# Patient Record
Sex: Male | Born: 1935 | Race: White | Hispanic: No | Marital: Married | Smoking: Former smoker
Health system: Southern US, Community
[De-identification: ages and names within clinical notes are randomized; demographics above are authoritative.]

## PROBLEM LIST (undated history)

## (undated) DIAGNOSIS — E785 Hyperlipidemia, unspecified: Secondary | ICD-10-CM

## (undated) DIAGNOSIS — I4891 Unspecified atrial fibrillation: Secondary | ICD-10-CM

## (undated) DIAGNOSIS — Z7901 Long term (current) use of anticoagulants: Secondary | ICD-10-CM

## (undated) DIAGNOSIS — Z66 Do not resuscitate: Secondary | ICD-10-CM

## (undated) DIAGNOSIS — N400 Enlarged prostate without lower urinary tract symptoms: Secondary | ICD-10-CM

## (undated) DIAGNOSIS — I495 Sick sinus syndrome: Secondary | ICD-10-CM

## (undated) DIAGNOSIS — E7800 Pure hypercholesterolemia, unspecified: Secondary | ICD-10-CM

## (undated) DIAGNOSIS — F0390 Unspecified dementia without behavioral disturbance: Secondary | ICD-10-CM

## (undated) DIAGNOSIS — H409 Unspecified glaucoma: Secondary | ICD-10-CM

## (undated) HISTORY — DX: Benign prostatic hyperplasia without lower urinary tract symptoms: N40.0

## (undated) HISTORY — PX: HERNIA REPAIR: SHX51

## (undated) HISTORY — DX: Unspecified atrial fibrillation: I48.91

## (undated) HISTORY — DX: Long term (current) use of anticoagulants: Z79.01

## (undated) HISTORY — DX: Sick sinus syndrome: I49.5

## (undated) HISTORY — DX: Hyperlipidemia, unspecified: E78.5

## (undated) HISTORY — PX: PACEMAKER INSERTION: SHX728

---

## 1984-08-02 HISTORY — PX: ABDOMINAL SURGERY: SHX537

## 2002-07-05 ENCOUNTER — Encounter: Payer: Self-pay | Admitting: General Surgery

## 2002-07-05 ENCOUNTER — Inpatient Hospital Stay (HOSPITAL_COMMUNITY): Admission: EM | Admit: 2002-07-05 | Discharge: 2002-07-20 | Payer: Self-pay | Admitting: Emergency Medicine

## 2002-07-05 ENCOUNTER — Encounter (INDEPENDENT_AMBULATORY_CARE_PROVIDER_SITE_OTHER): Payer: Self-pay | Admitting: Specialist

## 2002-07-06 ENCOUNTER — Encounter: Payer: Self-pay | Admitting: General Surgery

## 2002-07-09 ENCOUNTER — Encounter: Payer: Self-pay | Admitting: General Surgery

## 2002-07-11 ENCOUNTER — Encounter: Payer: Self-pay | Admitting: General Surgery

## 2002-07-13 ENCOUNTER — Encounter: Payer: Self-pay | Admitting: General Surgery

## 2002-07-14 ENCOUNTER — Encounter: Payer: Self-pay | Admitting: General Surgery

## 2002-07-16 ENCOUNTER — Encounter: Payer: Self-pay | Admitting: General Surgery

## 2003-03-14 ENCOUNTER — Encounter: Payer: Self-pay | Admitting: General Surgery

## 2003-03-14 ENCOUNTER — Inpatient Hospital Stay (HOSPITAL_COMMUNITY): Admission: RE | Admit: 2003-03-14 | Discharge: 2003-03-22 | Payer: Self-pay | Admitting: General Surgery

## 2003-03-19 ENCOUNTER — Encounter: Payer: Self-pay | Admitting: General Surgery

## 2005-08-02 DIAGNOSIS — I495 Sick sinus syndrome: Secondary | ICD-10-CM

## 2005-08-02 HISTORY — DX: Sick sinus syndrome: I49.5

## 2006-01-28 ENCOUNTER — Ambulatory Visit (HOSPITAL_COMMUNITY): Admission: RE | Admit: 2006-01-28 | Discharge: 2006-01-29 | Payer: Self-pay | Admitting: Cardiology

## 2008-11-13 ENCOUNTER — Emergency Department (HOSPITAL_COMMUNITY): Admission: EM | Admit: 2008-11-13 | Discharge: 2008-11-13 | Payer: Self-pay | Admitting: Emergency Medicine

## 2010-02-04 ENCOUNTER — Encounter: Payer: Self-pay | Admitting: Internal Medicine

## 2010-05-09 ENCOUNTER — Encounter: Payer: Self-pay | Admitting: Internal Medicine

## 2010-05-25 ENCOUNTER — Ambulatory Visit: Payer: Self-pay | Admitting: Internal Medicine

## 2010-07-01 ENCOUNTER — Encounter
Admission: RE | Admit: 2010-07-01 | Discharge: 2010-07-23 | Payer: Self-pay | Source: Home / Self Care | Attending: Orthopedic Surgery | Admitting: Orthopedic Surgery

## 2010-08-28 ENCOUNTER — Ambulatory Visit
Admission: RE | Admit: 2010-08-28 | Discharge: 2010-08-28 | Payer: Self-pay | Source: Home / Self Care | Attending: Internal Medicine | Admitting: Internal Medicine

## 2010-08-28 DIAGNOSIS — I4891 Unspecified atrial fibrillation: Secondary | ICD-10-CM | POA: Insufficient documentation

## 2010-08-28 DIAGNOSIS — I495 Sick sinus syndrome: Secondary | ICD-10-CM | POA: Insufficient documentation

## 2010-09-01 NOTE — Cardiovascular Report (Signed)
Summary: Office Visit   Office Visit   Imported By: Roderic Ovens 06/01/2010 16:07:21  _____________________________________________________________________  External Attachment:    Type:   Image     Comment:   External Document

## 2010-09-01 NOTE — Procedures (Signed)
Summary: pacer check/medtronic   Current Medications (verified): 1)  Digoxin 0.25 Mg Tabs (Digoxin) .... Take 1 Tablet By Mouth Once Daily 2)  Metoprolol Tartrate 25 Mg Tabs (Metoprolol Tartrate) .... Take 1 Tablet By Mouth Once Daily 3)  Simvastatin 40 Mg Tabs (Simvastatin) .... Take 1 Tablet By Mouth Once Daily 4)  Centrum Silver  Tabs (Multiple Vitamins-Minerals) .... Take 1 Tablet By Mouth Once Daily 5)  Fibercon 625 Mg Tabs (Calcium Polycarbophil) .... Take 1 Tablet By Mouth Once Daily 6)  Fish Oil 300 Mg Caps (Omega-3 Fatty Acids) .... Take 1 Capsule By Mouth Three Times A Day 7)  Aspir-Low 81 Mg Tbec (Aspirin) .... Take 1 Tablet By Mouth Once Daily  Allergies (verified): No Known Drug Allergies  PPM Specifications Following MD:  Hillis Range, MD     Referring MD:  Rolla Plate PPM Vendor:  Medtronic     PPM Model Number:  [id number]     PPM Serial Number:  [id number] PPM DOI:  01/28/2006     PPM Implanting MD:  Duffy Rhody Tennant,MD  Lead 1    Location: RV     DOI: 01/28/2006     Model #: 4470     Serial #: [id number]     Status: active  Magnet Response Rate:  BOL 85 ERI 65  Indications:  A-fib   PPM Follow Up Battery Voltage:  2.75 V     Battery Est. Longevity:  4 YRS       PPM Device Measurements Atrium  Threshold:   V at   msec Right Ventricle  Amplitude: 15.68 mV, Impedance: 524 ohms, Threshold: 0.50 V at 0.40 msec  Episodes MS Episodes:  0     Ventricular High Rate:  14     Atrial Pacing:  `     Ventricular Pacing:  77.3%  Parameters Mode:  VVIR     Lower Rate Limit:  60     Upper Rate Limit:  110 Next Cardiology Appt Due:  08/03/2010 Tech Comments:  14 VHR EPISODES--LONGEST WAS 27 SECONDS.  NORMAL DEVICE FUNCTION.  NO CHANGES MADE. ROV IN 3 MTHS W/JA.  PT ENROLLED IN [facility]--NEXT OV SCHEDULE FOR [facility] TRANSMISSION. Vella Kohler  May 25, 2010 10:13 AM

## 2010-09-01 NOTE — Miscellaneous (Signed)
Summary: Device preload  Clinical Lists Changes  Observations: Added new observation of PPM INDICATN: A-fib (05/09/2010 14:48) Added new observation of MAGNET RTE: BOL 85 ERI 65 (05/09/2010 14:48) Added new observation of PPMLEADSTAT1: active (05/09/2010 14:48) Added new observation of PPMLEADSER1: [id number]  (05/09/2010 14:48) Added new observation of PPMLEADMOD1: 4470  (05/09/2010 14:48) Added new observation of PPMLEADDOI1: 01/28/2006  (05/09/2010 14:48) Added new observation of PPMLEADLOC1: RV  (05/09/2010 14:48) Added new observation of PPM IMP MD: Duffy Rhody Tennant,MD  (05/09/2010 14:48) Added new observation of PPM DOI: 01/28/2006  (05/09/2010 14:48) Added new observation of PPM SERL#: [id number]  (05/09/2010 14:48) Added new observation of PPM MODL#: [id number]  (05/09/2010 84:69) Added new observation of PACEMAKERMFG: Medtronic  (05/09/2010 14:48) Added new observation of PPM REFER MD: Duffy Rhody Tennant,MD  (05/09/2010 14:48) Added new observation of PACEMAKER MD: Hillis Range, MD  (05/09/2010 14:48)      PPM Specifications Following MD:  Hillis Range, MD     Referring MD:  Rolla Plate PPM Vendor:  Medtronic     PPM Model Number:  [id number]     PPM Serial Number:  [id number] PPM DOI:  01/28/2006     PPM Implanting MD:  Rolla Plate  Lead 1    Location: RV     DOI: 01/28/2006     Model #: 4470     Serial #: [id number]     Status: active  Magnet Response Rate:  BOL 85 ERI 65  Indications:  A-fib

## 2010-09-03 NOTE — Assessment & Plan Note (Signed)
Summary: device/saf   Visit Type:  Pacemaker check Referring Provider:  Dr Deborah Chalk Primary Provider:  Dr Gilmore Laroche   History of Present Illness: [name] is a pleasant 77 yo WM with a h/o permanent atrial fibrillation and tachy/brady syndrome.  He reports doing very well s/p PPM.  Unfortunately, he has significant unsteadiness requiring a leg brace.  He has had multiple falls.  For this reason, he is not felt to be a candidate for coumadin. The patient denies symptoms of palpitations, chest pain, shortness of breath, orthopnea, PND, lower extremity edema,  presyncope, syncope, or neurologic sequela. The patient is tolerating medications without difficulties and is otherwise without complaint today.   Current Medications (verified): 1)  Digoxin 0.25 Mg Tabs (Digoxin) .... Take 1 Tablet By Mouth Once Daily 2)  Metoprolol Tartrate 25 Mg Tabs (Metoprolol Tartrate) .... Take 1 Tablet By Mouth Once Daily 3)  Simvastatin 40 Mg Tabs (Simvastatin) .... Take 1 Tablet By Mouth Once Daily 4)  Centrum Silver  Tabs (Multiple Vitamins-Minerals) .... Take 1 Tablet By Mouth Once Daily 5)  Fibercon 625 Mg Tabs (Calcium Polycarbophil) .... Take 1 Tablet By Mouth Once Daily 6)  Fish Oil 300 Mg Caps (Omega-3 Fatty Acids) .... Take 1 Capsule By Mouth Three Times A Day 7)  Aspir-Low 81 Mg Tbec (Aspirin) .... Take 1 Tablet By Mouth Once Daily 8)  Acetaminophen 650 Mg  Tbcr (Acetaminophen) .... 2 Tabs Two Times A Day  Allergies (verified): No Known Drug Allergies  Past History:  Past Medical History:  1.  Permanent  atrial fibrillation.  2.  coumadin stoppped due to falls/ unsteadiness.  3.  Remote history of bilateral breast tumors.  4.  Remote history of abdominal surgery in 1986.  5.  Generalized arthritis.  6.  Hyperlipidemia.  7.  History of hernia repair.  8. tachy/brady s/p PPM  Past Surgical History:  History of hernia repair Remote history of abdominal surgery in 1986. s/p PPM (MDT) by Dr  Deborah Chalk 2007  Social History: Reviewed history from 08/28/2010 and no changes required.  He is a retired Chart[employer] loss adjuster.  He has had no current  alcohol or tobacco use.  He does have a remote history of tobacco abuse, yet  none over the last 50 years.  Review of Systems       All systems are reviewed and negative except as listed in the HPI.   Vital Signs:  Patient profile:   77 year old male Height:      69 inches Weight:      163 pounds BMI:     24.16 Pulse rate:   65 / minute BP sitting:   122 / 66  (left arm) Cuff size:   regular  Vitals Entered By: Hardin Negus, RMA (August 28, 2010 11:08 AM)  Physical Exam  General:  thin elderly male, NAD Head:  normocephalic and atraumatic Eyes:  PERRLA/EOM intact; conjunctiva and lids normal. Mouth:  Teeth, gums and palate normal. Oral mucosa normal. Neck:  supple Chest Wall:  pacemaker pocket is well  healed Lungs:  Clear bilaterally to auscultation and percussion. Heart:  RRR (Paced) Abdomen:  Bowel sounds positive; abdomen soft and non-tender without masses, organomegaly, or hernias noted. No hepatosplenomegaly. Msk:  wears brace on leg Extremities:  No clubbing or cyanosis. Neurologic:  Alert and oriented x 3. Skin:  Intact without lesions or rashes.   PPM Specifications Following MD:  Hillis Range, MD     Referring MD:  Duffy Rhody  Tennant,MD PPM Vendor:  Medtronic     PPM Model Number:  [id number]     PPM Serial Number:  [id number] H PPM DOI:  01/28/2006     PPM Implanting MD:  Duffy Rhody Tennant,MD  Lead 1    Location: RV     DOI: 01/28/2006     Model #: 4470     Serial #: [id number]     Status: active  Magnet Response Rate:  BOL 85 ERI 65  Indications:  A-fib   PPM Follow Up Battery Voltage:  2.75 V     Battery Est. Longevity:  3 yrs     Right Ventricle  Amplitude: 11.20 mV, Impedance: 510 ohms, Threshold: 0.50 V at 0.40 msec  Episodes Ventricular High Rate:  2     Ventricular Pacing:  75.2%  Parameters Mode:  VVIR      Lower Rate Limit:  60     Upper Rate Limit:  110 Next Remote Date:  11/26/2010     Next Cardiology Appt Due:  08/03/2011 Tech Comments:  2 VHR EPISODE--LASTING 7 SECONDS.  NORMAL DEVICE FUNCTION.  NO CHANGES MADE. [facility] 11-26-10 AND ROV IN 12 MTHS W/JA. Vella Kohler  August 28, 2010 11:24 AM MD Comments:  agree [facility] are afib with RVR V rates are mostly controlled  Impression & Recommendations:  Problem # 1:  ATRIAL FIBRILLATION (ICD-427.31) stable not presently a coumadin candidate due to falls and unsteadiness V rates are stable  Problem # 2:  BRADYCARDIA-TACHYCARDIA SYNDROME (ICD-427.81) normal pacemaker function  no changes  Problem # 3:  HYPERLIPIDEMIA (ICD-272.4) stable     Simvastatin 40 Mg Tabs (Simvastatin) .Marland Kitchen... Take 1 tablet by mouth once daily  Patient Instructions: 1)  Your physician wants you to follow-up in: 12 months with Dr Jacquiline Doe will receive a reminder letter in the mail two months in advance. If you don't receive a letter, please call our office to schedule the follow-up appointment. 2)  Your physician recommends that you continue on your current medications as directed. Please refer to the Current Medication list given to you today. 3)  [facility] 11/26/10

## 2010-09-23 NOTE — Letter (Signed)
Summary: [facility]: Progress Note  [facility]: Progress Note   Imported By: Earl Many 09/14/2010 11:50:38  _____________________________________________________________________  External Attachment:    Type:   Image     Comment:   External Document

## 2010-11-11 LAB — CBC
Hemoglobin: 9.7 g/dL — ABNORMAL LOW (ref 13.0–17.0)
MCV: 98.5 fL (ref 78.0–100.0)
Platelets: 147 10*3/uL — ABNORMAL LOW (ref 150–400)
RBC: 2.93 MIL/uL — ABNORMAL LOW (ref 4.22–5.81)
RDW: 13.6 % (ref 11.5–15.5)

## 2010-11-11 LAB — POCT CARDIAC MARKERS: Myoglobin, poc: 99 ng/mL (ref 12–200)

## 2010-11-11 LAB — DIFFERENTIAL
Basophils Absolute: 0 10*3/uL (ref 0.0–0.1)
Eosinophils Relative: 1 % (ref 0–5)
Lymphocytes Relative: 5 % — ABNORMAL LOW (ref 12–46)
Monocytes Absolute: 0.5 10*3/uL (ref 0.1–1.0)
Monocytes Relative: 5 % (ref 3–12)
Neutro Abs: 8.4 10*3/uL — ABNORMAL HIGH (ref 1.7–7.7)

## 2010-11-11 LAB — TYPE AND SCREEN: ABO/RH(D): O POS

## 2010-11-11 LAB — BASIC METABOLIC PANEL
Calcium: 8.7 mg/dL (ref 8.4–10.5)
Creatinine, Ser: 1.16 mg/dL (ref 0.4–1.5)
Sodium: 141 mEq/L (ref 135–145)

## 2010-11-11 LAB — APTT: aPTT: 22 seconds — ABNORMAL LOW (ref 24–37)

## 2010-11-26 ENCOUNTER — Encounter: Payer: Self-pay | Admitting: *Deleted

## 2010-11-29 ENCOUNTER — Encounter: Payer: Self-pay | Admitting: *Deleted

## 2010-12-02 ENCOUNTER — Other Ambulatory Visit: Payer: Self-pay | Admitting: Internal Medicine

## 2010-12-03 ENCOUNTER — Ambulatory Visit (INDEPENDENT_AMBULATORY_CARE_PROVIDER_SITE_OTHER): Payer: Medicare Other | Admitting: *Deleted

## 2010-12-03 DIAGNOSIS — I4891 Unspecified atrial fibrillation: Secondary | ICD-10-CM

## 2010-12-03 DIAGNOSIS — I495 Sick sinus syndrome: Secondary | ICD-10-CM

## 2010-12-09 ENCOUNTER — Encounter: Payer: Self-pay | Admitting: *Deleted

## 2010-12-10 NOTE — Progress Notes (Signed)
Pacer remote check  

## 2010-12-18 NOTE — Cardiovascular Report (Signed)
NAMERASUL, DECOLA NO.:  [id number]   MEDICAL RECORD NO.:  [id number]          PATIENT TYPE:  OIB   LOCATION:  2807                         FACILITY:  [facility]   PHYSICIAN:  Colleen Can. Deborah Chalk, M.D.DATE OF BIRTH:  [birth year]   DATE OF PROCEDURE:  01/28/2006  DATE OF DISCHARGE:                              CARDIAC CATHETERIZATION   PROCEDURE:  Implantation of a single chamber pulse generator under  fluoroscopy.   INDICATION FOR PROCEDURE:  Chronic atrial fibrillation with intermittent  pauses greater than three seconds and bradycardia.   PROCEDURE:  The right subclavicular area was prepped and draped.  The area  was infiltrated with 1% Xylocaine.  A single puncture was made into the  right subclavian vein over top of the first rib.  Using a 7-French Cook  introducer, the ventricular lead was introduced.  The ventricular lead was a  Guidant model 4470 52 cm active fixation lead serial #[id number].  Thresholds were 0.6 volts to capture, 0.5 milliseconds pulse width.  R-waves  were 9.9 mV and impedance was 764 ohms.  Current was 0.9 MA.  The lead was  sutured in place.  The lead was placed on the septum.  We initially had two  different locations in the right ventricular apex but we had R-waves that  were less than 5 mV at that level and at this septum we had excellent  parameters on all aspects of the lead.  The lead was sutured in place.  The  wound was flushed with kanamycin solution.  The lead was then connected to a  Medtronic Adapta ADSRO1 serial [id number].  The unit was sutured in place.  The wound was closed with 2-0 and subsequently 4-0 Vicryl.  Steri-Strips  were applied.  The patient tolerated the procedure well.      Colleen Can. Deborah Chalk, M.D.  Electronically Signed     SNT/MEDQ  D:  01/28/2006  T:  01/28/2006  Job:  [id number]   cc:   [facility]

## 2010-12-18 NOTE — Op Note (Signed)
[name], [name]                       ACCOUNT NO.:  [id number]   MEDICAL RECORD NO.:  [id number]                   PATIENT TYPE:  INP   LOCATION:  5703                                 FACILITY:  [facility]   PHYSICIAN:  Jimmye Norman III, M.D.               DATE OF BIRTH:  [birth year]   DATE OF PROCEDURE:  03/15/2003  DATE OF DISCHARGE:                                 OPERATIVE REPORT   PREOPERATIVE DIAGNOSIS:  Recurrent right inguinal hernia.   POSTOPERATIVE DIAGNOSIS:  Recurrent direct right inguinal hernia.   PROCEDURE:  Repair of recurrent direct inguinal hernia using double-layered  Kugel mesh.   SURGEON:  Jimmye Norman, M.D.   ASSISTANT:  Gita Kudo, M.D.   ANESTHESIA:  General endotracheal.   ESTIMATED BLOOD LOSS:  50 mL.   COMPLICATIONS:  None.   CONDITION:  Stable.   FINDINGS:  The patient had a large recurrent direct hernia, which dissected  down into the subcu on the right inguinal area.  He had no incarcerated  bowel.  The spermatic cord appeared to be intact and viable.  Previous  repair, this had just bulged through the previous repair, and there was no  mesh from the previous repair.   INDICATION FOR OPERATION:  The patient is a 77 year old with significant  cardiac disease, who is on Coumadin chronically, who had a previous  incarcerated hernia repaired with some difficulty.  No mesh was implanted at  that time, but he was taken to the operating room now for repair.   OPERATION:  The patient was taken to the operating room and placed on the  table in the supine position.  After an adequate endotracheal anesthetic was  administered, he was prepped and draped in the usual sterile manner,  exposing the right groin.   We went through the previous right diagonal incision over the hernia on the  right side.  We took it down into the subcu and then subsequently down to  the level of the deep scar tissue and the recurrent hernia using  electrocautery  primarily.  We entered a small area laterally of the hernia  sac, which allowed [place] to dissect out the sac from the surrounding  subcutaneous tissue and then subsequently more medially from the spermatic  cord.  We enclosed the spermatic cord in a Penrose drain and as we retracted  away, we dissected away from the hernia sac completely.   We circumferentially dissected away the hernia sac from the surrounding  subcutaneous tissue and then once we had done so,we resected the sac and  sutured it closed using a running 0 Prolene suture.  We then detached the  preperitoneal area of the hernia sac from the underlying conjoined tendon  superomedially and what was left of the inferolateral ligament using  Metzenbaum scissors and electrocautery.  This allowed [place] to develop a  circumferential plane underneath the spermatic cord  in which the double-  layered Kugel mesh was placed.  We attached it to Cooper's ligament sort of  inferomedially away from those vessels as we palpated the vessels during the  repair.  It was tacked down using an 0 Prolene suture.  We then cut off the  loop or the tag of the double mesh and then tacked it down superomedially  using the 0 Prolene suture, attaching it to the transversalis fascia and  superolaterally, also with an 0 Prolene suture.  We poured some antibiotic  ointment into the canal to provide for some coverage during that.  Then we  closed the conjoined tendon down to scarred tendon down inferolaterally  using a running 0 Prolene suture.  Once this was done we placed an onlay  piece of mesh on top of the repaired fascial closure using tacking sutures  of 0 Prolene.  This was done in four quadrants, tacking it to the pubic  tubercle medially.  When this was done we closed the external oblique fascia  and scar on top of the second layer of mesh using a running 2-0 Vicryl  suture, and then Scarpa's fascia was reapproximated using interrupted 3-0  Vicryl sutures.   We injected 0.25% Marcaine without epinephrine into the  subcutaneous tissue and into the wound, closing the skin using a running  subcuticular stitch of 4-0 Vicryl.  All needle counts, sponge counts, and  instrument counts were correct.  The patient was taken to the recovery room  in stable condition.  Sterile dressing was applied.                                               Kathrin Ruddy, M.D.    JW/MEDQ  D:  03/15/2003  T:  03/15/2003  Job:  [id number]

## 2010-12-18 NOTE — Op Note (Signed)
[name], [name]                       ACCOUNT NO.:  [id number]   MEDICAL RECORD NO.:  [id number]                   PATIENT TYPE:  INP   LOCATION:  3305                                 FACILITY:  [facility]   PHYSICIAN:  Jimmye Norman III, M.D.               DATE OF BIRTH:  [birth year]   DATE OF PROCEDURE:  07/05/2002  DATE OF DISCHARGE:                                 OPERATIVE REPORT   PREOPERATIVE DIAGNOSIS:  Incarcerated right inguinal hernia with possible  infarcted bowel.   POSTOPERATIVE DIAGNOSIS:  Incarcerated right inguinal hernia with ischemic  and infarcted small bowel.   PROCEDURE:  1. Small bowel resection with anastomosis.  2. Right inguinal hernia repair without mesh, modified McVay's type repair.   SURGEON:  Jimmye Norman, M.D.   ASSISTANT:  Gabrielle Dare. Janee Morn, M.D.   ANESTHESIA:  General.   ESTIMATED BLOOD LOSS:  100 to 150 cc.   COMPLICATIONS:  Rupture of dilated ischemic small bowel.   CONDITION:  Fair.   SPECIMENS:  Resected small bowel which was infarcted.   DISPOSITION:  To PACU then to 3300 or intermediate care unit.   INDICATIONS FOR PROCEDURE:  The patient is a 76 year old gentleman with a  significant cardiac history on digoxin, multiple antihypertensives, and  Coumadin who came in with an INR of over 2 and an incarcerated right  inguinal hernia which could not be reduced in the emergency department.  He  was admitted and the anticoagulation was reversed as we prepared him for  surgical resection.   FINDINGS:  The patient had a distal knuckle of distal small bowel which was  completely infarcted and trapped in the right inguinal hernia which was a  direct type hernia lateral to the spermatic cord.  The fascial planes were  not well defined during the procedure as it seems as though the patient had  had a repair where the spermatic cord was left above the external oblique  fascia.  There was no mesh identified during this repair.  The small  bowel  involved was distal small bowel, likely proximal ileum.   OPERATION:  The patient was taken to the operating room and placed on the  table in supine position.  After an adequate endotracheal anesthetic was  administered, he was prepped and draped in the usual sterile manner exposing  the midline and the right lower quadrant.  We made an incision paralleling  what would have been Poupart's ligament over in the right lower quadrant  down into the subcutaneous tissue where we subsequently ran into the large  bulging hernia which you could see through the soft tissue was ecchymotic.  We dissected out the hernia sac itself and found that it was a large sac  measuring approximately 8 cm in size with some necrotic bloody fluid  contained within the sac, itself.  We opened the sac and found there was an  ischemic, almost completely  infarcted knuckle of small bowel which we had to  resect.  We opened the hernia for the full extent of the incision actually  opening it down almost to the internal ring, however, care had to be taken  to not lose control of the inferior epigastric vessels or damage the femoral  vessels.  We could palpate the femoral vessels inferolaterally and ligate  the inferior epigastrics between [employer] clamps and 2-0 silk ties.  Once this  was done, we could open up the inguinal floor which also had opened peroneum  with the infarcted bowel contained within.   We had to mobilize the small bowel into the wound through the exposed hernia  and the hernia sac.  There were multiple adhesions keeping the small bowel  tethered down into the peritoneum and as we were trying to mobilize this  into the wound to perform a resection and a primary anastomosis, the small  bowel tore in an infarcted area and spilled some not infected smelling but  thickened secretions into the wound.  We aspirated this quickly and then  subsequently gained more proximal control having to remove more  proximal  loops and open up some adhesions.  We subsequently resected approximately 8  inches of small bowel and did a handsewn anastomosis using 0 silk Lembert  stitches, 3-0 Vicryl full thickness Connell stitches, and then an anterior  border of 3-0 silk Lembert stitches.  The mesentery was closed using 3-0  silk sutures.  We then allowed the anastomosed bowel to fall back into the  peritoneal cavity where we subsequently irrigated with a liter of warm  saline solution.  We then closed the peritoneum using a running 3-0 Vicryl  suture just to keep the bowel contents within.  However, there were, again,  very few fascial planes to identify exactly where we would sew, however, we  could identify the conjoined tendon anteromedially which we did suture down  to on the medial aspect to Coopers ligament using interrupted #1 Prolene  sutures.  We took that conjoined tendon to Coopers ligament up to close to  the femoral vessels and then subsequently made a transition patching the  conjoined tendon and also probably part of the external oblique fascia to  what appeared to be residual Poupart's ligament lateral to the femoral  vessels.  This was also done with interrupted #1 Prolene sutures.   Once we did this part of the repair, we copiously irrigated with saline,  reapproximated the Scarpa's fascia using interrupted 3-0 Vicryl and then the  skin was closed loosely using stainless steel staples.  Antibiotic ointment  was applied to the wound and a sterile dressing applied.  We subsequently  tried to pass an NG tube in the patient which was unsuccessful.  We stopped  because of fear of bleeding.                                               Kathrin Ruddy, M.D.    JW/MEDQ  D:  07/05/2002  T:  07/05/2002  Job:  [id number]

## 2010-12-18 NOTE — H&P (Signed)
NAMEFRANK, NOVELO NO.:  [id number]   MEDICAL RECORD NO.:  [id number]            PATIENT TYPE:   LOCATION:                                 FACILITY:   PHYSICIAN:  Colleen Can. Deborah Chalk, M.D.    DATE OF BIRTH:   DATE OF ADMISSION:  01/28/2006  DATE OF DISCHARGE:                                HISTORY & PHYSICAL   CHIEF COMPLAINT:  None.   HISTORY OF PRESENT ILLNESS:  Mr. Vanderwoude is a very pleasant 77 year old white  male.  He has had longstanding atrial fibrillation, has been maintained on  chronic Coumadin with rate control.  He has had some degree of unsteadiness  and fatigue, but overall has been doing well.  He has had no frank syncope,  no complaints of chest pain or shortness of breath.  He had a recent Holter  monitor performed which showed profound pauses with periods also of  increased heart rate.  Overall, he is felt to have tachybrady syndrome.  He  is now referred for pacemaker implantation.   PAST MEDICAL HISTORY:  1.  Chronic atrial fibrillation.  2.  Chronic Coumadin.  3.  Remote history of bilateral breast tumors.  4.  Remote history of abdominal surgery in 1986.  5.  Generalized arthritis.  6.  Hyperlipidemia.  7.  History of hernia repair.   ALLERGIES:  None.   CURRENT MEDICATIONS:  1.  Simvastatin 80 mg a day.  2.  Tylenol p.r.n.  3.  Glucosamine p.r.n.  4.  Fish oil three times a day.  5.  Fibercon daily.  6.  Multivitamin daily.  7.  Toprol XL 25 mg a day.  8.  Lopid 600 b.i.d.  9.  Coumadin as directed.  10. Lanoxin 0.25 daily.   FAMILY HISTORY:  Father died at 77 with questionable heart attack.  Mother  died at age 50.   SOCIAL HISTORY:  He is a retired Chartered loss adjuster.  He has had no current  alcohol or tobacco use.  He does have a remote history of tobacco abuse, yet  none over the last 50 years.   REVIEW OF SYSTEMS:  Basically as noted above and is otherwise unremarkable.   PHYSICAL EXAMINATION:  GENERAL:  He is a very  pleasant, elderly white male  in no acute distress.  VITAL SIGNS:  Weight is 184 pounds, blood pressure 160/80 sitting, 150/80  standing.  Heart rate is 100 and irregular, respirations 18.  He is  afebrile.  SKIN:  Warm and dry.  Color is unremarkable.  LUNGS:  Clear.  HEART:  Irregular irregular rhythm.  ABDOMEN:  Soft.  Positive bowel sounds.  Nontender.  EXTREMITIES:  Edema.  NEUROLOGIC:  Intact.  There are no gross focal deficits.   PERTINENT LABORATORIES:  His EKG shows atrial fibrillation.  His INR is  2.34.  CBC is normal.  Chemistries are normal except for a BUN of 23.  His  potassium is 4.6.   OVERALL IMPRESSION:  1.  Chronic atrial fibrillation now demonstrating tachybrady syndrome with      profound pauses on recent  Holter monitor.  2.  Chronic Coumadin.  3.  Hyperlipidemia.   PLAN:  We will proceed on with single chamber pacemaker implantation.  The  procedure has been reviewed in full detail with both him and his wife  including the risks and benefits and he is willing to proceed on Friday,  January 28, 2006.      Sharlee Blew, N.P.      Colleen Can. Deborah Chalk, M.D.  Electronically Signed    LC/MEDQ  D:  01/26/2006  T:  01/26/2006  Job:  [id number]

## 2010-12-18 NOTE — Discharge Summary (Signed)
[name], [name] NO.:  [id number]   MEDICAL RECORD NO.:  [id number]          PATIENT TYPE:  OIB   LOCATION:  2018                         FACILITY:  [facility]   PHYSICIAN:  Sharlee Blew, N.P.  DATE OF BIRTH:  06-16-1923   DATE OF ADMISSION:  01/28/2006  DATE OF DISCHARGE:                                 DISCHARGE SUMMARY   PRIMARY DISCHARGE DIAGNOSIS:  Chronic atrial fibrillation with tachy-brady  syndrome status post single chamber pacemaker implantation with an Adapta  ADSRO 1 serial number [id number].   SECONDARY DISCHARGE DIAGNOSES:  1.  Chronic atrial fibrillation.  2.  Chronic Coumadin.  3.  Hyperlipidemia.   HISTORY OF PRESENT ILLNESS:  The patient is a very pleasant 77 year old  white male.  He has had a longstanding history of atrial fibrillation.  He  has been maintained on chronic Coumadin with rate control.  He has had a  recent Holter monitor which shows significant pauses as well as some periods  of increased heart rate.  Overall he was felt to have tachy-brady syndrome  and he was referred for single chamber pacemaker implantation.  He has had  no frank syncope and has basically been asymptomatic. Please see the History  and Physical for further patient presentation and profile.   LABORATORY DATA ON ADMISSION:  His INR was 2.34 and after Coumadin was held  for 3 days he was down to 1.2.  His CBC was normal.  His chemistries were  normal except for a BUN of 23.   HOSPITAL COURSE:  The patient was admitted electively on January 28, 2006 for  single chamber pacemaker implantation.  That procedure was performed without  any known complications.  A Medtronic single chamber ADSR01 serial number  [id number] was implanted.  The overall procedure was tolerated well and  postprocedure he was transferred to 2000.  Today on January 29, 2006 he is  doing well without complaints.  The pacemaker wound is satisfactory.  He is  currently V-paced with underlying  atrial fibrillation.  He is felt to be a  satisfactory candidate for discharge today.   DISCHARGE CONDITION:  Stable.   DISCHARGE MEDICATIONS:  He will resume all of his previous home medicines as  he was taking before which includes:  1.  Lanoxin 0.25 daily.  2.  Lopid 600 b.i.d.  3.  Toprol XL 25 mg a day.  4.  Multivitamin daily.  5.  Zocor 80 a day.  6.  Coumadin 2 mg on Sundays, 1-1/2 mg the other 6 days.  7.  He is to use Tylenol as need for discomfort.   DISCHARGE INSTRUCTIONS:  Extensive written instructions are given regarding  pacemaker care specifically not to raise the right arm above his head for  the next two weeks.  He is to avoid getting the wound wet for the first week  as well.  Betadine swabs will be made available to him to paint the wound  twice a day for the next 3 days.  We will plan on following up with him in  the office in approximately 10 days.  Certainly sooner if problems arise.      Sharlee Blew, N.P.     LC/MEDQ  D:  01/29/2006  T:  01/29/2006  Job:  [phone]   cc:   [facility]  [address] [place], [place] [address]

## 2010-12-18 NOTE — Discharge Summary (Signed)
[name], [name]                       ACCOUNT NO.:  [id number]   MEDICAL RECORD NO.:  [id number]                   PATIENT TYPE:  INP   LOCATION:  3709                                 FACILITY:  [facility]   PHYSICIAN:  Jimmye Norman III, M.D.               DATE OF BIRTH:  1923-04-03   DATE OF ADMISSION:  07/04/2002  DATE OF DISCHARGE:  07/20/2002                                 DISCHARGE SUMMARY   DISCHARGE DIAGNOSES:  1. Strangulating obstructing hernia in the right groin.  2. Postoperative ileus.  3. Atrial fibrillation.  4. Clostridium difficile colonic infection.  5. Urinary tract infection with Pseudomonas.   PRINCIPAL PROCEDURE:  Right inguinal hernia repair without mesh, and a small  bowel resection.   SURGEON:  Jimmye Norman, M.D.   DISCHARGE MEDICATIONS:  1. Ciprofloxacin for five days after discharge.  2. Flagyl for an additional five days after discharge.   CONDITION ON DISCHARGE:  Stable.   HOSPITAL COURSE:  The patient was admitted on July 04, 2002, with an  incarcerated right inguinal hernia.  He was on Coumadin and his INR was at  an unacceptable level for approximately 12-18 hours prior to surgery, at  which time he did receive 4 units of FFP and some vitamin K.  He  subsequently underwent an operation, at which time he had to have a small  bowel resection performed because of ischemia and infarction of an 8 cm  anterior segment of small bowel.  Postoperatively the patient was off his  anticoagulation and developed a profound abdominal ileus, requiring the  placement of an NG tube, which could not be passed at the time of surgery.  His hernia was healing well and he had no evidence of hematoma formation  actually until he actually had to be started back on heparin because of his  atrial fibrillation.  This was done on postoperative day number seven.  At  that time the patient had been on TNA since the first postoperative day, at  which time he had a PIC  line placed for TNA.  He continued to be on  nutrition throughout his course of abdominal ileus, which resolved on  postoperative day number seven, at which time he started to take liquids.  The NG tube was removed after clamping it for 24 hours, with success.  It  was noted that the patient had prominent diarrhea postoperatively.  C.  difficile toxin titer was sent which was positive.  He was started on oral  and IV Flagyl, with the IV Flagyl being subsequently discontinued.  He  continues to be on Flagyl and he will complete a 12-day course of the  antibiotic.  He was also noted to have fevers and white counts escalating  after the initial defervescence, with a white count up to 24,000.  Currently  on the day of discharge it is down to 15.8, but he does still  have a low  shift and some mild anemia, possibly secondary to bleeding into his groin  from being on heparin.  At the time the fever workup demonstrated a gram-  negative Pseudomonas urinary tract infection.  He was started on appropriate  antibiotics.  The patient did require 2 units of blood postoperatively,  possibly because of bleeding into his groin and scrotum area from having to  be on heparin.  That has stabilized and he should take iron and vitamins at  home, to continue to replace his blood products.    DISPOSITION:  He will be discharged home in the care of his family.  Cardiology service, Dr. Colleen Can. Deborah Chalk, has seen the patient during the  hospitalization.  They will follow his prothrombin time at home.  He will  start on the dose that he was taking prior to admission.   FOLLOW UP:  I will see him in two weeks, otherwise there are no other  changes.                                                Kathrin Ruddy, M.D.    JW/MEDQ  D:  07/20/2002  T:  07/21/2002  Job:  [id number]

## 2010-12-18 NOTE — Discharge Summary (Signed)
[name], [name]                       ACCOUNT NO.:  [id number]   MEDICAL RECORD NO.:  [id number]                   PATIENT TYPE:  INP   LOCATION:  5703                                 FACILITY:  [facility]   PHYSICIAN:  Jimmye Norman III, M.D.               DATE OF BIRTH:  07-Dec-1922   DATE OF ADMISSION:  03/14/2003  DATE OF DISCHARGE:  03/22/2003                                 DISCHARGE SUMMARY   DISCHARGE DIAGNOSES:  1. Recurrent right inguinal hernia.  2. Postoperative hemorrhage with hematoma in the right groin, right flank     and the perineal scrotal area.  3. Atrial fibrillation.  4. Coagulopathy.   DISCHARGE MEDICATIONS:  Patient is to restart his Coumadin as he was taking  previously, which is 2.5 mg a day.   DIET:  Regular.   CONDITION ON DISCHARGE:  Stable. He has had bowel movements prior to  discharge.   FOLLOW UP:  He is to return to see me on Tuesday, August 24th. We will  recheck his wound at that time.  He can shower when he gets home, but no  heavy lifting.   HOSPITAL COURSE:  The patient was brought in the day prior to his surgical  procedure for correction of his coagulopathy, secondary to Coumadin therapy.  On admission, he had an INR of greater than 2.  He was given 2 units of FFP  and 10 mg of subcutaneous vitamin K prior to surgery on the following day.  His INR was 1.4 the day of surgery, PTT was 42.  His heparin had been cut  off.  He went to surgery, at which time he had repair of recurrent right  inguinal hernia which had very difficult tissues to sew onto. A Kugel double  layered patch of mesh was placed in the wound. Because of the recurrent  nature, there was a fair amount of bleeding at the time, which actually was  exacerbated postoperatively because of his coagulopathy, and the need to try  to place the patient back on heparin.   Orders were written to hold the heparin until the morning following the  procedure.  He did have  significant blood loss postoperatively. His  hemoglobin originally dropped down into the 9 range, and then subsequently  went down to 8.5.  At that time, he received 2 units of blood, and he has  not received any further blood since that time. His hemoglobin subsequently  came up to 10, and then up to 11.5; which is the hemoglobin today. He was  started back on heparin on postoperative day #3.  Inspite of that, his  hemoglobin has remained fine once he has received blood. He has only  received one 5 mg tablet of Coumadin.  He is to resume his Coumadin therapy  which he was receiving at home.   His platelet counts have improved considerably.  He has had  a considerable  amount of discomfort secondary to the hematoma, which has been managed with  IV pain medicine and also Vicodin. There was no neuropathic type pain, and  this should resolve once the significant hematoma in the right groin, right  flank and the scrotum has resolved.  No compressive devices are necessary,  we have just supported it with scrotal support.   He has dissolvable 4-0 Vicryl stitches, which do not require removal. At  this time, he is to visit me on Tuesday, just to observe the hematoma and  his wound.   The day of discharge, we are checking his INR.  Hopefully, that will be well  enough for him to go home and not too high.  He is to resume Coumadin as  taken previously.  He is to call me for any problems that he should have  over the weekend. He was given Vicodin to take home.                                                Kathrin Ruddy, M.D.    JW/MEDQ  D:  03/22/2003  T:  03/23/2003  Job:  [id number]   cc:   Clydie Braun L. Hal Hope, M.D.  [address] [place]  [place] [address]  Fax: [phone]

## 2010-12-21 ENCOUNTER — Ambulatory Visit: Payer: Self-pay | Admitting: Cardiology

## 2010-12-22 ENCOUNTER — Encounter: Payer: Self-pay | Admitting: *Deleted

## 2010-12-22 ENCOUNTER — Ambulatory Visit (INDEPENDENT_AMBULATORY_CARE_PROVIDER_SITE_OTHER): Payer: Medicare Other | Admitting: Cardiology

## 2010-12-22 ENCOUNTER — Encounter: Payer: Self-pay | Admitting: Cardiology

## 2010-12-22 DIAGNOSIS — Z950 Presence of cardiac pacemaker: Secondary | ICD-10-CM | POA: Insufficient documentation

## 2010-12-22 DIAGNOSIS — I4891 Unspecified atrial fibrillation: Secondary | ICD-10-CM

## 2010-12-22 NOTE — Assessment & Plan Note (Signed)
He will remain in atrial fibrillation is felt that Coumadin anticoagulation is too risky because of his unsteadiness and frequent falls. He is on digoxin metoprolol to control rate. He has a VVI pacemaker.

## 2010-12-22 NOTE — Assessment & Plan Note (Signed)
Permanent pacemaker placed in 2007.  followup per Dr. Johney Frame.

## 2010-12-22 NOTE — Progress Notes (Signed)
Subjective:   [name] is seen today for followup visit. He has had minor falls without any major injury. 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  He has a history of atrial fibrillation dating back to 65. In 1999, we felt his atrial fibrillation would be a stable chronic rhythm and he's been managed on warfarin since that time. In January 2010, Coumadin was stopped because of his unsteadiness. He continues to have his VVI pacemaker monitored a day pacer clinic. Pacer was placed in June of 2007. He has a history of hyperlipemia managed on statin therapy.  In general, he has been doing well without significant complaints.  Current Outpatient Prescriptions  Medication Sig Dispense Refill  . acetaminophen (TYLENOL) 500 MG tablet Take 500 mg by mouth as needed.        . digoxin (LANOXIN) 0.25 MG tablet Take 250 mcg by mouth daily.        Marland Kitchen loratadine (CLARITIN) 10 MG tablet Take 10 mg by mouth daily.        . metoprolol succinate (TOPROL-XL) 25 MG 24 hr tablet Take 25 mg by mouth daily.        . multivitamin (THERAGRAN) per tablet Take 1 tablet by mouth daily.        . Omega-3 Fatty Acids (FISH OIL) 1000 MG CAPS Take 3 capsules by mouth daily.        . polycarbophil (FIBERCON) 625 MG tablet Take 625 mg by mouth daily.        . simvastatin (ZOCOR) 40 MG tablet Take 40 mg by mouth at bedtime.        Marland Kitchen DISCONTD: atorvastatin (LIPITOR) 40 MG tablet Take 40 mg by mouth daily.        Marland Kitchen DISCONTD: GLUCOSAMINE PO Take 1 tablet by mouth as needed.        Marland Kitchen DISCONTD: warfarin (COUMADIN) 1 MG tablet Take 1 mg by mouth. as directed by the Coumadin Clinic         No Known Allergies  Patient Active Problem List  Diagnoses  . HYPERLIPIDEMIA  . ATRIAL FIBRILLATION  . BRADYCARDIA-TACHYCARDIA SYNDROME    History  Smoking status  . Former Smoker -- 1.0 packs/day for 25 years  . Types: Cigarettes  . Quit date: 08/02/1961  Smokeless tobacco  . Never Used     History  Alcohol Use No    Family History  Problem Relation Age of Onset  . Heart attack Father     Review of Systems:   The patient denies any heat or cold intolerance.  No weight gain or weight loss.  The patient denies headaches or blurry vision.  There is no cough or sputum production.  The patient denies dizziness.  There is no hematuria or hematochezia.  The patient denies any muscle aches or arthritis.  The patient denies any rash.  The patient denies frequent falling or instability.  There is no history of depression or anxiety.  All other systems were reviewed and are negative.   Physical Exam:   He is a pleasant elderly gentleman in no acute distress. His weight is 160. Blood pressure is 118/80, heart rate 60.The head is normocephalic and atraumatic.  Pupils are equally round and reactive to light.  Sclerae nonicteric.  Conjunctiva is clear.  Oropharynx is unremarkable.  There's adequate oral airway.  Neck is supple there are no masses.  Thyroid is not enlarged.  There is no lymphadenopathy.  Lungs are clear.  Chest is symmetric.  Heart shows air regular rate and rhythm.  S1 and S2 are normal.  There is no murmur click or gallop.  Abdomen is soft normal bowel sounds.  There is no organomegaly.  Genital and rectal deferred.  Extremities are without edema.  Brace is on the left lower extremity  Peripheral pulses are adequate.  Neurologically intact.  Full range of motion.  The patient is not depressed.  Skin is warm and dry.  Assessment / Plan:

## 2011-03-04 ENCOUNTER — Encounter: Payer: Self-pay | Admitting: *Deleted

## 2011-03-09 ENCOUNTER — Encounter: Payer: Self-pay | Admitting: *Deleted

## 2011-03-12 ENCOUNTER — Ambulatory Visit (INDEPENDENT_AMBULATORY_CARE_PROVIDER_SITE_OTHER): Payer: Medicare Other | Admitting: *Deleted

## 2011-03-12 ENCOUNTER — Encounter: Payer: Self-pay | Admitting: Internal Medicine

## 2011-03-12 ENCOUNTER — Other Ambulatory Visit: Payer: Self-pay

## 2011-03-12 DIAGNOSIS — I495 Sick sinus syndrome: Secondary | ICD-10-CM

## 2011-03-12 LAB — REMOTE PACEMAKER DEVICE
BATTERY VOLTAGE: 2.74 V
BMOD-0005RV: 95 {beats}/min
BRDY-0002RV: 60 {beats}/min
BRDY-0004RV: 110 {beats}/min

## 2011-03-15 NOTE — Progress Notes (Signed)
PPM remote 

## 2011-03-23 ENCOUNTER — Encounter: Payer: Self-pay | Admitting: *Deleted

## 2011-06-10 ENCOUNTER — Encounter: Payer: Medicare Other | Admitting: *Deleted

## 2011-06-10 ENCOUNTER — Other Ambulatory Visit: Payer: Self-pay

## 2011-06-13 LAB — REMOTE PACEMAKER DEVICE
AL AMPLITUDE: 1.4 mv
AL THRESHOLD: 0.625 V
BMOD-0001RV: LOW
BMOD-0005RV: 95 {beats}/min

## 2011-06-28 ENCOUNTER — Encounter: Payer: Self-pay | Admitting: Internal Medicine

## 2011-06-28 ENCOUNTER — Ambulatory Visit (INDEPENDENT_AMBULATORY_CARE_PROVIDER_SITE_OTHER): Payer: Medicare Other | Admitting: Internal Medicine

## 2011-06-28 VITALS — BP 106/59 | HR 66 | Resp 18 | Ht 70.0 in | Wt 161.0 lb

## 2011-06-28 DIAGNOSIS — I495 Sick sinus syndrome: Secondary | ICD-10-CM

## 2011-06-28 DIAGNOSIS — I4891 Unspecified atrial fibrillation: Secondary | ICD-10-CM

## 2011-06-28 DIAGNOSIS — Z950 Presence of cardiac pacemaker: Secondary | ICD-10-CM

## 2011-06-28 LAB — PACEMAKER DEVICE OBSERVATION
BMOD-0001RV: LOW
BRDY-0002RV: 60 {beats}/min
BRDY-0004RV: 110 {beats}/min
RV LEAD AMPLITUDE: 11.2 mv
VENTRICULAR PACING PM: 80

## 2011-06-28 NOTE — Assessment & Plan Note (Signed)
Normal pacemaker function See Pace Art report No changes today  

## 2011-06-28 NOTE — Patient Instructions (Signed)
Your physician wants you to follow-up in: 6 months with Lori Gerhardt,NP and 12 months with Dr Allred You will receive a reminder letter in the mail two months in advance. If you don't receive a letter, please call our office to schedule the follow-up appointment.   Remote monitoring is used to monitor your Pacemaker of ICD from home. This monitoring reduces the number of office visits required to check your device to one time per year. It allows us to keep an eye on the functioning of your device to ensure it is working properly. You are scheduled for a device check from home on 09/30/11. You may send your transmission at any time that day. If you have a wireless device, the transmission will be sent automatically. After your physician reviews your transmission, you will receive a postcard with your next transmission date.   

## 2011-06-28 NOTE — Progress Notes (Signed)
The patient presents today for routine electrophysiology followup.  Since last being seen in our clinic, the patient reports doing reasonably well.  His primary concern remains with unsteadiness and falls.  He has significant gait instability and walks with two canes.  Today, he denies symptoms of palpitations, chest pain, shortness of breath, orthopnea, PND, lower extremity edema, dizziness, presyncope, syncope, or neurologic sequela.  The patient feels that he is tolerating medications without difficulties and is otherwise without complaint today.   Past Medical History  Diagnosis Date  . Hyperlipidemia   . Atrial fibrillation     permanent afib  . Long-term (current) use of anticoagulants     previously on coumadin, stopped by Dr Deborah Chalk due to frequent falls  . BRADYCARDIA-TACHYCARDIA SYNDROME 2007    s/p PPM by Dr Deborah Chalk   Past Surgical History  Procedure Date  . Abdominal surgery 1986  . Hernia repair   . Pacemaker insertion 01/28/06    PPM  (MDT) implanted by Dr Deborah Chalk 2007    Current Outpatient Prescriptions  Medication Sig Dispense Refill  . acetaminophen (TYLENOL) 500 MG tablet Take 500 mg by mouth as needed.        Marland Kitchen aspirin 81 MG tablet Take 81 mg by mouth daily.        . digoxin (LANOXIN) 0.25 MG tablet Take 250 mcg by mouth daily.        Marland Kitchen docusate sodium (COLACE) 50 MG capsule Take by mouth as needed.        . loratadine (CLARITIN) 10 MG tablet Take 10 mg by mouth daily.        . metoprolol succinate (TOPROL-XL) 25 MG 24 hr tablet Take 25 mg by mouth daily.        . multivitamin (THERAGRAN) per tablet Take 1 tablet by mouth daily.        . Omega-3 Fatty Acids (FISH OIL) 1000 MG CAPS Take 3 capsules by mouth daily.        . simvastatin (ZOCOR) 40 MG tablet Take 40 mg by mouth at bedtime.        . Tamsulosin HCl (FLOMAX) 0.4 MG CAPS Take 0.4 mg by mouth daily.       . polycarbophil (FIBERCON) 625 MG tablet Take 625 mg by mouth daily.          No Known  Allergies  History   Social History  . Marital Status: Single    Spouse Name: N/A    Number of Children: N/A  . Years of Education: N/A   Occupational History  . Not on file.   Social History Main Topics  . Smoking status: Former Smoker -- 1.0 packs/day for 25 years    Types: Cigarettes    Quit date: 08/02/1961  . Smokeless tobacco: Never Used  . Alcohol Use: No  . Drug Use: No  . Sexually Active: Not on file   Other Topics Concern  . Not on file   Social History Narrative  . No narrative on file    Family History  Problem Relation Age of Onset  . Heart attack Father    Physical Exam: Filed Vitals:   06/28/11 1056  BP: 106/59  Pulse: 66  Resp: 18  Height: 5\' 10"  (1.778 m)  Weight: 161 lb (73.029 kg)    GEN- The patient is elderly appearing, alert and oriented x 3 today.   Head- normocephalic, atraumatic Eyes-  Sclera clear, conjunctiva pink Ears- hearing intact Oropharynx- clear Neck- supple, no  JVP Lymph- no cervical lymphadenopathy Lungs- Clear to ausculation bilaterally, normal work of breathing Chest- R sided pacemaker pocket is well healed Heart- irregular rate and rhythm,   GI- soft, NT, ND, + BS Extremities- no clubbing, cyanosis, or edema MS- leg braces in place atrophy, unsteady when walking with two canes  ekg today reveal afib with demant V pacing, V rates 70 bpm  Pacemaker interrogation- reviewed in detail today,  See PACEART report  Assessment and Plan:

## 2011-06-28 NOTE — Assessment & Plan Note (Signed)
Permanent afib He continues to fall 2-3 times per month.  I agree with Dr Deborah Chalk that at this time, risks of traumatic bleed on coumadin probably outweight the risk of stroke.  I had a long discussion with the patient regarding pros and cons of anticoagulation.  At this time, he is clear that he wishes to avoid coumadin.

## 2011-09-02 ENCOUNTER — Encounter: Payer: Medicare Other | Admitting: Internal Medicine

## 2011-09-30 ENCOUNTER — Ambulatory Visit (INDEPENDENT_AMBULATORY_CARE_PROVIDER_SITE_OTHER): Payer: Medicare Other | Admitting: *Deleted

## 2011-09-30 ENCOUNTER — Encounter: Payer: Self-pay | Admitting: Internal Medicine

## 2011-09-30 DIAGNOSIS — Z950 Presence of cardiac pacemaker: Secondary | ICD-10-CM

## 2011-09-30 DIAGNOSIS — I495 Sick sinus syndrome: Secondary | ICD-10-CM

## 2011-10-08 LAB — REMOTE PACEMAKER DEVICE
BATTERY VOLTAGE: 2.74 V
BMOD-0001RV: LOW
BMOD-0003RV: 30
BMOD-0005RV: 95 {beats}/min

## 2011-10-11 ENCOUNTER — Encounter: Payer: Self-pay | Admitting: *Deleted

## 2011-10-13 NOTE — Progress Notes (Signed)
Pacer remote 

## 2011-12-30 ENCOUNTER — Ambulatory Visit (INDEPENDENT_AMBULATORY_CARE_PROVIDER_SITE_OTHER): Payer: Medicare Other | Admitting: *Deleted

## 2011-12-30 ENCOUNTER — Encounter: Payer: Self-pay | Admitting: Internal Medicine

## 2011-12-30 DIAGNOSIS — I495 Sick sinus syndrome: Secondary | ICD-10-CM

## 2011-12-30 DIAGNOSIS — Z950 Presence of cardiac pacemaker: Secondary | ICD-10-CM

## 2012-01-03 LAB — REMOTE PACEMAKER DEVICE
BATTERY VOLTAGE: 2.73 V
BMOD-0001RV: LOW
RV LEAD AMPLITUDE: 8 mv
RV LEAD IMPEDENCE PM: 509 Ohm
RV LEAD THRESHOLD: 0.625 V
VENTRICULAR PACING PM: 77

## 2012-01-14 ENCOUNTER — Encounter: Payer: Self-pay | Admitting: *Deleted

## 2012-04-06 ENCOUNTER — Ambulatory Visit (INDEPENDENT_AMBULATORY_CARE_PROVIDER_SITE_OTHER): Payer: Medicare Other | Admitting: *Deleted

## 2012-04-06 ENCOUNTER — Encounter: Payer: Self-pay | Admitting: Internal Medicine

## 2012-04-06 DIAGNOSIS — I495 Sick sinus syndrome: Secondary | ICD-10-CM

## 2012-04-11 LAB — REMOTE PACEMAKER DEVICE
BATTERY VOLTAGE: 2.72 V
BMOD-0001RV: LOW
BMOD-0003RV: 30
BRDY-0002RV: 60 {beats}/min
RV LEAD AMPLITUDE: 11.2 mv
RV LEAD IMPEDENCE PM: 498 Ohm
RV LEAD THRESHOLD: 0.625 V
VENTRICULAR PACING PM: 77

## 2012-05-05 ENCOUNTER — Encounter: Payer: Self-pay | Admitting: *Deleted

## 2012-06-30 ENCOUNTER — Encounter: Payer: Self-pay | Admitting: *Deleted

## 2012-07-07 ENCOUNTER — Encounter: Payer: Self-pay | Admitting: Internal Medicine

## 2012-07-07 ENCOUNTER — Ambulatory Visit (INDEPENDENT_AMBULATORY_CARE_PROVIDER_SITE_OTHER): Payer: Medicare Other | Admitting: Internal Medicine

## 2012-07-07 ENCOUNTER — Other Ambulatory Visit: Payer: Self-pay | Admitting: *Deleted

## 2012-07-07 VITALS — BP 120/60 | HR 71 | Ht 70.0 in | Wt 158.0 lb

## 2012-07-07 DIAGNOSIS — Z950 Presence of cardiac pacemaker: Secondary | ICD-10-CM

## 2012-07-07 DIAGNOSIS — I495 Sick sinus syndrome: Secondary | ICD-10-CM

## 2012-07-07 DIAGNOSIS — I4891 Unspecified atrial fibrillation: Secondary | ICD-10-CM

## 2012-07-07 LAB — BASIC METABOLIC PANEL
BUN: 19 mg/dL (ref 6–23)
Chloride: 103 mEq/L (ref 96–112)
Sodium: 139 mEq/L (ref 135–145)

## 2012-07-07 LAB — PACEMAKER DEVICE OBSERVATION
BATTERY VOLTAGE: 2.71 V
BMOD-0003RV: 30
BRDY-0004RV: 110 {beats}/min
RV LEAD AMPLITUDE: 11.2 mv

## 2012-07-07 MED ORDER — METOPROLOL SUCCINATE ER 50 MG PO TB24: 50.0000 mg | ORAL_TABLET | Freq: Every day | ORAL | Status: DC

## 2012-07-07 NOTE — Patient Instructions (Addendum)
Remote monitoring is used to monitor your Pacemaker of ICD from home. This monitoring reduces the number of office visits required to check your device to one time per year. It allows [place] to keep an eye on the functioning of your device to ensure it is working properly. You are scheduled for a device check from home on October 09, 2012. You may send your transmission at any time that day. If you have a wireless device, the transmission will be sent automatically. After your physician reviews your transmission, you will receive a postcard with your next transmission date.  Your physician wants you to follow-up in: 6 months with Sunday Spillers and 1 year with Dr Johney Frame. You will receive a reminder letter in the mail two months in advance. If you don't receive a letter, please call our office to schedule the follow-up appointment.   Increase your Metoprolol to 50mg  daily

## 2012-07-09 LAB — DIGOXIN LEVEL: Digoxin Level: 1.6 ng/mL (ref 0.8–2.0)

## 2012-07-09 NOTE — Progress Notes (Signed)
PCP: Antonio Laroche, MD  [name] is a 78 y.o. male who presents today for routine electrophysiology followup.  Since last being seen in our clinic, the patient reports doing very well.  He remains active for his age. Today, he denies symptoms of palpitations, chest pain, shortness of breath,  lower extremity edema, dizziness, presyncope, or syncope.  The patient is otherwise without complaint today.   Past Medical History  Diagnosis Date  . Hyperlipidemia   . Atrial fibrillation     permanent afib  . Long-term (current) use of anticoagulants     previously on coumadin, stopped by Dr [name] due to frequent falls  . BRADYCARDIA-TACHYCARDIA SYNDROME 2007    s/p PPM by Dr [name]   Past Surgical History  Procedure Date  . Abdominal surgery 1986  . Hernia repair   . Pacemaker insertion 01/28/06    PPM  (MDT) implanted by Dr [name] 2007    Current Outpatient Prescriptions  Medication Sig Dispense Refill  . aspirin 81 MG tablet Take 81 mg by mouth daily.        . digoxin (LANOXIN) 0.25 MG tablet Take 250 mcg by mouth daily.        . metoprolol succinate (TOPROL-XL) 50 MG 24 hr tablet Take 1 tablet (50 mg total) by mouth daily.  90 tablet  3  . simvastatin (ZOCOR) 40 MG tablet Take 40 mg by mouth at bedtime.        . Tamsulosin HCl (FLOMAX) 0.4 MG CAPS Take 0.4 mg by mouth daily.         Physical Exam: Filed Vitals:   07/07/12 1101  BP: 120/60  Pulse: 71  Height: 5\' 10"  (1.778 m)  Weight: 158 lb (71.668 kg)  SpO2: 97%    GEN- The patient is well appearing, alert and oriented x 3 today.   Head- normocephalic, atraumatic Eyes-  Sclera clear, conjunctiva pink Ears- hearing intact Oropharynx- clear Lungs- Clear to ausculation bilaterally, normal work of breathing Chest- pacemaker pocket is well healed Heart- irregular rate and rhythm,   GI- soft, NT, ND, + BS Extremities- no clubbing, cyanosis, or edema  Pacemaker interrogation- reviewed in detail today,  See [facility]  report  Assessment and Plan:  ATRIAL FIBRILLATION  Permanent afib  Not previously felt to be a candidate for anticoagulation due to frequent falls by Dr [name].  Unfortunately, he continues to fall often.  He will therefore continue ASA Check digoxin level and BMET today V rates are frequently elevated by pacemaker interrogation today.  I will increase toprol to 50mg  daily   BRADYCARDIA-TACHYCARDIA SYNDROME  Normal pacemaker function  See [name] Art report  As above  Return to see [name] in 6 months.  I will see again in 1 year

## 2012-07-19 ENCOUNTER — Encounter: Payer: Self-pay | Admitting: Internal Medicine

## 2012-08-28 ENCOUNTER — Emergency Department (HOSPITAL_COMMUNITY): Payer: Medicare Other

## 2012-08-28 ENCOUNTER — Encounter (HOSPITAL_COMMUNITY): Payer: Self-pay | Admitting: *Deleted

## 2012-08-28 ENCOUNTER — Other Ambulatory Visit: Payer: Self-pay | Admitting: Family Medicine

## 2012-08-28 ENCOUNTER — Inpatient Hospital Stay (HOSPITAL_COMMUNITY)
Admission: EM | Admit: 2012-08-28 | Discharge: 2012-09-02 | DRG: 389 | Disposition: A | Discharge status: Home / Self Care | Payer: Medicare Other | Attending: Internal Medicine | Admitting: Internal Medicine

## 2012-08-28 ENCOUNTER — Ambulatory Visit
Admission: RE | Admit: 2012-08-28 | Discharge: 2012-08-28 | Disposition: A | Discharge status: Home / Self Care | Payer: Medicare Other | Source: Ambulatory Visit | Attending: Family Medicine | Admitting: Family Medicine

## 2012-08-28 DIAGNOSIS — N179 Acute kidney failure, unspecified: Secondary | ICD-10-CM

## 2012-08-28 DIAGNOSIS — Z950 Presence of cardiac pacemaker: Secondary | ICD-10-CM

## 2012-08-28 DIAGNOSIS — I495 Sick sinus syndrome: Secondary | ICD-10-CM | POA: Diagnosis present

## 2012-08-28 DIAGNOSIS — R1110 Vomiting, unspecified: Secondary | ICD-10-CM

## 2012-08-28 DIAGNOSIS — E785 Hyperlipidemia, unspecified: Secondary | ICD-10-CM | POA: Diagnosis present

## 2012-08-28 DIAGNOSIS — Z79899 Other long term (current) drug therapy: Secondary | ICD-10-CM

## 2012-08-28 DIAGNOSIS — I4891 Unspecified atrial fibrillation: Secondary | ICD-10-CM

## 2012-08-28 DIAGNOSIS — K5900 Constipation, unspecified: Secondary | ICD-10-CM | POA: Diagnosis present

## 2012-08-28 DIAGNOSIS — R109 Unspecified abdominal pain: Secondary | ICD-10-CM

## 2012-08-28 DIAGNOSIS — Z7901 Long term (current) use of anticoagulants: Secondary | ICD-10-CM

## 2012-08-28 DIAGNOSIS — R52 Pain, unspecified: Secondary | ICD-10-CM

## 2012-08-28 DIAGNOSIS — E860 Dehydration: Secondary | ICD-10-CM

## 2012-08-28 DIAGNOSIS — K56609 Unspecified intestinal obstruction, unspecified as to partial versus complete obstruction: Secondary | ICD-10-CM

## 2012-08-28 DIAGNOSIS — K56600 Partial intestinal obstruction, unspecified as to cause: Secondary | ICD-10-CM

## 2012-08-28 LAB — CBC
HCT: 42.1 % (ref 39.0–52.0)
MCH: 32.8 pg (ref 26.0–34.0)
MCV: 100 fL (ref 78.0–100.0)
Platelets: 140 10*3/uL — ABNORMAL LOW (ref 150–400)
RDW: 12.6 % (ref 11.5–15.5)
WBC: 7.9 10*3/uL (ref 4.0–10.5)

## 2012-08-28 LAB — COMPREHENSIVE METABOLIC PANEL
AST: 24 U/L (ref 0–37)
BUN: 39 mg/dL — ABNORMAL HIGH (ref 6–23)
CO2: 28 mEq/L (ref 19–32)
Calcium: 9.6 mg/dL (ref 8.4–10.5)
Chloride: 98 mEq/L (ref 96–112)
Creatinine, Ser: 1.58 mg/dL — ABNORMAL HIGH (ref 0.50–1.35)
GFR calc non Af Amer: 37 mL/min — ABNORMAL LOW (ref >90)
Total Bilirubin: 0.8 mg/dL (ref 0.3–1.2)

## 2012-08-28 MED ORDER — SODIUM CHLORIDE 0.9 % IV BOLUS (SEPSIS)
500.0000 mL | Freq: Once | INTRAVENOUS | Status: AC
  Administered 2012-08-28: 500 mL via INTRAVENOUS

## 2012-08-28 MED ORDER — SODIUM CHLORIDE 0.9 % IV SOLN: INTRAVENOUS | Status: DC

## 2012-08-28 MED ORDER — IOHEXOL 300 MG/ML  SOLN
25.0000 mL | Freq: Once | INTRAMUSCULAR | Status: AC | PRN
  Administered 2012-08-28: 25 mL via ORAL

## 2012-08-28 MED ORDER — ONDANSETRON HCL 4 MG/2ML IJ SOLN: 4.0000 mg | Freq: Three times a day (TID) | INTRAMUSCULAR | Status: DC | PRN

## 2012-08-28 MED ORDER — ONDANSETRON HCL 4 MG/2ML IJ SOLN
4.0000 mg | Freq: Once | INTRAMUSCULAR | Status: AC
  Administered 2012-08-28: 4 mg via INTRAVENOUS
  Filled 2012-08-28: qty 2

## 2012-08-28 NOTE — H&P (Signed)
PCP:   [name], MD   Chief Complaint:  n/v  HPI: 79 yo male with one day of n/v and periumbilical abd pain.  No diarrhea.  bm today.  No fevers.  nonbloody vomit.  bm was normal also.  Has had some relief with nausea and pain meds in ED with ivf.  Has psbo.  Review of Systems:  O/w neg except per HPI  Past Medical History: Past Medical History  Diagnosis Date  . Hyperlipidemia   . Atrial fibrillation     permanent afib  . Long-term (current) use of anticoagulants     previously on coumadin, stopped by Dr Deborah Chalk due to frequent falls  . BRADYCARDIA-TACHYCARDIA SYNDROME 2007    s/p PPM by Dr Deborah Chalk   Past Surgical History  Procedure Date  . Abdominal surgery 1986  . Hernia repair   . Pacemaker insertion 01/28/06    PPM  (MDT) implanted by Dr Deborah Chalk 2007    Medications: Prior to Admission medications   Medication Sig Start Date End Date Taking? Authorizing Provider  aspirin 325 MG tablet Take 325 mg by mouth daily.   Yes Historical Provider, MD  digoxin (LANOXIN) 0.25 MG tablet Take 250 mcg by mouth daily.     Yes Historical Provider, MD  metoprolol succinate (TOPROL-XL) 50 MG 24 hr tablet Take 1 tablet (50 mg total) by mouth daily. 07/07/12  Yes Hillis Range, MD  simvastatin (ZOCOR) 40 MG tablet Take 40 mg by mouth at bedtime.     Yes Historical Provider, MD  Tamsulosin HCl (FLOMAX) 0.4 MG CAPS Take 0.4 mg by mouth daily.  06/07/11  Yes Historical Provider, MD  acetaminophen (TYLENOL) 325 MG tablet Take 325 mg by mouth every 6 (six) hours as needed.    Historical Provider, MD    Allergies:  No Known Allergies  Social History:  reports that he quit smoking about 51 years ago. His smoking use included Cigarettes. He has a 25 pack-year smoking history. He has never used smokeless tobacco. He reports that he does not drink alcohol or use illicit drugs.  Family History: Family History  Problem Relation Age of Onset  . Heart attack Father     Physical  Exam: Filed Vitals:   08/28/12 1919  BP: 141/82  Pulse: 87  Temp: 98.9 F (37.2 C)  TempSrc: Oral  Resp: 20  Height: 5\' 9"  (1.753 m)  Weight: 68.04 kg (150 lb)  SpO2: 96%   General appearance: alert, cooperative and no distress Neck: no JVD and supple, symmetrical, trachea midline Lungs: clear to auscultation bilaterally Heart: regular rate and rhythm, S1, S2 normal, no murmur, click, rub or gallop Abdomen: soft, non-tender; bowel sounds normal; no masses,  no organomegaly Extremities: extremities normal, atraumatic, no cyanosis or edema Pulses: 2+ and symmetric Skin: Skin color, texture, turgor normal. No rashes or lesions Neurologic: Grossly normal    Labs on Admission:   [facility] 08/28/12 2100  NA 139  K 4.2  CL 98  CO2 28  GLUCOSE 142*  BUN 39*  CREATININE 1.58*  CALCIUM 9.6  MG --  PHOS --    Basename 08/28/12 2100  AST 24  ALT 13  ALKPHOS 53  BILITOT 0.8  PROT 7.8  ALBUMIN 4.3    Basename 08/28/12 2100  WBC 7.9  NEUTROABS --  HGB 13.8  HCT 42.1  MCV 100.0  PLT 140*   Radiological Exams on Admission: Ct Abdomen Pelvis Wo Contrast  08/28/2012  *RADIOLOGY REPORT*  Clinical Data:  Vomiting, abdominal pain.  CT ABDOMEN AND PELVIS WITHOUT CONTRAST  Technique:  Multidetector CT imaging of the abdomen and pelvis was performed following the standard protocol without intravenous contrast.  Comparison: 08/28/2012 radiograph  Findings: Partially imaged pacer wire terminates within the right ventricle.  Heart size mildly enlarged.  Mild peripheral reticular opacities.  No confluent airspace opacity.  No pleural effusion or pneumothorax.  Organ abnormality/lesion detection is limited in the absence of intravenous contrast. Within this limitation, subcentimeter hypodensity within segment 5/6 as seen on image 27 is nonspecific. Unremarkable spleen, pancreas, adrenal glands. Gallstones.  No biliary ductal dilatation.  Symmetric renal size.  Incompletely characterize  hypodensity arising posteriorly from the right kidney, favored to represent a cyst.  No hydronephrosis or hydroureter.  Colonic diverticulosis.  No CT evidence for colitis or diverticulitis.  Normal appendix.  There are dilated loops of small bowel with air-fluid levels, measuring up to 4.5 cm.  The distal small bowel loops are decompressed, with transition point in the right lower quadrant, statistically secondary to underlying adhesions.  Small bowel loops deviate toward the right inguinal canal however without extension into the canal.  There is suggestion of prior right inguinal hernia repair.  Thin-walled bladder.  Scattered atherosclerotic disease of the aorta and branch vessels.  Multilevel degenerative changes of the imaged spine. Postoperative changes of the lower lumbar spine.  No acute or aggressive appearing osseous lesion.  IMPRESSION: Small bowel obstruction with transition point in the right lower quadrant, favored to be secondary to underlying adhesion.   Original Report Authenticated By: Jearld Lesch, M.D.    Dg Abd 2 Views  08/28/2012  *RADIOLOGY REPORT*  Clinical Data: Vomiting, abdominal pain, history of bowel obstruction  ABDOMEN - 2 VIEW  Comparison: None.  Findings: Dilated loops of small bowel in the left upper abdomen.  While the colon is not decompressed, the appearance remains worrisome for early/partial small bowel obstruction.  No evidence of free air under the diaphragm on the upright view.  Degenerative changes of the visualized thoracolumbar spine.  Cardiomegaly.  Pacemaker lead, incompletely visualized.  IMPRESSION: Dilated loops of small bowel in the left upper abdomen, worrisome for early/partial small bowel obstruction.   Original Report Authenticated By: Charline Bills, M.D.     Assessment/Plan  79 yo male with psbo Principal Problem:  *Partial small bowel obstruction Active Problems:  Atrial fibrillation  Pacemaker-[employer]  AKI (acute kidney injury)   Abdominal pain, acute  Nausea and vomiting  Dehydration  Npo.  Ivf.  Place ngt if cont to vomit.  gen surgery has been called for consult and will be seeing.  abd exam benign.  Afvss.  Med bed.  Hopefully will improve with conservative tx.  Makana Rostad A 08/28/2012, 11:05 PM

## 2012-08-28 NOTE — ED Notes (Signed)
Abdominal discomfort progressing on yesterday and today. Unable keep anything down, vomiting episodes today, too numerous to count. Admits to having a bowel movement, not diarrhea.

## 2012-08-28 NOTE — ED Notes (Signed)
Pt c/o abd pain and vomiting x 1 day- pt saw Dr Tanya Nones today and had abd films- results show possible early bowel obstruction

## 2012-08-28 NOTE — ED Provider Notes (Signed)
History     CSN: [id number]  Arrival date & time 08/28/12  1814   First MD Initiated Contact with Patient 08/28/12 2042      Chief Complaint  Patient presents with  . Abdominal Pain    (Consider location/radiation/quality/duration/timing/severity/associated sxs/prior treatment) Patient is a 79 y.o. male presenting with abdominal pain. The history is provided by the patient (the pt complains of vomiting and mild abd pain).  Abdominal Pain The primary symptoms of the illness include abdominal pain. The primary symptoms of the illness do not include fatigue or diarrhea. The current episode started 2 days ago. The onset of the illness was sudden. The problem has not changed since onset. Associated with: nothing. The patient states that she believes she is currently not pregnant. Symptoms associated with the illness do not include chills, hematuria, frequency or back pain. Significant associated medical issues include PUD.    Past Medical History  Diagnosis Date  . Hyperlipidemia   . Atrial fibrillation     permanent afib  . Long-term (current) use of anticoagulants     previously on coumadin, stopped by Dr Deborah Chalk due to frequent falls  . BRADYCARDIA-TACHYCARDIA SYNDROME 2007    s/p PPM by Dr Deborah Chalk    Past Surgical History  Procedure Date  . Abdominal surgery 1986  . Hernia repair   . Pacemaker insertion 01/28/06    PPM  (MDT) implanted by Dr Deborah Chalk 2007    Family History  Problem Relation Age of Onset  . Heart attack Father     History  Substance Use Topics  . Smoking status: Former Smoker -- 1.0 packs/day for 25 years    Types: Cigarettes    Quit date: 08/02/1961  . Smokeless tobacco: Never Used  . Alcohol Use: No      Review of Systems  Constitutional: Negative for chills and fatigue.  HENT: Negative for congestion, sinus pressure and ear discharge.   Eyes: Negative for discharge.  Respiratory: Negative for cough.   Cardiovascular: Negative for chest  pain.  Gastrointestinal: Positive for abdominal pain. Negative for diarrhea.  Genitourinary: Negative for frequency and hematuria.  Musculoskeletal: Negative for back pain.  Skin: Negative for rash.  Neurological: Negative for seizures and headaches.  Hematological: Negative.   Psychiatric/Behavioral: Negative for hallucinations.    Allergies  Review of patient's allergies indicates no known allergies.  Home Medications   Current Outpatient Rx  Name  Route  Sig  Dispense  Refill  . ASPIRIN 325 MG PO TABS   Oral   Take 325 mg by mouth daily.         Marland Kitchen DIGOXIN 0.25 MG PO TABS   Oral   Take 250 mcg by mouth daily.           Marland Kitchen METOPROLOL SUCCINATE ER 50 MG PO TB24   Oral   Take 1 tablet (50 mg total) by mouth daily.   90 tablet   3   . SIMVASTATIN 40 MG PO TABS   Oral   Take 40 mg by mouth at bedtime.           . TAMSULOSIN HCL 0.4 MG PO CAPS   Oral   Take 0.4 mg by mouth daily.          . ACETAMINOPHEN 325 MG PO TABS   Oral   Take 325 mg by mouth every 6 (six) hours as needed.           BP 141/82  Pulse 87  Temp  98.9 F (37.2 C) (Oral)  Resp 20  Ht 5\' 9"  (1.753 m)  Wt 150 lb (68.04 kg)  BMI 22.15 kg/m2  SpO2 96%  Physical Exam  Constitutional: He is oriented to person, place, and time. He appears well-developed.  HENT:  Head: Normocephalic and atraumatic.  Eyes: Conjunctivae normal and EOM are normal. No scleral icterus.  Neck: Neck supple. No thyromegaly present.  Cardiovascular: Normal rate and regular rhythm.  Exam reveals no gallop and no friction rub.   No murmur heard. Pulmonary/Chest: No stridor. He has no wheezes. He has no rales. He exhibits no tenderness.  Abdominal: He exhibits distension. There is tenderness. There is no rebound.       Minimal tenderness and distention  Musculoskeletal: Normal range of motion. He exhibits no edema.  Lymphadenopathy:    He has no cervical adenopathy.  Neurological: He is oriented to person, place,  and time. Coordination normal.  Skin: No rash noted. No erythema.  Psychiatric: He has a normal mood and affect. His behavior is normal.    ED Course  Procedures (including critical care time)  Labs Reviewed  CBC - Abnormal; Notable for the following:    RBC 4.21 (*)     Platelets 140 (*)     All other components within normal limits  COMPREHENSIVE METABOLIC PANEL - Abnormal; Notable for the following:    Glucose, Bld 142 (*)     BUN 39 (*)     Creatinine, Ser 1.58 (*)     GFR calc non Af Amer 37 (*)     GFR calc Af Amer 43 (*)     All other components within normal limits  DIGOXIN LEVEL   Ct Abdomen Pelvis Wo Contrast  08/28/2012  *RADIOLOGY REPORT*  Clinical Data: Vomiting, abdominal pain.  CT ABDOMEN AND PELVIS WITHOUT CONTRAST  Technique:  Multidetector CT imaging of the abdomen and pelvis was performed following the standard protocol without intravenous contrast.  Comparison: 08/28/2012 radiograph  Findings: Partially imaged pacer wire terminates within the right ventricle.  Heart size mildly enlarged.  Mild peripheral reticular opacities.  No confluent airspace opacity.  No pleural effusion or pneumothorax.  Organ abnormality/lesion detection is limited in the absence of intravenous contrast. Within this limitation, subcentimeter hypodensity within segment 5/6 as seen on image 27 is nonspecific. Unremarkable spleen, pancreas, adrenal glands. Gallstones.  No biliary ductal dilatation.  Symmetric renal size.  Incompletely characterize hypodensity arising posteriorly from the right kidney, favored to represent a cyst.  No hydronephrosis or hydroureter.  Colonic diverticulosis.  No CT evidence for colitis or diverticulitis.  Normal appendix.  There are dilated loops of small bowel with air-fluid levels, measuring up to 4.5 cm.  The distal small bowel loops are decompressed, with transition point in the right lower quadrant, statistically secondary to underlying adhesions.  Small bowel loops  deviate toward the right inguinal canal however without extension into the canal.  There is suggestion of prior right inguinal hernia repair.  Thin-walled bladder.  Scattered atherosclerotic disease of the aorta and branch vessels.  Multilevel degenerative changes of the imaged spine. Postoperative changes of the lower lumbar spine.  No acute or aggressive appearing osseous lesion.  IMPRESSION: Small bowel obstruction with transition point in the right lower quadrant, favored to be secondary to underlying adhesion.   Original Report Authenticated By: Jearld Lesch, M.D.    Dg Abd 2 Views  08/28/2012  *RADIOLOGY REPORT*  Clinical Data: Vomiting, abdominal pain, history of bowel obstruction  ABDOMEN - 2 VIEW  Comparison: None.  Findings: Dilated loops of small bowel in the left upper abdomen.  While the colon is not decompressed, the appearance remains worrisome for early/partial small bowel obstruction.  No evidence of free air under the diaphragm on the upright view.  Degenerative changes of the visualized thoracolumbar spine.  Cardiomegaly.  Pacemaker lead, incompletely visualized.  IMPRESSION: Dilated loops of small bowel in the left upper abdomen, worrisome for early/partial small bowel obstruction.   Original Report Authenticated By: Charline Bills, M.D.      1. SBO (small bowel obstruction)      General surgery will follow with [facility]         Benny Lennert, MD 08/28/12 2307

## 2012-08-29 ENCOUNTER — Inpatient Hospital Stay (HOSPITAL_COMMUNITY): Payer: Medicare Other

## 2012-08-29 ENCOUNTER — Encounter (HOSPITAL_COMMUNITY): Payer: Self-pay

## 2012-08-29 DIAGNOSIS — K56609 Unspecified intestinal obstruction, unspecified as to partial versus complete obstruction: Secondary | ICD-10-CM

## 2012-08-29 LAB — BASIC METABOLIC PANEL
Calcium: 8.9 mg/dL (ref 8.4–10.5)
GFR calc Af Amer: 48 mL/min — ABNORMAL LOW (ref >90)
GFR calc non Af Amer: 41 mL/min — ABNORMAL LOW (ref >90)
Potassium: 4.2 mEq/L (ref 3.5–5.1)
Sodium: 138 mEq/L (ref 135–145)

## 2012-08-29 LAB — CBC
Hemoglobin: 12.3 g/dL — ABNORMAL LOW (ref 13.0–17.0)
Platelets: 134 10*3/uL — ABNORMAL LOW (ref 150–400)
RBC: 3.78 MIL/uL — ABNORMAL LOW (ref 4.22–5.81)
WBC: 6.6 10*3/uL (ref 4.0–10.5)

## 2012-08-29 LAB — DIGOXIN LEVEL: Digoxin Level: 1.1 ng/mL (ref 0.8–2.0)

## 2012-08-29 MED ORDER — HYDROMORPHONE HCL PF 1 MG/ML IJ SOLN: 1.0000 mg | INTRAMUSCULAR | Status: DC | PRN

## 2012-08-29 MED ORDER — SODIUM CHLORIDE 0.9 % IV SOLN
INTRAVENOUS | Status: AC
  Administered 2012-08-29: via INTRAVENOUS

## 2012-08-29 MED ORDER — ONDANSETRON HCL 4 MG/2ML IJ SOLN: 4.0000 mg | Freq: Four times a day (QID) | INTRAMUSCULAR | Status: DC | PRN

## 2012-08-29 MED ORDER — TAMSULOSIN HCL 0.4 MG PO CAPS
0.4000 mg | ORAL_CAPSULE | Freq: Every day | ORAL | Status: DC
  Administered 2012-08-29 – 2012-09-02 (×5): 0.4 mg via ORAL
  Filled 2012-08-29 (×5): qty 1

## 2012-08-29 MED ORDER — METOPROLOL SUCCINATE ER 50 MG PO TB24
50.0000 mg | ORAL_TABLET | Freq: Every day | ORAL | Status: DC
  Administered 2012-08-29 – 2012-09-02 (×5): 50 mg via ORAL
  Filled 2012-08-29 (×5): qty 1

## 2012-08-29 MED ORDER — KCL IN DEXTROSE-NACL 20-5-0.45 MEQ/L-%-% IV SOLN
INTRAVENOUS | Status: DC
  Administered 2012-08-29 – 2012-09-02 (×7): via INTRAVENOUS
  Filled 2012-08-29 (×10): qty 1000

## 2012-08-29 MED ORDER — ONDANSETRON HCL 4 MG PO TABS: 4.0000 mg | ORAL_TABLET | Freq: Four times a day (QID) | ORAL | Status: DC | PRN

## 2012-08-29 NOTE — Progress Notes (Signed)
Patient experiencing confusion has pulled out IV and stated the fluid is poisoning his blood. Refuses for a reconnect. Patient has bed alarm on but refuses to stay in bed. Has threatened to leave the hospital if a doctor does not see him in ten minutes. Contacted AC for a Recruitment consultant none are available at this time. Bed alarm on patient. Will continue to monitor patient and attempt to keep him safe. Lurena Joiner, RN

## 2012-08-29 NOTE — Consult Note (Signed)
Reason for Consult: SBO Referring Physician: Dr. Tarry Kos Primary care: [facility] [name] is an 79 y.o. male.  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 He reported a week of nausea and vomiting, along with some periumbilical pain; he reports his abdomen had, "grown hard inside."  This had been going on for approximately 2 weeks. He does have a problem with chronic constipation it appears he's not had a bowel movement for 3-4 days prior to seeing his primary care doctor yesterday. Since yesterday he's had a couple small bowel movements;  the last he said was almost normal. He had some nausea when he arrived but no further nausea or vomiting since admission as far as I can tell. Right now he feels fine.  He was seen in the ER yesterday evening. Two-view abdominal films shows dilated small bowel loops and left upper abdomen. Lab showed a mildly elevated BUN 39 and creatinine of 1.58. A CT without contrast was obtained this showed colonic diverticulosis no evidence for colitis or diverticulitis a normal appendix. There were dilated loops of small bowel with air-fluid levels measuring up to 4.5 cm distal loops were decompressed with transition point in the right lower quadrant. Small bowel loops deviate toward the right inguinal canal without extension into the canal.  He was subsequently admitted by the hospitalist around 11 PM last night. He's been on the floor, with a bowel movement reported since admission. He denies any further nausea and vomiting, and NG tube has not been placed, currently he does not have any nausea, abdominal distention or abdominal discomfort. We are asked to see in consultation. He reports prior colonoscopy, but he does not remember who or when he had one with.  Past Medical History  Diagnosis Date  . Hyperlipidemia   . Atrial fibrillation     permanent afib  . Long-term  (current) use of anticoagulants     previously on coumadin, stopped by Dr Deborah Chalk due to frequent falls  . BRADYCARDIA-TACHYCARDIA SYNDROME 2007    s/p PPM by Dr Deborah Chalk    Past Surgical History  Procedure Date  . Abdominal surgery Not sure what this was. 1986  . Hernia repair  He reports multiple hernia repairs, Dr. Elesa Hacker   And possible DR. Ballen   . Pacemaker insertion 01/28/06    PPM  (MDT) implanted by Dr Deborah Chalk 2007    Family History  Problem Relation Age of Onset  . Heart attack Father     Social History:  reports that he quit smoking about 51 years ago. His smoking use included Cigarettes. He has a 25 pack-year smoking history. He has never used smokeless tobacco. He reports that he does not drink alcohol or use illicit drugs.  Allergies: No Known Allergies  Medications:  Prior to Admission:  Prescriptions prior to admission  Medication Sig Dispense Refill  . aspirin 325 MG tablet Take 325 mg by mouth daily.      . digoxin (LANOXIN) 0.25 MG tablet Take 250 mcg by mouth daily.        . metoprolol succinate (TOPROL-XL) 50 MG 24 hr tablet Take 1 tablet (50 mg total) by mouth daily.  90 tablet  3  . simvastatin (ZOCOR) 40 MG tablet Take 40 mg by mouth at bedtime.        . Tamsulosin HCl (FLOMAX) 0.4 MG CAPS Take 0.4 mg by mouth daily.       Marland Kitchen  acetaminophen (TYLENOL) 325 MG tablet Take 325 mg by mouth every 6 (six) hours as needed.       Scheduled:   . metoprolol succinate  50 mg Oral Daily  . Tamsulosin HCl  0.4 mg Oral Daily   Continuous:   . sodium chloride 75 mL/hr at 08/29/12 0015   ZOX:[id number] (DILAUDID) injection, ondansetron (ZOFRAN) IV, ondansetron Anti-infectives    None      Results for orders placed during the hospital encounter of 08/28/12 (from the past 48 hour(s))  CBC     Status: Abnormal   Collection Time   08/28/12  9:00 PM      Component Value Range Comment   WBC 7.9  4.0 - 10.5 K/uL    RBC 4.21 (*) 4.22 - 5.81 MIL/uL    Hemoglobin  13.8  13.0 - 17.0 g/dL    HCT 14.7  82.9 - 56.2 %    MCV 100.0  78.0 - 100.0 fL    MCH 32.8  26.0 - 34.0 pg    MCHC 32.8  30.0 - 36.0 g/dL    RDW 13.0  86.5 - 78.4 %    Platelets 140 (*) 150 - 400 K/uL   COMPREHENSIVE METABOLIC PANEL     Status: Abnormal   Collection Time   08/28/12  9:00 PM      Component Value Range Comment   Sodium 139  135 - 145 mEq/L    Potassium 4.2  3.5 - 5.1 mEq/L    Chloride 98  96 - 112 mEq/L    CO2 28  19 - 32 mEq/L    Glucose, Bld 142 (*) 70 - 99 mg/dL    BUN 39 (*) 6 - 23 mg/dL    Creatinine, Ser 6.96 (*) 0.50 - 1.35 mg/dL    Calcium 9.6  8.4 - 29.5 mg/dL    Total Protein 7.8  6.0 - 8.3 g/dL    Albumin 4.3  3.5 - 5.2 g/dL    AST 24  0 - 37 U/L    ALT 13  0 - 53 U/L    Alkaline Phosphatase 53  39 - 117 U/L    Total Bilirubin 0.8  0.3 - 1.2 mg/dL    GFR calc non Af Amer 37 (*) >90 mL/min    GFR calc Af Amer 43 (*) >90 mL/min   DIGOXIN LEVEL     Status: Normal   Collection Time   08/29/12 12:32 AM      Component Value Range Comment   Digoxin Level 1.1  0.8 - 2.0 ng/mL   BASIC METABOLIC PANEL     Status: Abnormal   Collection Time   08/29/12  6:30 AM      Component Value Range Comment   Sodium 138  135 - 145 mEq/L    Potassium 4.2  3.5 - 5.1 mEq/L    Chloride 102  96 - 112 mEq/L    CO2 28  19 - 32 mEq/L    Glucose, Bld 99  70 - 99 mg/dL    BUN 39 (*) 6 - 23 mg/dL    Creatinine, Ser 2.84 (*) 0.50 - 1.35 mg/dL    Calcium 8.9  8.4 - 13.2 mg/dL    GFR calc non Af Amer 41 (*) >90 mL/min    GFR calc Af Amer 48 (*) >90 mL/min   CBC     Status: Abnormal   Collection Time   08/29/12  6:30 AM      Component  Value Range Comment   WBC 6.6  4.0 - 10.5 K/uL    RBC 3.78 (*) 4.22 - 5.81 MIL/uL    Hemoglobin 12.3 (*) 13.0 - 17.0 g/dL    HCT 11.9 (*) 14.7 - 52.0 %    MCV 99.2  78.0 - 100.0 fL    MCH 32.5  26.0 - 34.0 pg    MCHC 32.8  30.0 - 36.0 g/dL    RDW 82.9  56.2 - 13.0 %    Platelets 134 (*) 150 - 400 K/uL     Ct Abdomen Pelvis Wo  Contrast  08/28/2012  *RADIOLOGY REPORT*  Clinical Data: Vomiting, abdominal pain.  CT ABDOMEN AND PELVIS WITHOUT CONTRAST  Technique:  Multidetector CT imaging of the abdomen and pelvis was performed following the standard protocol without intravenous contrast.  Comparison: 08/28/2012 radiograph  Findings: Partially imaged pacer wire terminates within the right ventricle.  Heart size mildly enlarged.  Mild peripheral reticular opacities.  No confluent airspace opacity.  No pleural effusion or pneumothorax.  Organ abnormality/lesion detection is limited in the absence of intravenous contrast. Within this limitation, subcentimeter hypodensity within segment 5/6 as seen on image 27 is nonspecific. Unremarkable spleen, pancreas, adrenal glands. Gallstones.  No biliary ductal dilatation.  Symmetric renal size.  Incompletely characterize hypodensity arising posteriorly from the right kidney, favored to represent a cyst.  No hydronephrosis or hydroureter.  Colonic diverticulosis.  No CT evidence for colitis or diverticulitis.  Normal appendix.  There are dilated loops of small bowel with air-fluid levels, measuring up to 4.5 cm.  The distal small bowel loops are decompressed, with transition point in the right lower quadrant, statistically secondary to underlying adhesions.  Small bowel loops deviate toward the right inguinal canal however without extension into the canal.  There is suggestion of prior right inguinal hernia repair.  Thin-walled bladder.  Scattered atherosclerotic disease of the aorta and branch vessels.  Multilevel degenerative changes of the imaged spine. Postoperative changes of the lower lumbar spine.  No acute or aggressive appearing osseous lesion.  IMPRESSION: Small bowel obstruction with transition point in the right lower quadrant, favored to be secondary to underlying adhesion.   Original Report Authenticated By: Jearld Lesch, M.D.    Dg Abd 2 Views  08/28/2012  *RADIOLOGY REPORT*   Clinical Data: Vomiting, abdominal pain, history of bowel obstruction  ABDOMEN - 2 VIEW  Comparison: None.  Findings: Dilated loops of small bowel in the left upper abdomen.  While the colon is not decompressed, the appearance remains worrisome for early/partial small bowel obstruction.  No evidence of free air under the diaphragm on the upright view.  Degenerative changes of the visualized thoracolumbar spine.  Cardiomegaly.  Pacemaker lead, incompletely visualized.  IMPRESSION: Dilated loops of small bowel in the left upper abdomen, worrisome for early/partial small bowel obstruction.   Original Report Authenticated By: Charline Bills, M.D.     Review of Systems  Constitutional: Positive for chills and weight loss (25 lbs since June last year.). Negative for fever, malaise/fatigue and diaphoresis.  HENT: Positive for hearing loss ([facility] says its the hardest thing to keep up with.) and neck pain.   Eyes: Negative.   Respiratory: Negative.   Cardiovascular: Positive for leg swelling (some, he's not sure if he has any now).  Gastrointestinal: Positive for heartburn, nausea, vomiting, abdominal pain (reported it as around the umbilicus, none currently) and constipation. Negative for blood in stool and melena.  Genitourinary: Positive for frequency.  Frequency, and decreased urine output recently  Musculoskeletal:       He has arthritis discomfort in his shoulders, hips, and knees  Skin: Positive for itching (ON his back, common problem at night.).  Neurological: Negative.  Negative for weakness.  Psychiatric/Behavioral: Negative.    Blood pressure 117/65, pulse 86, temperature 97.5 F (36.4 C), temperature source Oral, resp. rate 18, height 5\' 9"  (1.753 m), weight 150 lb (68.04 kg), SpO2 96.00%. Physical Exam  Constitutional: He is oriented to person, place, and time. He appears well-developed and well-nourished.       He's alert and oriented, but has some memory issues.   Can't remember primary care doctor.  Thin no discomfort currently, feels better.  HENT:  Head: Atraumatic.  Nose: Nose normal.  Eyes: Conjunctivae normal and EOM are normal. Pupils are equal, round, and reactive to light. Right eye exhibits no discharge. Left eye exhibits no discharge. No scleral icterus.  Neck: Normal range of motion. Neck supple. No JVD present. No tracheal deviation present. No thyromegaly present.  Cardiovascular: Normal rate and normal heart sounds.   No murmur heard.      Decreased distal pulses  Respiratory: Effort normal and breath sounds normal. No stridor. No respiratory distress. He has no wheezes. He has no rales. He exhibits no tenderness.  GI: Soft. Bowel sounds are normal. He exhibits no distension and no mass. There is no tenderness. There is no rebound and no guarding.       Soft, no distension, bowel sounds hyperactive, nontender He has a midline incision and one horizontally at the base of the midline incision.     Musculoskeletal: Normal range of motion. He exhibits no edema and no tenderness.  Lymphadenopathy:    He has no cervical adenopathy.  Neurological: He is alert and oriented to person, place, and time. No cranial nerve deficit.  Skin: Skin is warm and dry. He is not diaphoretic.  Psychiatric: He has a normal mood and affect. His behavior is normal. Judgment and thought content normal.    Assessment/Plan: 1. Partial small bowel obstruction, currently asymptomatic. 2. Constipation 3. Dehydration with mild renal insufficiency 4 Chronic atrial fibrillation with tachybradycardia syndrome and permanent transvenous. Anticoagulants have been discontinued 2007 for frequent falls. 5. Hyperlipidemia 6. BPH 7. 20 year history of tobacco use none since 1960.  Plan:  Currently he is asymptomatic with no nausea or vomiting, and at least one bowel movement last evening. He's been stable without an NG tube throughout the evening. I plan to repeat his  two-view abdomen this morning.  We will probably recommend some enemas to help with his chronic constipation. If he has further nausea and vomiting would recommend placement of an NG tube with low wall intermittent suction.  Dr. Andrey Campanile will see him later this a.m.. Currently he has no abdominal discomfort, nausea or vomiting. We will follow with you. [facility] physician assistant for Dr. Gaynelle Adu.   Elfrida Pixley 08/29/2012, 8:11 AM

## 2012-08-29 NOTE — Consult Note (Signed)
I saw the patient, participated in the history, exam and medical decision making, and concur with the physician assistant's note above.  abd soft, a little full, nontender, no RT/guarding; nold incisions - no evidence of hernia  Npo If vomits - Ng tube Enema Will follow Mary Sella. Andrey Campanile, MD, FACS General, Bariatric, & Minimally Invasive Surgery [facility], [place]

## 2012-08-29 NOTE — Progress Notes (Signed)
Patient refusing SCDs Stanford Breed RN 08-29-2012 13:12pm

## 2012-08-29 NOTE — Progress Notes (Signed)
[facility] PROGRESS NOTE  [name] ZOX:[id number] DOB: 03-22-1923 DOA: 08/28/2012 PCP: [name], MD  Assessment/Plan: 1. PSBO - General surgery on board - Patient had BM today - CT of abdomen: Small bowel obstruction with transition point in the right lower quadrant, favored to be secondary to underlying adhesion - Place on MIVF while patient is NPO - Diet advancement per surgery  2. Dehydration - Duet to # 1 - maintain on MIVF at this point  3. Nausea and vomiting - has subsided - if worsens patient to get NG placed  4. Atrial fibrillation.  - Pt on metoprolol - off anticoagulants due to h/o falls  5. AKI - likely due to prerenal etiology given # 1-3 - IVF reassess next am  Code Status: full Family Communication: full Disposition Plan: Pending improvement in SBO and advancement of diet   Consultants:  General surgery  Procedures:  none  Antibiotics:  None  HPI/Subjective: Patient denies any nausea and emesis. States he had a BM earlier today.  Mentions he has had intraabdominal surgery in the past for hernia repair.  Objective: Filed Vitals:   08/28/12 2355 08/29/12 0006 08/29/12 0557 08/29/12 1400  BP: 116/92 118/73 117/65 115/78  Pulse: 78 80 86 80  Temp:  97.5 F (36.4 C) 97.5 F (36.4 C) 98.2 F (36.8 C)  TempSrc:  Oral Oral Oral  Resp:  18 18 20   Height:      Weight:      SpO2:  96% 96% 95%    Intake/Output Summary (Last 24 hours) at 08/29/12 1857 Last data filed at 08/29/12 1448  Gross per 24 hour  Intake 778.75 ml  Output      6 ml  Net 772.75 ml   Filed Weights   08/28/12 1919  Weight: 68.04 kg (150 lb)    Exam:   General:  Pt in NAD, Alert and Awake  Cardiovascular: irregular regular, no mrg  Respiratory: CTA BL, no wheezes  Abdomen: soft, ND, hypoactive bowel sounds  Data Reviewed: Basic Metabolic Panel:  Lab 08/29/12 8119 08/28/12 2100  NA 138 139  K 4.2 4.2  CL 102 98  CO2 28 28    GLUCOSE 99 142*  BUN 39* 39*  CREATININE 1.46* 1.58*  CALCIUM 8.9 9.6  MG -- --  PHOS -- --   Liver Function Tests:  Lab 08/28/12 2100  AST 24  ALT 13  ALKPHOS 53  BILITOT 0.8  PROT 7.8  ALBUMIN 4.3   No results found for this basename: LIPASE:5,AMYLASE:5 in the last 168 hours No results found for this basename: AMMONIA:5 in the last 168 hours CBC:  Lab 08/29/12 0630 08/28/12 2100  WBC 6.6 7.9  NEUTROABS -- --  HGB 12.3* 13.8  HCT 37.5* 42.1  MCV 99.2 100.0  PLT 134* 140*   Cardiac Enzymes: No results found for this basename: CKTOTAL:5,CKMB:5,CKMBINDEX:5,TROPONINI:5 in the last 168 hours BNP (last 3 results) No results found for this basename: PROBNP:3 in the last 8760 hours CBG: No results found for this basename: GLUCAP:5 in the last 168 hours  No results found for this or any previous visit (from the past 240 hour(s)).   Studies: Ct Abdomen Pelvis Wo Contrast  08/28/2012  *RADIOLOGY REPORT*  Clinical Data: Vomiting, abdominal pain.  CT ABDOMEN AND PELVIS WITHOUT CONTRAST  Technique:  Multidetector CT imaging of the abdomen and pelvis was performed following the standard protocol without intravenous contrast.  Comparison: 08/28/2012 radiograph  Findings: Partially imaged pacer wire  terminates within the right ventricle.  Heart size mildly enlarged.  Mild peripheral reticular opacities.  No confluent airspace opacity.  No pleural effusion or pneumothorax.  Organ abnormality/lesion detection is limited in the absence of intravenous contrast. Within this limitation, subcentimeter hypodensity within segment 5/6 as seen on image 27 is nonspecific. Unremarkable spleen, pancreas, adrenal glands. Gallstones.  No biliary ductal dilatation.  Symmetric renal size.  Incompletely characterize hypodensity arising posteriorly from the right kidney, favored to represent a cyst.  No hydronephrosis or hydroureter.  Colonic diverticulosis.  No CT evidence for colitis or diverticulitis.   Normal appendix.  There are dilated loops of small bowel with air-fluid levels, measuring up to 4.5 cm.  The distal small bowel loops are decompressed, with transition point in the right lower quadrant, statistically secondary to underlying adhesions.  Small bowel loops deviate toward the right inguinal canal however without extension into the canal.  There is suggestion of prior right inguinal hernia repair.  Thin-walled bladder.  Scattered atherosclerotic disease of the aorta and branch vessels.  Multilevel degenerative changes of the imaged spine. Postoperative changes of the lower lumbar spine.  No acute or aggressive appearing osseous lesion.  IMPRESSION: Small bowel obstruction with transition point in the right lower quadrant, favored to be secondary to underlying adhesion.   Original Report Authenticated By: Jearld Lesch, M.D.    Dg Abd 2 Views  08/29/2012  *RADIOLOGY REPORT*  Clinical Data: Nausea, vomiting, constipation  ABDOMEN - 2 VIEW  Comparison: None.  Findings: Distended small bowel loop in the left upper abdomen with air-fluid levels suspicious for ileus or early bowel obstruction. Stool noted in the colon.  No free abdominal air.  IMPRESSION: Distended small bowel loop in the left abdomen with air-fluid levels suspicious for ileus or early bowel obstruction.   Original Report Authenticated By: Natasha Mead, M.D.    Dg Abd 2 Views  08/28/2012  *RADIOLOGY REPORT*  Clinical Data: Vomiting, abdominal pain, history of bowel obstruction  ABDOMEN - 2 VIEW  Comparison: None.  Findings: Dilated loops of small bowel in the left upper abdomen.  While the colon is not decompressed, the appearance remains worrisome for early/partial small bowel obstruction.  No evidence of free air under the diaphragm on the upright view.  Degenerative changes of the visualized thoracolumbar spine.  Cardiomegaly.  Pacemaker lead, incompletely visualized.  IMPRESSION: Dilated loops of small bowel in the left upper  abdomen, worrisome for early/partial small bowel obstruction.   Original Report Authenticated By: Charline Bills, M.D.     Scheduled Meds:   . metoprolol succinate  50 mg Oral Daily  . Tamsulosin HCl  0.4 mg Oral Daily   Continuous Infusions:   . dextrose 5 % and 0.45 % NaCl with KCl 20 mEq/L      Principal Problem:  *Partial small bowel obstruction Active Problems:  Atrial fibrillation  Pacemaker-[employer]  AKI (acute kidney injury)  Abdominal pain, acute  Nausea and vomiting  Dehydration    Time spent: > 35 minutes    Penny Pia  [facility] Pager [phone]. If 8PM-8AM, please contact night-coverage at [email or web address], password [facility] 08/29/2012, 6:57 PM  LOS: 1 day

## 2012-08-29 NOTE — Care Management Note (Signed)
    Page 1 of 1   08/29/2012     12:56:32 PM   CARE MANAGEMENT NOTE 08/29/2012  Patient:  [name], [name]   Account Number:  [id number]  Date Initiated:  08/29/2012  Documentation initiated by:  Lorenda Ishihara  Subjective/Objective Assessment:   79 yo male admitted with SBO. PTA lived at home with wife.     Action/Plan:   Home when stable   Anticipated DC Date:  09/01/2012   Anticipated DC Plan:  HOME/SELF CARE      DC Planning Services  CM consult      Choice offered to / List presented to:             Status of service:  Completed, signed off Medicare Important Message given?   (If response is "NO", the following Medicare IM given date fields will be blank) Date Medicare IM given:   Date Additional Medicare IM given:    Discharge Disposition:  HOME/SELF CARE  Per UR Regulation:  Reviewed for med. necessity/level of care/duration of stay  If discussed at Long Length of Stay Meetings, dates discussed:    Comments:

## 2012-08-30 ENCOUNTER — Inpatient Hospital Stay (HOSPITAL_COMMUNITY): Payer: Medicare Other

## 2012-08-30 MED ORDER — ACETAMINOPHEN 325 MG PO TABS
650.0000 mg | ORAL_TABLET | Freq: Four times a day (QID) | ORAL | Status: DC | PRN
  Administered 2012-08-30 – 2012-09-01 (×3): 650 mg via ORAL
  Filled 2012-08-30 (×3): qty 2

## 2012-08-30 MED ORDER — ENOXAPARIN SODIUM 30 MG/0.3ML ~~LOC~~ SOLN
30.0000 mg | SUBCUTANEOUS | Status: DC
  Administered 2012-08-30: 30 mg via SUBCUTANEOUS
  Filled 2012-08-30 (×2): qty 0.3

## 2012-08-30 NOTE — Progress Notes (Addendum)
C/o some worsening Left sided abd pain this am. Burped some. +flatus this am. BM yesterday after enema  Alert, nad Soft, a little full. Very MILD TTP LLQ  psbo Awaiting AM films - if still has dilated loops of SB, would rec NG tube placement since pt c/o belly pain and nausea.  Cont nonsurgical mgmt for now (no fever, tachy, or wbc)  Today's xrays reviewed - bowel gas pattern improved. So will hold off on NG unless pt vomits.  Pt can have water and  Ice chips  Mary Sella. Andrey Campanile, MD, FACS General, Bariatric, & Minimally Invasive Surgery [facility], [place]

## 2012-08-30 NOTE — Progress Notes (Signed)
[facility] PROGRESS NOTE  [name]:[id number] DOB: [birth year] DOA: 08/28/2012 PCP: [name], MD  Assessment/Plan: 1. PSBO - General surgery following - CT of abdomen: Small bowel obstruction with transition point in the right lower quadrant, favored to be secondary to underlying adhesion - Continue IVF, supportive care, ice chips,  - KUB with slight improvement, hold off on NGT  2. Dehydration - continue IVF  3. Atrial fibrillation.  - Pt on metoprolol - off anticoagulants due to h/o falls  4. AKI on CKD, improved, baseline creatinine of 1.5, back to baseline now -gentle IVF  DVT proph: lovenox  Code Status: full Family Communication: d/w pt at bedside Disposition Plan: home when improved   Consultants:  General surgery  Procedures:  none  Antibiotics:  None  HPI/Subjective: Patient reports nausea, flatus, mild abd pain  Objective: Filed Vitals:   08/29/12 0557 08/29/12 1400 08/29/12 2210 08/30/12 0608  BP: 117/65 115/78 100/75 110/67  Pulse: 86 80 83 82  Temp: 97.5 F (36.4 C) 98.2 F (36.8 C) 98.1 F (36.7 C) 98.5 F (36.9 C)  TempSrc: Oral Oral Oral Oral  Resp: 18 20 18 18   Height:      Weight:      SpO2: 96% 95% 96% 95%    Intake/Output Summary (Last 24 hours) at 08/30/12 1316 Last data filed at 08/30/12 1219  Gross per 24 hour  Intake   1355 ml  Output    851 ml  Net    504 ml   Filed Weights   08/28/12 1919  Weight: 68.04 kg (150 lb)    Exam:   General:  Pt in NAD, Alert and Awake  Cardiovascular: irregular regular, no mrg  Respiratory: CTA BL, no wheezes  Abdomen: soft, ND, hypoactive bowel sounds  Ext: no edema c/c  Data Reviewed: Basic Metabolic Panel:  Lab 08/29/12 8119 08/28/12 2100  NA 138 139  K 4.2 4.2  CL 102 98  CO2 28 28  GLUCOSE 99 142*  BUN 39* 39*  CREATININE 1.46* 1.58*  CALCIUM 8.9 9.6  MG -- --  PHOS -- --   Liver Function Tests:  Lab 08/28/12 2100  AST 24  ALT  13  ALKPHOS 53  BILITOT 0.8  PROT 7.8  ALBUMIN 4.3   No results found for this basename: LIPASE:5,AMYLASE:5 in the last 168 hours No results found for this basename: AMMONIA:5 in the last 168 hours CBC:  Lab 08/29/12 0630 08/28/12 2100  WBC 6.6 7.9  NEUTROABS -- --  HGB 12.3* 13.8  HCT 37.5* 42.1  MCV 99.2 100.0  PLT 134* 140*   Cardiac Enzymes: No results found for this basename: CKTOTAL:5,CKMB:5,CKMBINDEX:5,TROPONINI:5 in the last 168 hours BNP (last 3 results) No results found for this basename: PROBNP:3 in the last 8760 hours CBG: No results found for this basename: GLUCAP:5 in the last 168 hours  No results found for this or any previous visit (from the past 240 hour(s)).   Studies: Ct Abdomen Pelvis Wo Contrast  08/28/2012  *RADIOLOGY REPORT*  Clinical Data: Vomiting, abdominal pain.  CT ABDOMEN AND PELVIS WITHOUT CONTRAST  Technique:  Multidetector CT imaging of the abdomen and pelvis was performed following the standard protocol without intravenous contrast.  Comparison: 08/28/2012 radiograph  Findings: Partially imaged pacer wire terminates within the right ventricle.  Heart size mildly enlarged.  Mild peripheral reticular opacities.  No confluent airspace opacity.  No pleural effusion or pneumothorax.  Organ abnormality/lesion detection is limited in the absence  of intravenous contrast. Within this limitation, subcentimeter hypodensity within segment 5/6 as seen on image 27 is nonspecific. Unremarkable spleen, pancreas, adrenal glands. Gallstones.  No biliary ductal dilatation.  Symmetric renal size.  Incompletely characterize hypodensity arising posteriorly from the right kidney, favored to represent a cyst.  No hydronephrosis or hydroureter.  Colonic diverticulosis.  No CT evidence for colitis or diverticulitis.  Normal appendix.  There are dilated loops of small bowel with air-fluid levels, measuring up to 4.5 cm.  The distal small bowel loops are decompressed, with  transition point in the right lower quadrant, statistically secondary to underlying adhesions.  Small bowel loops deviate toward the right inguinal canal however without extension into the canal.  There is suggestion of prior right inguinal hernia repair.  Thin-walled bladder.  Scattered atherosclerotic disease of the aorta and branch vessels.  Multilevel degenerative changes of the imaged spine. Postoperative changes of the lower lumbar spine.  No acute or aggressive appearing osseous lesion.  IMPRESSION: Small bowel obstruction with transition point in the right lower quadrant, favored to be secondary to underlying adhesion.   Original Report Authenticated By: Jearld Lesch, M.D.    Dg Abd 2 Views  08/30/2012  *RADIOLOGY REPORT*  Clinical Data: Small bowel obstruction.  ABDOMEN - 2 VIEW  Comparison: 08/29/2012.  Findings: No free air.  Some contrast and the right colon.  The colon is not distended.  There is a single loop of slightly distended small bowel in the left upper abdomen and some air within nondistended small bowel loops in the lower abdomen. There are a few air fluid levels in the small bowel on the left lateral decubitus view.  IMPRESSION: There is still some air in the small bowel and at least one mildly distended loop of small bowel. This represents interval improvement compared to the study of the preceding day.  No other interval changes.   Original Report Authenticated By: Sander Radon, M.D.    Dg Abd 2 Views  08/29/2012  *RADIOLOGY REPORT*  Clinical Data: Nausea, vomiting, constipation  ABDOMEN - 2 VIEW  Comparison: None.  Findings: Distended small bowel loop in the left upper abdomen with air-fluid levels suspicious for ileus or early bowel obstruction. Stool noted in the colon.  No free abdominal air.  IMPRESSION: Distended small bowel loop in the left abdomen with air-fluid levels suspicious for ileus or early bowel obstruction.   Original Report Authenticated By: Natasha Mead, M.D.     Dg Abd 2 Views  08/28/2012  *RADIOLOGY REPORT*  Clinical Data: Vomiting, abdominal pain, history of bowel obstruction  ABDOMEN - 2 VIEW  Comparison: None.  Findings: Dilated loops of small bowel in the left upper abdomen.  While the colon is not decompressed, the appearance remains worrisome for early/partial small bowel obstruction.  No evidence of free air under the diaphragm on the upright view.  Degenerative changes of the visualized thoracolumbar spine.  Cardiomegaly.  Pacemaker lead, incompletely visualized.  IMPRESSION: Dilated loops of small bowel in the left upper abdomen, worrisome for early/partial small bowel obstruction.   Original Report Authenticated By: Charline Bills, M.D.     Scheduled Meds:    . metoprolol succinate  50 mg Oral Daily  . Tamsulosin HCl  0.4 mg Oral Daily   Continuous Infusions:    . dextrose 5 % and 0.45 % NaCl with KCl 20 mEq/L 100 mL/hr at 08/30/12 8413    Principal Problem:  *Partial small bowel obstruction Active Problems:  Atrial fibrillation  Pacemaker-[employer]  AKI (acute kidney injury)  Abdominal pain, acute  Nausea and vomiting  Dehydration    Time spent: > 35 minutes    [facility]  [facility] Pager [phone]. If 8PM-8AM, please contact night-coverage at [email or web address], password [facility] 08/30/2012, 1:16 PM  LOS: 2 days

## 2012-08-30 NOTE — Progress Notes (Signed)
Patient ID: [name], male   DOB: 11/06/1922, 79 y.o.   MRN: [id number]    Subjective: Pt states he is passing lots of flatus, but is still nauseous.  He is hungry. C/o some increase in abdominal pain this morning.  Objective: Vital signs in last 24 hours: Temp:  [98.1 F (36.7 C)-98.5 F (36.9 C)] 98.5 F (36.9 C) (01/29 2841) Pulse Rate:  [80-83] 82  (01/29 0608) Resp:  [18-20] 18  (01/29 0608) BP: (100-115)/(67-78) 110/67 mmHg (01/29 0608) SpO2:  [95 %-96 %] 95 % (01/29 0608) Last BM Date: 08/29/12  Intake/Output from previous day: 01/28 0701 - 01/29 0700 In: 1299.2 [I.V.:1299.2] Out: 553 [Urine:552; Stool:1] Intake/Output this shift: Total I/O In: -  Out: 300 [Urine:300]  PE: Abd: soft, mild distention, some slight tenderness on left side of abdomen. +BS Heart: regular  Lab Results:   Basename 08/29/12 0630 08/28/12 2100  WBC 6.6 7.9  HGB 12.3* 13.8  HCT 37.5* 42.1  PLT 134* 140*   BMET  Basename 08/29/12 0630 08/28/12 2100  NA 138 139  K 4.2 4.2  CL 102 98  CO2 28 28  GLUCOSE 99 142*  BUN 39* 39*  CREATININE 1.46* 1.58*  CALCIUM 8.9 9.6   PT/INR No results found for this basename: LABPROT:2,INR:2 in the last 72 hours CMP     Component Value Date/Time   NA 138 08/29/2012 0630   K 4.2 08/29/2012 0630   CL 102 08/29/2012 0630   CO2 28 08/29/2012 0630   GLUCOSE 99 08/29/2012 0630   BUN 39* 08/29/2012 0630   CREATININE 1.46* 08/29/2012 0630   CALCIUM 8.9 08/29/2012 0630   PROT 7.8 08/28/2012 2100   ALBUMIN 4.3 08/28/2012 2100   AST 24 08/28/2012 2100   ALT 13 08/28/2012 2100   ALKPHOS 53 08/28/2012 2100   BILITOT 0.8 08/28/2012 2100   GFRNONAA 41* 08/29/2012 0630   GFRAA 48* 08/29/2012 0630   Lipase  No results found for this basename: lipase       Studies/Results: Ct Abdomen Pelvis Wo Contrast  08/28/2012  *RADIOLOGY REPORT*  Clinical Data: Vomiting, abdominal pain.  CT ABDOMEN AND PELVIS WITHOUT CONTRAST  Technique:  Multidetector CT imaging of  the abdomen and pelvis was performed following the standard protocol without intravenous contrast.  Comparison: 08/28/2012 radiograph  Findings: Partially imaged pacer wire terminates within the right ventricle.  Heart size mildly enlarged.  Mild peripheral reticular opacities.  No confluent airspace opacity.  No pleural effusion or pneumothorax.  Organ abnormality/lesion detection is limited in the absence of intravenous contrast. Within this limitation, subcentimeter hypodensity within segment 5/6 as seen on image 27 is nonspecific. Unremarkable spleen, pancreas, adrenal glands. Gallstones.  No biliary ductal dilatation.  Symmetric renal size.  Incompletely characterize hypodensity arising posteriorly from the right kidney, favored to represent a cyst.  No hydronephrosis or hydroureter.  Colonic diverticulosis.  No CT evidence for colitis or diverticulitis.  Normal appendix.  There are dilated loops of small bowel with air-fluid levels, measuring up to 4.5 cm.  The distal small bowel loops are decompressed, with transition point in the right lower quadrant, statistically secondary to underlying adhesions.  Small bowel loops deviate toward the right inguinal canal however without extension into the canal.  There is suggestion of prior right inguinal hernia repair.  Thin-walled bladder.  Scattered atherosclerotic disease of the aorta and branch vessels.  Multilevel degenerative changes of the imaged spine. Postoperative changes of the lower lumbar spine.  No acute  or aggressive appearing osseous lesion.  IMPRESSION: Small bowel obstruction with transition point in the right lower quadrant, favored to be secondary to underlying adhesion.   Original Report Authenticated By: Jearld Lesch, M.D.    Dg Abd 2 Views  08/29/2012  *RADIOLOGY REPORT*  Clinical Data: Nausea, vomiting, constipation  ABDOMEN - 2 VIEW  Comparison: None.  Findings: Distended small bowel loop in the left upper abdomen with air-fluid levels  suspicious for ileus or early bowel obstruction. Stool noted in the colon.  No free abdominal air.  IMPRESSION: Distended small bowel loop in the left abdomen with air-fluid levels suspicious for ileus or early bowel obstruction.   Original Report Authenticated By: Natasha Mead, M.D.    Dg Abd 2 Views  08/28/2012  *RADIOLOGY REPORT*  Clinical Data: Vomiting, abdominal pain, history of bowel obstruction  ABDOMEN - 2 VIEW  Comparison: None.  Findings: Dilated loops of small bowel in the left upper abdomen.  While the colon is not decompressed, the appearance remains worrisome for early/partial small bowel obstruction.  No evidence of free air under the diaphragm on the upright view.  Degenerative changes of the visualized thoracolumbar spine.  Cardiomegaly.  Pacemaker lead, incompletely visualized.  IMPRESSION: Dilated loops of small bowel in the left upper abdomen, worrisome for early/partial small bowel obstruction.   Original Report Authenticated By: Charline Bills, M.D.     Anti-infectives: Anti-infectives    None       Assessment/Plan  1. PSBO 2. Constipation  Plan: 1. Will recheck abdominal films this morning.  Patient states he is passing lots of flatus, but is still sick on his stomach.  Will await films to determine if he can drink.   LOS: 2 days    Anise Harbin E 08/30/2012, 8:51 AM Pager: [phone]

## 2012-08-31 LAB — BASIC METABOLIC PANEL
BUN: 19 mg/dL (ref 6–23)
Creatinine, Ser: 1.16 mg/dL (ref 0.50–1.35)
GFR calc Af Amer: 63 mL/min — ABNORMAL LOW (ref >90)
GFR calc non Af Amer: 54 mL/min — ABNORMAL LOW (ref >90)
Glucose, Bld: 98 mg/dL (ref 70–99)

## 2012-08-31 MED ORDER — ENOXAPARIN SODIUM 40 MG/0.4ML ~~LOC~~ SOLN
40.0000 mg | SUBCUTANEOUS | Status: DC
  Administered 2012-08-31 – 2012-09-01 (×2): 40 mg via SUBCUTANEOUS
  Filled 2012-08-31 (×3): qty 0.4

## 2012-08-31 MED ORDER — DIGOXIN 250 MCG PO TABS
0.2500 mg | ORAL_TABLET | Freq: Every day | ORAL | Status: DC
  Administered 2012-08-31 – 2012-09-02 (×3): 0.25 mg via ORAL
  Filled 2012-08-31 (×3): qty 1

## 2012-08-31 NOTE — Progress Notes (Signed)
Soft, nt, nd  Clears  [name]. Antonio Campanile, MD, FACS General, Bariatric, & Minimally Invasive Surgery [facility] Surgery, [place]

## 2012-08-31 NOTE — Progress Notes (Signed)
[facility] PROGRESS NOTE  [name] BJY:[id number] DOB: 22-Jun-1923 DOA: 08/28/2012 PCP: [name], MD  Assessment/Plan: 1. PSBO - slowly improving - General surgery following - CT of abdomen: Small bowel obstruction with transition point in the right lower quadrant, favored to be secondary to underlying adhesion - Continue IVF, supportive care, ice chips, advance diet to clears today - KUB 1/29 with some improvement, held off on NGT  2. Dehydration - continue IVF  3. Atrial fibrillation.  - Pt on metoprolol, resume digoxin - off anticoagulants due to h/o falls  4. AKI on CKD, improved, baseline creatinine of 1.5, back to baseline now -gentle IVF  DVT proph: lovenox  Code Status: full Family Communication: d/w pt at bedside Disposition Plan: home when improved   Consultants:  General surgery  Procedures:  none  Antibiotics:  None  HPI/Subjective: Feels better, nausea improved, reports mild abd pain on L side  Objective: Filed Vitals:   08/30/12 0608 08/30/12 1600 08/30/12 2053 08/31/12 0554  BP: 110/67 112/92 134/68 102/63  Pulse: 82 67 67 60  Temp: 98.5 F (36.9 C) 98.1 F (36.7 C) 98 F (36.7 C) 98.1 F (36.7 C)  TempSrc: Oral Oral Oral Oral  Resp: 18 18 16 16   Height:      Weight:      SpO2: 95% 99% 96% 95%    Intake/Output Summary (Last 24 hours) at 08/31/12 0923 Last data filed at 08/31/12 0700  Gross per 24 hour  Intake 2471.66 ml  Output   1325 ml  Net 1146.66 ml   Filed Weights   08/28/12 1919  Weight: 68.04 kg (150 lb)    Exam:   General:  Pt in NAD, Alert and Awake  Cardiovascular: irregular regular, no mrg  Respiratory: CTA BL, no wheezes  Abdomen: soft, mild LLQ tenderness, ND, hypoactive bowel sounds  Ext: no edema c/c  Data Reviewed: Basic Metabolic Panel:  Lab 08/31/12 0865 08/29/12 0630 08/28/12 2100  NA 138 138 139  K 4.1 4.2 4.2  CL 106 102 98  CO2 28 28 28   GLUCOSE 98 99 142*  BUN  19 39* 39*  CREATININE 1.16 1.46* 1.58*  CALCIUM 8.3* 8.9 9.6  MG -- -- --  PHOS -- -- --   Liver Function Tests:  Lab 08/28/12 2100  AST 24  ALT 13  ALKPHOS 53  BILITOT 0.8  PROT 7.8  ALBUMIN 4.3   No results found for this basename: LIPASE:5,AMYLASE:5 in the last 168 hours No results found for this basename: AMMONIA:5 in the last 168 hours CBC:  Lab 08/29/12 0630 08/28/12 2100  WBC 6.6 7.9  NEUTROABS -- --  HGB 12.3* 13.8  HCT 37.5* 42.1  MCV 99.2 100.0  PLT 134* 140*   Cardiac Enzymes: No results found for this basename: CKTOTAL:5,CKMB:5,CKMBINDEX:5,TROPONINI:5 in the last 168 hours BNP (last 3 results) No results found for this basename: PROBNP:3 in the last 8760 hours CBG: No results found for this basename: GLUCAP:5 in the last 168 hours  No results found for this or any previous visit (from the past 240 hour(s)).   Studies: Dg Abd 2 Views  08/30/2012  *RADIOLOGY REPORT*  Clinical Data: Small bowel obstruction.  ABDOMEN - 2 VIEW  Comparison: 08/29/2012.  Findings: No free air.  Some contrast and the right colon.  The colon is not distended.  There is a single loop of slightly distended small bowel in the left upper abdomen and some air within nondistended small bowel loops  in the lower abdomen. There are a few air fluid levels in the small bowel on the left lateral decubitus view.  IMPRESSION: There is still some air in the small bowel and at least one mildly distended loop of small bowel. This represents interval improvement compared to the study of the preceding day.  No other interval changes.   Original Report Authenticated By: Sander Radon, M.D.     Scheduled Meds:    . digoxin  0.25 mg Oral Daily  . enoxaparin (LOVENOX) injection  40 mg Subcutaneous Q24H  . metoprolol succinate  50 mg Oral Daily  . Tamsulosin HCl  0.4 mg Oral Daily   Continuous Infusions:    . dextrose 5 % and 0.45 % NaCl with KCl 20 mEq/L 100 mL/hr at 08/31/12 7829    Principal  Problem:  *Partial small bowel obstruction Active Problems:  Atrial fibrillation  Pacemaker-[employer]  AKI (acute kidney injury)  Abdominal pain, acute  Nausea and vomiting  Dehydration    Time spent: > 25 minutes    [facility]  [facility] Pager [phone]. If 8PM-8AM, please contact night-coverage at [email or web address], password [facility] 08/31/2012, 9:23 AM  LOS: 3 days

## 2012-08-31 NOTE — Progress Notes (Signed)
Patient ID: [name], male   DOB: [birth year], 79 y.o.   MRN: [id number]    Subjective: Pt feels ok.  No further nausea.  Continues to pass flatus.  Still sore, but pain improved from yesterday.  Objective: Vital signs in last 24 hours: Temp:  [98 F (36.7 C)-98.1 F (36.7 C)] 98.1 F (36.7 C) (01/30 0554) Pulse Rate:  [60-67] 60  (01/30 0554) Resp:  [16-18] 16  (01/30 0554) BP: (102-134)/(63-92) 102/63 mmHg (01/30 0554) SpO2:  [95 %-99 %] 95 % (01/30 0554) Last BM Date: 08/29/12  Intake/Output from previous day: 2022-09-20 0701 - 01/30 0700 In: 2471.7 [I.V.:2471.7] Out: 1625 [Urine:1625] Intake/Output this shift:    PE: Abd: soft, minimal tenderness in central portion , +BS, ND  Lab Results:   [facility] 08/29/12 0630 08/28/12 2100  WBC 6.6 7.9  HGB 12.3* 13.8  HCT 37.5* 42.1  PLT 134* 140*   BMET  Basename 08/31/12 0444 08/29/12 0630  NA 138 138  K 4.1 4.2  CL 106 102  CO2 28 28  GLUCOSE 98 99  BUN 19 39*  CREATININE 1.16 1.46*  CALCIUM 8.3* 8.9   PT/INR No results found for this basename: LABPROT:2,INR:2 in the last 72 hours CMP     Component Value Date/Time   NA 138 08/31/2012 0444   K 4.1 08/31/2012 0444   CL 106 08/31/2012 0444   CO2 28 08/31/2012 0444   GLUCOSE 98 08/31/2012 0444   BUN 19 08/31/2012 0444   CREATININE 1.16 08/31/2012 0444   CALCIUM 8.3* 08/31/2012 0444   PROT 7.8 08/28/2012 2100   ALBUMIN 4.3 08/28/2012 2100   AST 24 08/28/2012 2100   ALT 13 08/28/2012 2100   ALKPHOS 53 08/28/2012 2100   BILITOT 0.8 08/28/2012 2100   GFRNONAA 54* 08/31/2012 0444   GFRAA 63* 08/31/2012 0444   Lipase  No results found for this basename: lipase       Studies/Results: Dg Abd 2 Views  20-Sep-2012  *RADIOLOGY REPORT*  Clinical Data: Small bowel obstruction.  ABDOMEN - 2 VIEW  Comparison: 08/29/2012.  Findings: No free air.  Some contrast and the right colon.  The colon is not distended.  There is a single loop of slightly distended small bowel in the left  upper abdomen and some air within nondistended small bowel loops in the lower abdomen. There are a few air fluid levels in the small bowel on the left lateral decubitus view.  IMPRESSION: There is still some air in the small bowel and at least one mildly distended loop of small bowel. This represents interval improvement compared to the study of the preceding day.  No other interval changes.   Original Report Authenticated By: Sander Radon, M.D.     Anti-infectives: Anti-infectives    None       Assessment/Plan  1. PSBO, improving  Plan: 1. Will give clear liquids today and see how he does.  No further nausea and pain decreased.   LOS: 3 days    Kaylamarie Swickard E 08/31/2012, 9:47 AM Pager: [phone]

## 2012-09-01 NOTE — Progress Notes (Signed)
[facility] PROGRESS NOTE  [name]:[id number] DOB: 05-20-1923 DOA: 08/28/2012 PCP: [name], MD  Assessment/Plan: 1. PSBO - slowly improving - General surgery following - CT of abdomen: Small bowel obstruction with transition point in the right lower quadrant, favored to be secondary to underlying adhesion - Continue IVF, supportive care, ice chips, advance diet to full liquid today - KUB in am  2. Dehydration - resolved, cut down IVF  3. Atrial fibrillation.  - Pt on metoprolol, resume digoxin - off anticoagulants due to h/o falls  4. AKI on CKD, improved, baseline creatinine of 1.5, back to baseline now -gentle IVF  DVT proph: lovenox  Code Status: full Family Communication: d/w pt at bedside Disposition Plan: home when improved   Consultants:  General surgery  Procedures:  none  Antibiotics:  None  HPI/Subjective: Feels better, reports mild abd pain on L side, BM this am  Objective: Filed Vitals:   08/31/12 1108 08/31/12 1400 08/31/12 2106 09/01/12 0541  BP: 126/69 145/79 114/62 121/72  Pulse: 71 74 63 68  Temp:  97.8 F (36.6 C) 98.5 F (36.9 C) 97.8 F (36.6 C)  TempSrc:  Oral Oral Oral  Resp:  18 16 18   Height:      Weight:      SpO2:  96% 99% 99%    Intake/Output Summary (Last 24 hours) at 09/01/12 1035 Last data filed at 09/01/12 0829  Gross per 24 hour  Intake   2190 ml  Output   1100 ml  Net   1090 ml   Filed Weights   08/28/12 1919  Weight: 68.04 kg (150 lb)    Exam:   General:  Pt in NAD, Alert and Awake  Cardiovascular: irregular regular, no mrg  Respiratory: CTA BL, no wheezes  Abdomen: soft, mild LLQ tenderness, ND, hypoactive bowel sounds  Ext: no edema c/c  Data Reviewed: Basic Metabolic Panel:  Lab 08/31/12 8119 08/29/12 0630 08/28/12 2100  NA 138 138 139  K 4.1 4.2 4.2  CL 106 102 98  CO2 28 28 28   GLUCOSE 98 99 142*  BUN 19 39* 39*  CREATININE 1.16 1.46* 1.58*  CALCIUM 8.3*  8.9 9.6  MG -- -- --  PHOS -- -- --   Liver Function Tests:  Lab 08/28/12 2100  AST 24  ALT 13  ALKPHOS 53  BILITOT 0.8  PROT 7.8  ALBUMIN 4.3   No results found for this basename: LIPASE:5,AMYLASE:5 in the last 168 hours No results found for this basename: AMMONIA:5 in the last 168 hours CBC:  Lab 08/29/12 0630 08/28/12 2100  WBC 6.6 7.9  NEUTROABS -- --  HGB 12.3* 13.8  HCT 37.5* 42.1  MCV 99.2 100.0  PLT 134* 140*   Cardiac Enzymes: No results found for this basename: CKTOTAL:5,CKMB:5,CKMBINDEX:5,TROPONINI:5 in the last 168 hours BNP (last 3 results) No results found for this basename: PROBNP:3 in the last 8760 hours CBG: No results found for this basename: GLUCAP:5 in the last 168 hours  No results found for this or any previous visit (from the past 240 hour(s)).   Studies: No results found.  Scheduled Meds:    . digoxin  0.25 mg Oral Daily  . enoxaparin (LOVENOX) injection  40 mg Subcutaneous Q24H  . metoprolol succinate  50 mg Oral Daily  . Tamsulosin HCl  0.4 mg Oral Daily   Continuous Infusions:    . dextrose 5 % and 0.45 % NaCl with KCl 20 mEq/L 50 mL/hr at  09/01/12 0539    Principal Problem:  *Partial small bowel obstruction Active Problems:  Atrial fibrillation  Pacemaker-[employer]  AKI (acute kidney injury)  Abdominal pain, acute  Nausea and vomiting  Dehydration    Time spent: > 25 minutes    [facility]  [facility] Pager [phone]. If 8PM-8AM, please contact night-coverage at [email or web address], password [facility] 09/01/2012, 10:35 AM  LOS: 4 days

## 2012-09-01 NOTE — Progress Notes (Signed)
Patient ID: [name], male   DOB: 02-17-1923, 79 y.o.   MRN: [id number]    Subjective: Pt feels well.  No c/o.  Had BM this am.  Tolerating clear liquids.  Objective: Vital signs in last 24 hours: Temp:  [97.8 F (36.6 C)-98.5 F (36.9 C)] 97.8 F (36.6 C) (01/31 0541) Pulse Rate:  [63-74] 68  (01/31 0541) Resp:  [16-18] 18  (01/31 0541) BP: (114-145)/(62-79) 121/72 mmHg (01/31 0541) SpO2:  [96 %-99 %] 99 % (01/31 0541) Last BM Date: 08/29/12  Intake/Output from previous day: 01/30 0701 - 01/31 0700 In: 2101.7 [P.O.:720; I.V.:1381.7] Out: 800 [Urine:800] Intake/Output this shift: Total I/O In: 420 [P.O.:420] Out: 300 [Urine:300]  PE: Abd: soft, NT, Nd, +BS  Lab Results:  No results found for this basename: WBC:2,HGB:2,HCT:2,PLT:2 in the last 72 hours BMET  [facility] 08/31/12 0444  NA 138  K 4.1  CL 106  CO2 28  GLUCOSE 98  BUN 19  CREATININE 1.16  CALCIUM 8.3*   PT/INR No results found for this basename: LABPROT:2,INR:2 in the last 72 hours CMP     Component Value Date/Time   NA 138 08/31/2012 0444   K 4.1 08/31/2012 0444   CL 106 08/31/2012 0444   CO2 28 08/31/2012 0444   GLUCOSE 98 08/31/2012 0444   BUN 19 08/31/2012 0444   CREATININE 1.16 08/31/2012 0444   CALCIUM 8.3* 08/31/2012 0444   PROT 7.8 08/28/2012 2100   ALBUMIN 4.3 08/28/2012 2100   AST 24 08/28/2012 2100   ALT 13 08/28/2012 2100   ALKPHOS 53 08/28/2012 2100   BILITOT 0.8 08/28/2012 2100   GFRNONAA 54* 08/31/2012 0444   GFRAA 63* 08/31/2012 0444   Lipase  No results found for this basename: lipase       Studies/Results: Dg Abd 2 Views  09/15/2012  *RADIOLOGY REPORT*  Clinical Data: Small bowel obstruction.  ABDOMEN - 2 VIEW  Comparison: 08/29/2012.  Findings: No free air.  Some contrast and the right colon.  The colon is not distended.  There is a single loop of slightly distended small bowel in the left upper abdomen and some air within nondistended small bowel loops in the lower abdomen.  There are a few air fluid levels in the small bowel on the left lateral decubitus view.  IMPRESSION: There is still some air in the small bowel and at least one mildly distended loop of small bowel. This represents interval improvement compared to the study of the preceding day.  No other interval changes.   Original Report Authenticated By: Sander Radon, M.D.     Anti-infectives: Anti-infectives    None       Assessment/Plan  1. PSBO, resolving  Plan: 1. Advance to full liquids today and then regular diet in AM if tolerates fulls today.   LOS: 4 days    Dariella Gillihan E 09/01/2012, 9:34 AM Pager: [phone]

## 2012-09-01 NOTE — Progress Notes (Signed)
Feels better. +BM. Some rare burping but lots of flatus  Alert, sitting in chair nad Soft, a little full. Nontender. Some BS  fulls today If tolerates, solids in am with probable discharge Sat  Mary Sella. Andrey Campanile, MD, FACS General, Bariatric, & Minimally Invasive Surgery [facility], [place]

## 2012-09-02 ENCOUNTER — Inpatient Hospital Stay (HOSPITAL_COMMUNITY): Payer: Medicare Other

## 2012-09-02 MED ORDER — HYDROCODONE-ACETAMINOPHEN 5-500 MG PO TABS: 1.0000 | ORAL_TABLET | Freq: Four times a day (QID) | ORAL | Status: DC | PRN

## 2012-09-02 NOTE — Progress Notes (Signed)
  Subjective: Tolerating regular diet.  Still some bloating but bowels moving.  No nausea or vomiting  Objective: Vital signs in last 24 hours: Temp:  [97.6 F (36.4 C)-98 F (36.7 C)] 97.9 F (36.6 C) (02/01 0521) Pulse Rate:  [64-81] 72  (02/01 0521) Resp:  [16-18] 18  (02/01 0521) BP: (102-135)/(58-79) 122/69 mmHg (02/01 0521) SpO2:  [98 %-100 %] 100 % (02/01 0521) Last BM Date: 09/01/12  Intake/Output from previous day: 01/31 0701 - 02/01 0700 In: 2129.2 [P.O.:900; I.V.:1229.2] Out: 950 [Urine:950] Intake/Output this shift: Total I/O In: 240 [P.O.:240] Out: -   General appearance: alert, cooperative and no distress GI: soft, NT, mild distension (but seems to be improving per patient), no peritoneal signs  Lab Results:  No results found for this basename: WBC:2,HGB:2,HCT:2,PLT:2 in the last 72 hours BMET  [facility] 08/31/12 0444  NA 138  K 4.1  CL 106  CO2 28  GLUCOSE 98  BUN 19  CREATININE 1.16  CALCIUM 8.3*   PT/INR No results found for this basename: LABPROT:2,INR:2 in the last 72 hours ABG No results found for this basename: PHART:2,PCO2:2,PO2:2,HCO3:2 in the last 72 hours  Studies/Results: Dg Abd 1 View  09/02/2012  *RADIOLOGY REPORT*  Clinical Data: Abdominal pain.  Follow-up small bowel obstruction.  ABDOMEN - 1 VIEW  Comparison: 08/30/2012.  Findings: Continued normalization of the bowel gas pattern. Stool and bowel gas extends to the rectosigmoid.  Bowel gas pattern is nonobstructive.  Lumbar spondylosis is incidentally noted.  Thoracic spine DISH.  There is no gross plain film evidence of free air on these supine radiographs.  IMPRESSION: Essentially normal small bowel gas pattern with resolved small bowel obstruction.   Original Report Authenticated By: Andreas Newport, M.D.     Anti-infectives: Anti-infectives    None      Assessment/Plan: s/p * No surgery found * Advance diet He seems to be doing okay.  Diet and activity as tolerated.  LOS:  5 days    Lodema Pilot DAVID 09/02/2012

## 2012-09-02 NOTE — Progress Notes (Signed)
Bp for 10am 101/56. P=68. Spoke with Dr Jomarie Longs concerning metoprolol. Instructed to give.

## 2012-09-19 NOTE — Discharge Summary (Signed)
Physician Discharge Summary  [name] JXB:[id number] DOB: Jan 13, 1923 DOA: 08/28/2012  PCP: [name], MD  Admit date: 08/28/2012 Discharge date: 09/19/2012  Time spent: 35 minutes  Recommendations for Outpatient Follow-up:  1. PCP in 1 week  Discharge Diagnoses:  Principal Problem:   Partial small bowel obstruction Active Problems:   Atrial fibrillation   Pacemaker-[employer]   AKI (acute kidney injury)   Abdominal pain, acute   Nausea and vomiting   Dehydration   Discharge Condition: improved  Diet recommendation: Parke Simmers diet  Filed Weights   08/28/12 1919  Weight: 68.04 kg (150 lb)    History of present illness:  79 yo male with one day of n/v and periumbilical abd pain. No diarrhea. bm today. No fevers. nonbloody vomit. bm was normal also. Has had some relief with nausea and pain meds in ED with ivf. Has psbo.  Hospital Course:  1. PSBO: treated conservatively  - slowly improved with supportive care, NG decompression and bowel rest  - General surgery followed the patient during hospitalization  - diet was gradually advanced and discharged home in improved condition  2. Dehydration  - resolved, cut down IVF   3. Atrial fibrillation.  - Pt on metoprolol, resume digoxin  - off anticoagulants due to h/o falls   4. AKI on CKD, improved, baseline creatinine of 1.5, back to baseline now  -gentle IVF   Procedures:  none  Consultations:  CCS  Discharge Exam: Filed Vitals:   09/01/12 1450 09/01/12 2145 09/02/12 0521 09/02/12 1018  BP: 130/75 135/79 122/69 101/56  Pulse: 68 81 72 68  Temp: 97.6 F (36.4 C) 98 F (36.7 C) 97.9 F (36.6 C) 98 F (36.7 C)  TempSrc: Oral Oral Oral Oral  Resp: 16 18 18 16   Height:      Weight:      SpO2: 100% 98% 100% 97%    General: AAOx3 Cardiovascular: S1S2/RRR Respiratory: CTAB  Discharge Instructions  Discharge Orders   Future Appointments Provider Department Dept Phone   10/09/2012 11:25 AM  [facility] Main Office [place]) [phone]   Future Orders Complete By Expires     Discharge instructions  As directed     Comments:      Bland diet for next 2-3 days    Increase activity slowly  As directed         Medication List    TAKE these medications       acetaminophen 325 MG tablet  Commonly known as:  TYLENOL  Take 325 mg by mouth every 6 (six) hours as needed.     aspirin 325 MG tablet  Take 325 mg by mouth daily.     digoxin 0.25 MG tablet  Commonly known as:  LANOXIN  Take 250 mcg by mouth daily.     HYDROcodone-acetaminophen 5-500 MG per tablet  Commonly known as:  VICODIN  Take 1 tablet by mouth every 6 (six) hours as needed for pain.     metoprolol succinate 50 MG 24 hr tablet  Commonly known as:  TOPROL-XL  Take 1 tablet (50 mg total) by mouth daily.     simvastatin 40 MG tablet  Commonly known as:  ZOCOR  Take 40 mg by mouth at bedtime.     Tamsulosin HCl 0.4 MG Caps  Commonly known as:  FLOMAX  Take 0.4 mg by mouth daily.           Follow-up Information   Follow up with [facility] TOM,  MD In 1 week.   Contact information:   [address] [place] [address] [phone]        The results of significant diagnostics from this hospitalization (including imaging, microbiology, ancillary and laboratory) are listed below for reference.    Significant Diagnostic Studies: Ct Abdomen Pelvis Wo Contrast  08/28/2012  *RADIOLOGY REPORT*  Clinical Data: Vomiting, abdominal pain.  CT ABDOMEN AND PELVIS WITHOUT CONTRAST  Technique:  Multidetector CT imaging of the abdomen and pelvis was performed following the standard protocol without intravenous contrast.  Comparison: 08/28/2012 radiograph  Findings: Partially imaged pacer wire terminates within the right ventricle.  Heart size mildly enlarged.  Mild peripheral reticular opacities.  No confluent airspace opacity.  No pleural effusion or pneumothorax.   Organ abnormality/lesion detection is limited in the absence of intravenous contrast. Within this limitation, subcentimeter hypodensity within segment 5/6 as seen on image 27 is nonspecific. Unremarkable spleen, pancreas, adrenal glands. Gallstones.  No biliary ductal dilatation.  Symmetric renal size.  Incompletely characterize hypodensity arising posteriorly from the right kidney, favored to represent a cyst.  No hydronephrosis or hydroureter.  Colonic diverticulosis.  No CT evidence for colitis or diverticulitis.  Normal appendix.  There are dilated loops of small bowel with air-fluid levels, measuring up to 4.5 cm.  The distal small bowel loops are decompressed, with transition point in the right lower quadrant, statistically secondary to underlying adhesions.  Small bowel loops deviate toward the right inguinal canal however without extension into the canal.  There is suggestion of prior right inguinal hernia repair.  Thin-walled bladder.  Scattered atherosclerotic disease of the aorta and branch vessels.  Multilevel degenerative changes of the imaged spine. Postoperative changes of the lower lumbar spine.  No acute or aggressive appearing osseous lesion.  IMPRESSION: Small bowel obstruction with transition point in the right lower quadrant, favored to be secondary to underlying adhesion.   Original Report Authenticated By: Jearld Lesch, M.D.    Dg Abd 1 View  09/02/2012  *RADIOLOGY REPORT*  Clinical Data: Abdominal pain.  Follow-up small bowel obstruction.  ABDOMEN - 1 VIEW  Comparison: 08/30/2012.  Findings: Continued normalization of the bowel gas pattern. Stool and bowel gas extends to the rectosigmoid.  Bowel gas pattern is nonobstructive.  Lumbar spondylosis is incidentally noted.  Thoracic spine DISH.  There is no gross plain film evidence of free air on these supine radiographs.  IMPRESSION: Essentially normal small bowel gas pattern with resolved small bowel obstruction.   Original Report  Authenticated By: Andreas Newport, M.D.    Dg Abd 2 Views  08/30/2012  *RADIOLOGY REPORT*  Clinical Data: Small bowel obstruction.  ABDOMEN - 2 VIEW  Comparison: 08/29/2012.  Findings: No free air.  Some contrast and the right colon.  The colon is not distended.  There is a single loop of slightly distended small bowel in the left upper abdomen and some air within nondistended small bowel loops in the lower abdomen. There are a few air fluid levels in the small bowel on the left lateral decubitus view.  IMPRESSION: There is still some air in the small bowel and at least one mildly distended loop of small bowel. This represents interval improvement compared to the study of the preceding day.  No other interval changes.   Original Report Authenticated By: Sander Radon, M.D.    Dg Abd 2 Views  08/29/2012  *RADIOLOGY REPORT*  Clinical Data: Nausea, vomiting, constipation  ABDOMEN - 2 VIEW  Comparison: None.  Findings: Distended small bowel loop in the left upper abdomen with air-fluid levels suspicious for ileus or early bowel obstruction. Stool noted in the colon.  No free abdominal air.  IMPRESSION: Distended small bowel loop in the left abdomen with air-fluid levels suspicious for ileus or early bowel obstruction.   Original Report Authenticated By: Natasha Mead, M.D.    Dg Abd 2 Views  08/28/2012  *RADIOLOGY REPORT*  Clinical Data: Vomiting, abdominal pain, history of bowel obstruction  ABDOMEN - 2 VIEW  Comparison: None.  Findings: Dilated loops of small bowel in the left upper abdomen.  While the colon is not decompressed, the appearance remains worrisome for early/partial small bowel obstruction.  No evidence of free air under the diaphragm on the upright view.  Degenerative changes of the visualized thoracolumbar spine.  Cardiomegaly.  Pacemaker lead, incompletely visualized.  IMPRESSION: Dilated loops of small bowel in the left upper abdomen, worrisome for early/partial small bowel obstruction.    Original Report Authenticated By: Charline Bills, M.D.     Microbiology: No results found for this or any previous visit (from the past 240 hour(s)).   Labs: Basic Metabolic Panel: No results found for this basename: NA, K, CL, CO2, GLUCOSE, BUN, CREATININE, CALCIUM, MG, PHOS,  in the last 168 hours Liver Function Tests: No results found for this basename: AST, ALT, ALKPHOS, BILITOT, PROT, ALBUMIN,  in the last 168 hours No results found for this basename: LIPASE, AMYLASE,  in the last 168 hours No results found for this basename: AMMONIA,  in the last 168 hours CBC: No results found for this basename: WBC, NEUTROABS, HGB, HCT, MCV, PLT,  in the last 168 hours Cardiac Enzymes: No results found for this basename: CKTOTAL, CKMB, CKMBINDEX, TROPONINI,  in the last 168 hours BNP: BNP (last 3 results) No results found for this basename: PROBNP,  in the last 8760 hours CBG: No results found for this basename: GLUCAP,  in the last 168 hours     Signed:  Berthel Bagnall  [facility] 09/19/2012, 2:32 PM

## 2012-10-09 ENCOUNTER — Encounter: Payer: Medicare Other | Admitting: *Deleted

## 2012-10-17 ENCOUNTER — Other Ambulatory Visit: Payer: Self-pay | Admitting: Internal Medicine

## 2012-10-17 ENCOUNTER — Ambulatory Visit (INDEPENDENT_AMBULATORY_CARE_PROVIDER_SITE_OTHER): Payer: Medicare Other | Admitting: *Deleted

## 2012-10-17 ENCOUNTER — Encounter: Payer: Self-pay | Admitting: *Deleted

## 2012-10-17 DIAGNOSIS — Z950 Presence of cardiac pacemaker: Secondary | ICD-10-CM

## 2012-10-17 DIAGNOSIS — I495 Sick sinus syndrome: Secondary | ICD-10-CM

## 2012-11-01 LAB — REMOTE PACEMAKER DEVICE
BATTERY VOLTAGE: 2.7 V
BMOD-0003RV: 30
BMOD-0005RV: 95 {beats}/min
BRDY-0004RV: 110 {beats}/min
RV LEAD IMPEDENCE PM: 496 Ohm
RV LEAD THRESHOLD: 0.5 V

## 2012-11-14 ENCOUNTER — Telehealth: Payer: Self-pay | Admitting: Family Medicine

## 2012-11-14 ENCOUNTER — Encounter: Payer: Self-pay | Admitting: *Deleted

## 2012-11-14 MED ORDER — DIGOXIN 250 MCG PO TABS: 250.0000 ug | ORAL_TABLET | Freq: Every day | ORAL | Status: DC

## 2012-11-14 NOTE — Telephone Encounter (Signed)
Rx Refilled  

## 2012-11-20 ENCOUNTER — Encounter: Payer: Self-pay | Admitting: Internal Medicine

## 2012-12-29 ENCOUNTER — Encounter: Payer: Self-pay | Admitting: Cardiology

## 2013-01-03 ENCOUNTER — Encounter: Payer: Self-pay | Admitting: Nurse Practitioner

## 2013-01-03 ENCOUNTER — Ambulatory Visit (INDEPENDENT_AMBULATORY_CARE_PROVIDER_SITE_OTHER): Payer: Medicare Other | Admitting: Nurse Practitioner

## 2013-01-03 VITALS — BP 102/58 | HR 72 | Ht 69.0 in | Wt 155.0 lb

## 2013-01-03 DIAGNOSIS — I4891 Unspecified atrial fibrillation: Secondary | ICD-10-CM

## 2013-01-03 MED ORDER — METOPROLOL SUCCINATE ER 25 MG PO TB24: 25.0000 mg | ORAL_TABLET | Freq: Every day | ORAL | Status: DC

## 2013-01-03 NOTE — Patient Instructions (Addendum)
Stay on your current medicines EXCEPT cut the Toprol back to just a half a tablet a day (25 mg). Let us know when you need a refill and we will send in the prescription for this dose  Stay SAFE!  We will see you back in December for your pacemaker check.  Call the [facility] office at [phone] if you have any questions, problems or concerns.

## 2013-01-03 NOTE — Progress Notes (Addendum)
[name] Date of Birth: Aug 10, 1922 Medical Record #[id number]  History of Present Illness: [name] is seen back today for a 6 month check. Seen for Dr. Johney Frame. He is now 79 years of age. He has chronic atrial fib, no longer on coumadin due to falls and has a pacemaker in place.   Last seen here in December and was doing well.  Comes in today. He is here with his wife. Doing well. Uses 2 canes to walk. Hard of hearing and has 2 hearing aids. No chest pain. Not short of breath. Fell 3 times yesterday trying to get up. Not really dizzy but BP is low here today. No palpitations.   Current Outpatient Prescriptions on File Prior to Visit  Medication Sig Dispense Refill  . acetaminophen (TYLENOL) 325 MG tablet Take 325 mg by mouth every 6 (six) hours as needed.      Marland Kitchen aspirin 325 MG tablet Take 325 mg by mouth daily.      . digoxin (LANOXIN) 0.25 MG tablet Take 1 tablet (250 mcg total) by mouth daily.  30 tablet  3  . metoprolol succinate (TOPROL-XL) 50 MG 24 hr tablet Take 1 tablet (50 mg total) by mouth daily.  90 tablet  3  . simvastatin (ZOCOR) 40 MG tablet Take 40 mg by mouth at bedtime.        . Tamsulosin HCl (FLOMAX) 0.4 MG CAPS Take 0.4 mg by mouth daily.       Marland Kitchen HYDROcodone-acetaminophen (VICODIN) 5-500 MG per tablet Take 1 tablet by mouth every 6 (six) hours as needed for pain.  30 tablet  0   No current facility-administered medications on file prior to visit.    No Known Allergies  Past Medical History  Diagnosis Date  . Hyperlipidemia   . Atrial fibrillation     permanent afib  . Long-term (current) use of anticoagulants     previously on coumadin, stopped by Dr Deborah Chalk due to frequent falls  . BRADYCARDIA-TACHYCARDIA SYNDROME 2007    s/p PPM by Dr Deborah Chalk    Past Surgical History  Procedure Laterality Date  . Abdominal surgery  1986  . Hernia repair    . Pacemaker insertion  01/28/06    PPM  (MDT) implanted by Dr Deborah Chalk 2007    History  Smoking status  .  Former Smoker -- 1.00 packs/day for 25 years  . Types: Cigarettes  . Quit date: 08/02/1961  Smokeless tobacco  . Never Used    History  Alcohol Use No    Family History  Problem Relation Age of Onset  . Heart attack Father     Review of Systems: The review of systems is per the HPI.  All other systems were reviewed and are negative.  Physical Exam: Ht 5\' 9"  (1.753 m)  Wt 155 lb (70.308 kg)  BMI 22.88 kg/m2 Patient is very pleasant and in no acute distress. Skin is warm and dry. Color is normal.  HEENT is unremarkable. Normocephalic/atraumatic. PERRL. Sclera are nonicteric. Neck is supple. No masses. No JVD. Lungs are clear. Cardiac exam shows an irregular rhythm. Rate is controlled. Abdomen is soft. Extremities are without edema. Gait and ROM are intact but he is quite unsteady. Uses 2 canes to walk. No gross neurologic deficits noted.  LABORATORY DATA:  Lab Results  Component Value Date   WBC 6.6 08/29/2012   HGB 12.3* 08/29/2012   HCT 37.5* 08/29/2012   PLT 134* 08/29/2012   GLUCOSE 98 08/31/2012  ALT 13 08/28/2012   AST 24 08/28/2012   NA 138 08/31/2012   K 4.1 08/31/2012   CL 106 08/31/2012   CREATININE 1.16 08/31/2012   BUN 19 08/31/2012   CO2 28 08/31/2012   INR 1.3 11/13/2008     Assessment / Plan: 1. Chronic atrial fib - rate is controlled. No longer on anticoagulation due to falls.   2. Underlying pacemaker - followed by Dr. Johney Frame - sees him in 6 months.   3. Advanced age   82. HTN - BP is quite low for him. Repeat by me was 100/60. I have cut his Metoprolol in half. His wife will monitor his readings at home and let us know if we need to make further adjustments. I would not want him to fall due to hypotension.   Patient is agreeable to this plan and will call if any problems develop in the interim.   Rosalio Macadamia, RN, ANP-C [facility] [address] [place], [place]  [address]   Addendum 01/31/2013 BP diary has been reviewed.  Readings from home are ok. No further change in his regimen. Continue to monitor.

## 2013-01-29 ENCOUNTER — Encounter: Payer: Self-pay | Admitting: Internal Medicine

## 2013-01-29 ENCOUNTER — Ambulatory Visit (INDEPENDENT_AMBULATORY_CARE_PROVIDER_SITE_OTHER): Payer: Medicare Other | Admitting: *Deleted

## 2013-01-29 DIAGNOSIS — Z950 Presence of cardiac pacemaker: Secondary | ICD-10-CM

## 2013-01-29 DIAGNOSIS — I495 Sick sinus syndrome: Secondary | ICD-10-CM

## 2013-01-30 LAB — REMOTE PACEMAKER DEVICE
BMOD-0001RV: LOW
BMOD-0005RV: 95 {beats}/min
BRDY-0002RV: 60 {beats}/min
BRDY-0004RV: 110 {beats}/min
RV LEAD THRESHOLD: 0.625 V
VENTRICULAR PACING PM: 85

## 2013-02-05 ENCOUNTER — Telehealth: Payer: Self-pay | Admitting: Family Medicine

## 2013-02-05 MED ORDER — SIMVASTATIN 40 MG PO TABS: 40.0000 mg | ORAL_TABLET | Freq: Every day | ORAL | Status: DC

## 2013-02-05 NOTE — Telephone Encounter (Signed)
Rx Refilled  

## 2013-02-07 ENCOUNTER — Encounter: Payer: Self-pay | Admitting: *Deleted

## 2013-02-07 ENCOUNTER — Ambulatory Visit
Admission: RE | Admit: 2013-02-07 | Discharge: 2013-02-07 | Disposition: A | Discharge status: Home / Self Care | Payer: Medicare Other | Source: Ambulatory Visit | Attending: Family Medicine | Admitting: Family Medicine

## 2013-02-07 ENCOUNTER — Encounter: Payer: Self-pay | Admitting: Family Medicine

## 2013-02-07 ENCOUNTER — Ambulatory Visit (INDEPENDENT_AMBULATORY_CARE_PROVIDER_SITE_OTHER): Payer: Medicare Other | Admitting: Family Medicine

## 2013-02-07 VITALS — BP 110/72 | HR 68 | Temp 97.5°F | Resp 16 | Wt 149.0 lb

## 2013-02-07 DIAGNOSIS — M549 Dorsalgia, unspecified: Secondary | ICD-10-CM

## 2013-02-07 DIAGNOSIS — IMO0001 Reserved for inherently not codable concepts without codable children: Secondary | ICD-10-CM

## 2013-02-07 DIAGNOSIS — G4710 Hypersomnia, unspecified: Secondary | ICD-10-CM

## 2013-02-07 DIAGNOSIS — M25519 Pain in unspecified shoulder: Secondary | ICD-10-CM

## 2013-02-07 DIAGNOSIS — L600 Ingrowing nail: Secondary | ICD-10-CM

## 2013-02-07 LAB — COMPLETE METABOLIC PANEL WITH GFR
Alkaline Phosphatase: 51 U/L (ref 39–117)
Creat: 1.53 mg/dL — ABNORMAL HIGH (ref 0.50–1.35)
GFR, Est Non African American: 40 mL/min — ABNORMAL LOW
Glucose, Bld: 81 mg/dL (ref 70–99)
Sodium: 139 mEq/L (ref 135–145)
Total Bilirubin: 0.5 mg/dL (ref 0.3–1.2)
Total Protein: 6.5 g/dL (ref 6.0–8.3)

## 2013-02-07 LAB — CBC WITH DIFFERENTIAL/PLATELET
Basophils Absolute: 0 10*3/uL (ref 0.0–0.1)
Eosinophils Absolute: 0.2 10*3/uL (ref 0.0–0.7)
Eosinophils Relative: 4 % (ref 0–5)
Hemoglobin: 12.4 g/dL — ABNORMAL LOW (ref 13.0–17.0)
MCH: 31.6 pg (ref 26.0–34.0)
MCV: 95.9 fL (ref 78.0–100.0)
Platelets: 143 10*3/uL — ABNORMAL LOW (ref 150–400)
RBC: 3.92 MIL/uL — ABNORMAL LOW (ref 4.22–5.81)
RDW: 13.3 % (ref 11.5–15.5)

## 2013-02-07 MED ORDER — PREDNISONE 20 MG PO TABS: ORAL_TABLET | ORAL | Status: DC

## 2013-02-07 NOTE — Progress Notes (Signed)
Subjective:    Patient ID: [name], male    DOB: 04-Jan-1923, 79 y.o.   MRN: [id number]  HPI Patient presents with multiple complaints today. #1 is his right great toenail, lateral nail margin is slightly ingrown.  #2, the patient complains of pain in both shoulders. It is worse with abduction. He reports crepitus. He reports pain with range of motion. The pain is unrelieved by Tylenol.  #3 is weakness and pain in his lower back and posterior hips. He is tender to palpation over the bilateral SI joints. He also complains of pain with range of motion in both hips. He complains of myalgias in his buttock muscles and in his thigh muscles.  #4 is excessive daytime drowsiness. On further questioning, the patient is taking hydroxyzine 10 mg every 8 hours as needed for "pain". Past Medical History  Diagnosis Date  . Hyperlipidemia   . Atrial fibrillation     permanent afib  . Long-term (current) use of anticoagulants     previously on coumadin, stopped by [name] due to frequent falls  . BRADYCARDIA-TACHYCARDIA SYNDROME 2007    s/p PPM by [name]   Past Surgical History  Procedure Laterality Date  . Abdominal surgery  1986  . Hernia repair    . Pacemaker insertion  01/28/06    PPM  (MDT) implanted by [name] 2007   Current Outpatient Prescriptions on File Prior to Visit  Medication Sig Dispense Refill  . acetaminophen (TYLENOL) 325 MG tablet Take 325 mg by mouth every 6 (six) hours as needed.      Marland Kitchen aspirin 325 MG tablet Take 325 mg by mouth daily.      . digoxin (LANOXIN) 0.25 MG tablet Take 1 tablet (250 mcg total) by mouth daily.  30 tablet  3  . fish oil-omega-3 fatty acids 1000 MG capsule Take 1 g by mouth daily.      . hydrOXYzine (ATARAX/VISTARIL) 10 MG tablet Take 10 mg by mouth every 8 (eight) hours as needed.       . metoprolol succinate (TOPROL-XL) 25 MG 24 hr tablet Take 1 tablet (25 mg total) by mouth daily. Take with or immediately following a meal.  90 tablet  3   . polyethylene glycol (MIRALAX / GLYCOLAX) packet Take 17 g by mouth daily.      . simvastatin (ZOCOR) 40 MG tablet Take 1 tablet (40 mg total) by mouth at bedtime.  30 tablet  5  . Tamsulosin HCl (FLOMAX) 0.4 MG CAPS Take 0.4 mg by mouth daily.       Marland Kitchen HYDROcodone-acetaminophen (VICODIN) 5-500 MG per tablet Take 1 tablet by mouth every 6 (six) hours as needed for pain.  30 tablet  0  . ketotifen (ZADITOR) 0.025 % ophthalmic solution Place 1 drop into both eyes as needed.       No current facility-administered medications on file prior to visit.   No Known Allergies History   Social History  . Marital Status: Single    Spouse Name: N/A    Number of Children: N/A  . Years of Education: N/A   Occupational History  . Not on file.   Social History Main Topics  . Smoking status: Former Smoker -- 1.00 packs/day for 25 years    Types: Cigarettes    Quit date: 08/02/1961  . Smokeless tobacco: Never Used  . Alcohol Use: No  . Drug Use: No  . Sexually Active: Not on file   Other Topics Concern  .  Not on file   Social History Narrative  . No narrative on file   Family History  Problem Relation Age of Onset  . Heart attack Father       Review of Systems  All other systems reviewed and are negative.       Objective:   Physical Exam  Vitals reviewed. Constitutional: He appears well-developed and well-nourished.  Cardiovascular: Normal rate and normal heart sounds.  An irregularly irregular rhythm present.  No murmur heard. Pulmonary/Chest: Effort normal and breath sounds normal.  Abdominal: Soft. Bowel sounds are normal.  Musculoskeletal:       Right shoulder: He exhibits decreased range of motion, tenderness and crepitus.       Left shoulder: He exhibits decreased range of motion, tenderness and crepitus.       Lumbar back: He exhibits decreased range of motion, tenderness (tender to palpation over the bilateral SI joints) and bony tenderness. He exhibits no spasm.    the right great toenail is slightly ingrown on the lateral nail margin.        Assessment & Plan:  1. Back pain Believe the patient has sacroiliitis. I will begin by obtaining a lumbar sacral x-ray. - DG Lumbar Spine Complete; Future  2. Pain in joint, shoulder region, unspecified laterality Fill the patient likely has significant arthritis in both shoulders. I will obtain a CMP to evaluate his renal function to determine if he can tolerate Celebrex.  Our check a CBC to make sure he is not anemic prior to starting NSAID.Marland Kitchen  he is at increased risk of a GI bleed due to his chronic aspirin use for atrial fibrillation - COMPLETE METABOLIC PANEL WITH GFR - CBC with Differential  3. Myalgia and myositis However the patient shoulder pain could also be due to PMR.  Therefore I will begin the patient on a prednisone taper pack. I will also obtain a sedimentation rate. I see the patient back in one week. If He has dramatic improvement on prednisone and I will entertain the diagnosis of PMR sedimentation rate is markedly elevated.  If not, I will treat the patient for isolated arthritis in the shoulders as well as sacroiliitis - COMPLETE METABOLIC PANEL WITH GFR - CBC with Differential - Sedimentation rate - predniSONE (DELTASONE) 20 MG tablet; 3 tabs poqday 1-2, 2 tabs poqday 3-4, 1 tab poqday 5-6  Dispense: 12 tablet; Refill: 0  4. Ingrown toenail without infection I will try to avoid having to remove his toenail. I recommended daily Epsom salts soaks and other conservative measures to try to allow the nail to grow out beyond the skin for. If we're unsuccessful we can always remove the nail at his followup in one week.  5. hypersomnolence. I asked the patient to discontinue hydroxyzine. We will recheck him in one week.

## 2013-02-15 ENCOUNTER — Ambulatory Visit (INDEPENDENT_AMBULATORY_CARE_PROVIDER_SITE_OTHER): Payer: Medicare Other | Admitting: Family Medicine

## 2013-02-15 ENCOUNTER — Encounter: Payer: Self-pay | Admitting: Family Medicine

## 2013-02-15 VITALS — BP 100/58 | HR 60 | Temp 97.8°F | Resp 16 | Wt 151.0 lb

## 2013-02-15 DIAGNOSIS — M549 Dorsalgia, unspecified: Secondary | ICD-10-CM

## 2013-02-15 DIAGNOSIS — M25519 Pain in unspecified shoulder: Secondary | ICD-10-CM

## 2013-02-15 MED ORDER — HYDROCODONE-ACETAMINOPHEN 5-325 MG PO TABS: 1.0000 | ORAL_TABLET | Freq: Four times a day (QID) | ORAL | Status: DC | PRN

## 2013-02-15 NOTE — Progress Notes (Signed)
Subjective:    Patient ID: [name], male    DOB: 02/05/1923, 79 y.o.   MRN: [id number]  HPI 02/07/13 Patient presents with multiple complaints today. #1 is his right great toenail, lateral nail margin is slightly ingrown.  #2, the patient complains of pain in both shoulders. It is worse with abduction. He reports crepitus. He reports pain with range of motion. The pain is unrelieved by Tylenol.  #3 is weakness and pain in his lower back and posterior hips. He is tender to palpation over the bilateral SI joints. He also complains of pain with range of motion in both hips. He complains of myalgias in his buttock muscles and in his thigh muscles.  #4 is excessive daytime drowsiness. On further questioning, the patient is taking hydroxyzine 10 mg every 8 hours as needed for "pain".  At that time, my plan was: 1. Back pain Believe the patient has sacroiliitis. I will begin by obtaining a lumbar sacral x-ray. - DG Lumbar Spine Complete; Future  2. Pain in joint, shoulder region, unspecified laterality Fill the patient likely has significant arthritis in both shoulders. I will obtain a CMP to evaluate his renal function to determine if he can tolerate Celebrex.  Our check a CBC to make sure he is not anemic prior to starting NSAID.Marland Kitchen  he is at increased risk of a GI bleed due to his chronic aspirin use for atrial fibrillation - COMPLETE METABOLIC PANEL WITH GFR - CBC with Differential  3. Myalgia and myositis However the patient shoulder pain could also be due to PMR.  Therefore I will begin the patient on a prednisone taper pack. I will also obtain a sedimentation rate. I see the patient back in one week. If He has dramatic improvement on prednisone and I will entertain the diagnosis of PMR sedimentation rate is markedly elevated.  If not, I will treat the patient for isolated arthritis in the shoulders as well as sacroiliitis - COMPLETE METABOLIC PANEL WITH GFR - CBC with Differential -  Sedimentation rate - predniSONE (DELTASONE) 20 MG tablet; 3 tabs poqday 1-2, 2 tabs poqday 3-4, 1 tab poqday 5-6  Dispense: 12 tablet; Refill: 0  4. Ingrown toenail without infection I will try to avoid having to remove his toenail. I recommended daily Epsom salts soaks and other conservative measures to try to allow the nail to grow out beyond the skin for. If we're unsuccessful we can always remove the nail at his followup in one week.  5. hypersomnolence. I asked the patient to discontinue hydroxyzine. We will recheck him in one week.  X-ray of his lumbar spine revealed multilevel moderate degenerative disc disease.  Labs are listed below and showed mild renal insufficiency but no anemia. His sedimentation rate was within normal limits. The prednisone did help his pain. 50%, but not to the level one would expect with PMR. No visits with results within 1 Week(s) from this visit. Latest known visit with results is:  Office Visit on 02/07/2013  Component Date Value Range Status  . Sodium 02/07/2013 139  135 - 145 mEq/L Final  . Potassium 02/07/2013 4.6  3.5 - 5.3 mEq/L Final  . Chloride 02/07/2013 101  96 - 112 mEq/L Final  . CO2 02/07/2013 29  19 - 32 mEq/L Final  . Glucose, Bld 02/07/2013 81  70 - 99 mg/dL Final  . BUN 21/30/8657 23  6 - 23 mg/dL Final  . Creat 84/69/6295 1.53* 0.50 - 1.35 mg/dL Final  .  Total Bilirubin 02/07/2013 0.5  0.3 - 1.2 mg/dL Final  . Alkaline Phosphatase 02/07/2013 51  39 - 117 U/L Final  . AST 02/07/2013 19  0 - 37 U/L Final  . ALT 02/07/2013 12  0 - 53 U/L Final  . Total Protein 02/07/2013 6.5  6.0 - 8.3 g/dL Final  . Albumin 09/81/1914 4.3  3.5 - 5.2 g/dL Final  . Calcium 78/29/5621 9.2  8.4 - 10.5 mg/dL Final  . GFR, Est African American 02/07/2013 46*  Final  . GFR, Est Non African American 02/07/2013 40*  Final   Comment:                            The estimated GFR is a calculation valid for adults (>=47 years old)                          that uses  the CKD-EPI algorithm to adjust for age and sex. It is                            not to be used for children, pregnant women, hospitalized patients,                             patients on dialysis, or with rapidly changing kidney function.                          According to the NKDEP, eGFR >89 is normal, 60-89 shows mild                          impairment, 30-59 shows moderate impairment, 15-29 shows severe                          impairment and <15 is ESRD.                             . WBC 02/07/2013 5.0  4.0 - 10.5 K/uL Final  . RBC 02/07/2013 3.92* 4.22 - 5.81 MIL/uL Final  . Hemoglobin 02/07/2013 12.4* 13.0 - 17.0 g/dL Final  . HCT 30/86/5784 37.6* 39.0 - 52.0 % Final  . MCV 02/07/2013 95.9  78.0 - 100.0 fL Final  . MCH 02/07/2013 31.6  26.0 - 34.0 pg Final  . MCHC 02/07/2013 33.0  30.0 - 36.0 g/dL Final  . RDW 69/62/9528 13.3  11.5 - 15.5 % Final  . Platelets 02/07/2013 143* 150 - 400 K/uL Final  . Neutrophils Relative % 02/07/2013 61  43 - 77 % Final  . Neutro Abs 02/07/2013 3.0  1.7 - 7.7 K/uL Final  . Lymphocytes Relative 02/07/2013 27  12 - 46 % Final  . Lymphs Abs 02/07/2013 1.3  0.7 - 4.0 K/uL Final  . Monocytes Relative 02/07/2013 8  3 - 12 % Final  . Monocytes Absolute 02/07/2013 0.4  0.1 - 1.0 K/uL Final  . Eosinophils Relative 02/07/2013 4  0 - 5 % Final  . Eosinophils Absolute 02/07/2013 0.2  0.0 - 0.7 K/uL Final  . Basophils Relative 02/07/2013 0  0 - 1 % Final  . Basophils Absolute 02/07/2013 0.0  0.0 - 0.1 K/uL Final  . Smear Review  02/07/2013 Criteria for review not met   Final  . Sed Rate 02/07/2013 4  0 - 16 mm/hr Final    Past Medical History  Diagnosis Date  . Hyperlipidemia   . Atrial fibrillation     permanent afib  . Long-term (current) use of anticoagulants     previously on coumadin, stopped by Dr Deborah Chalk due to frequent falls  . BRADYCARDIA-TACHYCARDIA SYNDROME 2007    s/p PPM by Dr Deborah Chalk   Past Surgical History  Procedure Laterality  Date  . Abdominal surgery  1986  . Hernia repair    . Pacemaker insertion  01/28/06    PPM  (MDT) implanted by Dr Deborah Chalk 2007   Current Outpatient Prescriptions on File Prior to Visit  Medication Sig Dispense Refill  . acetaminophen (TYLENOL) 325 MG tablet Take 325 mg by mouth every 6 (six) hours as needed.      Marland Kitchen aspirin 325 MG tablet Take 325 mg by mouth daily.      . digoxin (LANOXIN) 0.25 MG tablet Take 1 tablet (250 mcg total) by mouth daily.  30 tablet  3  . fish oil-omega-3 fatty acids 1000 MG capsule Take 1 g by mouth daily.      Marland Kitchen HYDROcodone-acetaminophen (VICODIN) 5-500 MG per tablet Take 1 tablet by mouth every 6 (six) hours as needed for pain.  30 tablet  0  . hydrOXYzine (ATARAX/VISTARIL) 10 MG tablet Take 10 mg by mouth every 8 (eight) hours as needed.       Marland Kitchen ketotifen (ZADITOR) 0.025 % ophthalmic solution Place 1 drop into both eyes as needed.      . loteprednol (LOTEMAX) 0.5 % ophthalmic suspension Place 1 drop into both eyes 4 (four) times daily.      . metoprolol succinate (TOPROL-XL) 25 MG 24 hr tablet Take 1 tablet (25 mg total) by mouth daily. Take with or immediately following a meal.  90 tablet  3  . polyethylene glycol (MIRALAX / GLYCOLAX) packet Take 17 g by mouth daily.      . simvastatin (ZOCOR) 40 MG tablet Take 1 tablet (40 mg total) by mouth at bedtime.  30 tablet  5  . Tamsulosin HCl (FLOMAX) 0.4 MG CAPS Take 0.4 mg by mouth daily.        No current facility-administered medications on file prior to visit.   No Known Allergies History   Social History  . Marital Status: Single    Spouse Name: N/A    Number of Children: N/A  . Years of Education: N/A   Occupational History  . Not on file.   Social History Main Topics  . Smoking status: Former Smoker -- 1.00 packs/day for 25 years    Types: Cigarettes    Quit date: 08/02/1961  . Smokeless tobacco: Never Used  . Alcohol Use: No  . Drug Use: No  . Sexually Active: Not on file   Other Topics  Concern  . Not on file   Social History Narrative  . No narrative on file   Family History  Problem Relation Age of Onset  . Heart attack Father       Review of Systems  All other systems reviewed and are negative.       Objective:   Physical Exam  Vitals reviewed. Constitutional: He appears well-developed and well-nourished.  Cardiovascular: Normal rate and normal heart sounds.  An irregularly irregular rhythm present.  No murmur heard. Pulmonary/Chest: Effort normal and breath sounds normal.  Abdominal: Soft.  Bowel sounds are normal.  Musculoskeletal:       Right shoulder: He exhibits decreased range of motion, tenderness and crepitus.       Left shoulder: He exhibits decreased range of motion, tenderness and crepitus.       Lumbar back: He exhibits decreased range of motion, tenderness (tender to palpation over the bilateral SI joints) and bony tenderness. He exhibits no spasm.   the right great toenail is slightly ingrown on the lateral nail margin.        Assessment & Plan:  1. Back pain  2. Pain in joint, shoulder region, unspecified laterality At this point we discussed the patient's options. He has no sign of PMR. He cannot continue prednisone. Tylenol is not eating his pain. His tracing between NSAIDs and monitoring his renal insufficiency closely with taking a very low dose narcotics for his pain. The patient elects to try a very low dose narcotic. He was warned about hypersomnolence, dizziness, confusion, and falls.  I prescribed Norco 5/325 one half to one tablet q. 8 hours when necessary pain. Advised him to use this sparingly.

## 2013-03-07 ENCOUNTER — Other Ambulatory Visit: Payer: Self-pay

## 2013-03-14 ENCOUNTER — Telehealth: Payer: Self-pay | Admitting: Family Medicine

## 2013-03-14 NOTE — Telephone Encounter (Signed)
Tamsulosin HCL 0.4 mg cap 1 QHS #30

## 2013-03-15 MED ORDER — TAMSULOSIN HCL 0.4 MG PO CAPS: 0.4000 mg | ORAL_CAPSULE | Freq: Every day | ORAL | Status: DC

## 2013-03-15 NOTE — Telephone Encounter (Signed)
Medication refilled per protocol. 

## 2013-03-26 ENCOUNTER — Telehealth: Payer: Self-pay | Admitting: Family Medicine

## 2013-03-26 MED ORDER — SIMVASTATIN 40 MG PO TABS: 40.0000 mg | ORAL_TABLET | Freq: Every day | ORAL | Status: DC

## 2013-03-26 NOTE — Telephone Encounter (Signed)
Rx Refilled  

## 2013-03-26 NOTE — Telephone Encounter (Signed)
Simvastatin 40 mg tab 1 QHS #90 °

## 2013-03-28 ENCOUNTER — Telehealth: Payer: Self-pay | Admitting: Family Medicine

## 2013-03-28 MED ORDER — SIMVASTATIN 40 MG PO TABS: 40.0000 mg | ORAL_TABLET | Freq: Every day | ORAL | Status: DC

## 2013-03-28 NOTE — Telephone Encounter (Signed)
Rx Refilled  

## 2013-03-28 NOTE — Telephone Encounter (Signed)
Simvastatin 40 mg tab 1 QHS #90

## 2013-04-10 ENCOUNTER — Other Ambulatory Visit: Payer: Self-pay | Admitting: Family Medicine

## 2013-04-10 NOTE — Telephone Encounter (Signed)
ok 

## 2013-04-10 NOTE — Telephone Encounter (Signed)
?   OK to Refill  

## 2013-05-07 ENCOUNTER — Ambulatory Visit (INDEPENDENT_AMBULATORY_CARE_PROVIDER_SITE_OTHER): Payer: Medicare Other | Admitting: *Deleted

## 2013-05-07 DIAGNOSIS — Z950 Presence of cardiac pacemaker: Secondary | ICD-10-CM

## 2013-05-07 DIAGNOSIS — I495 Sick sinus syndrome: Secondary | ICD-10-CM

## 2013-05-11 LAB — REMOTE PACEMAKER DEVICE
BRDY-0004RV: 110 {beats}/min
RV LEAD IMPEDENCE PM: 513 Ohm
RV LEAD THRESHOLD: 0.625 V

## 2013-05-28 ENCOUNTER — Encounter: Payer: Self-pay | Admitting: Internal Medicine

## 2013-05-31 ENCOUNTER — Encounter: Payer: Self-pay | Admitting: Internal Medicine

## 2013-06-07 ENCOUNTER — Other Ambulatory Visit: Payer: Self-pay

## 2013-07-13 ENCOUNTER — Other Ambulatory Visit: Payer: Self-pay | Admitting: Internal Medicine

## 2013-07-13 MED ORDER — METOPROLOL SUCCINATE ER 25 MG PO TB24: 25.0000 mg | ORAL_TABLET | Freq: Every day | ORAL | Status: DC

## 2013-07-18 ENCOUNTER — Ambulatory Visit (INDEPENDENT_AMBULATORY_CARE_PROVIDER_SITE_OTHER): Payer: Medicare Other | Admitting: Internal Medicine

## 2013-07-18 ENCOUNTER — Encounter: Payer: Self-pay | Admitting: Internal Medicine

## 2013-07-18 ENCOUNTER — Encounter: Payer: Self-pay | Admitting: *Deleted

## 2013-07-18 VITALS — BP 138/61 | HR 76 | Ht 69.0 in | Wt 149.0 lb

## 2013-07-18 DIAGNOSIS — I495 Sick sinus syndrome: Secondary | ICD-10-CM

## 2013-07-18 DIAGNOSIS — I4891 Unspecified atrial fibrillation: Secondary | ICD-10-CM

## 2013-07-18 DIAGNOSIS — Z950 Presence of cardiac pacemaker: Secondary | ICD-10-CM

## 2013-07-18 LAB — MDC_IDC_ENUM_SESS_TYPE_INCLINIC
Lead Channel Impedance Value: 0 Ohm
Lead Channel Impedance Value: 468 Ohm
Lead Channel Setting Pacing Pulse Width: 0.4 ms
Lead Channel Setting Sensing Sensitivity: 2.8 mV

## 2013-07-18 NOTE — Patient Instructions (Addendum)
Generator change on 08/613  See instruction sheet

## 2013-07-19 LAB — CBC WITH DIFFERENTIAL/PLATELET
Basophils Relative: 0.7 % (ref 0.0–3.0)
Eosinophils Relative: 5.1 % — ABNORMAL HIGH (ref 0.0–5.0)
HCT: 35.6 % — ABNORMAL LOW (ref 39.0–52.0)
Lymphs Abs: 1.2 10*3/uL (ref 0.7–4.0)
MCHC: 33.2 g/dL (ref 30.0–36.0)
MCV: 97.9 fl (ref 78.0–100.0)
Monocytes Absolute: 0.2 10*3/uL (ref 0.1–1.0)
Platelets: 94 10*3/uL — ABNORMAL LOW (ref 150.0–400.0)
RBC: 3.64 Mil/uL — ABNORMAL LOW (ref 4.22–5.81)
WBC: 4.6 10*3/uL (ref 4.5–10.5)

## 2013-07-19 LAB — BASIC METABOLIC PANEL
BUN: 19 mg/dL (ref 6–23)
CO2: 29 mEq/L (ref 19–32)
Chloride: 106 mEq/L (ref 96–112)
Potassium: 4.4 mEq/L (ref 3.5–5.1)

## 2013-07-20 ENCOUNTER — Other Ambulatory Visit: Payer: Self-pay | Admitting: *Deleted

## 2013-07-20 DIAGNOSIS — Z45018 Encounter for adjustment and management of other part of cardiac pacemaker: Secondary | ICD-10-CM

## 2013-07-27 NOTE — Progress Notes (Signed)
PCP: Tom Pickard, MD  [name] is a 79 y.o. male who presents today for routine electrophysiology followup.  Since last being seen in our clinic, the patient reports doing very well.  He remains active for his age. He does have some fatigue. Today, he denies symptoms of palpitations, chest pain, shortness of breath,  lower extremity edema, dizziness, presyncope, or syncope.  The patient is otherwise without complaint today.   Past Medical History  Diagnosis Date  . Hyperlipidemia   . Atrial fibrillation     permanent afib  . Long-term (current) use of anticoagulants     previously on coumadin, stopped by Dr Tennant due to frequent falls  . BRADYCARDIA-TACHYCARDIA SYNDROME 2007    s/p PPM by Dr Tennant   Past Surgical History  Procedure Laterality Date  . Abdominal surgery  1986  . Hernia repair    . Pacemaker insertion  01/28/06    PPM  (MDT) implanted by Dr Tennant 2007    Current Outpatient Prescriptions  Medication Sig Dispense Refill  . acetaminophen (TYLENOL) 325 MG tablet Take 325 mg by mouth every 6 (six) hours as needed.      . aspirin 325 MG tablet Take 325 mg by mouth daily.      . digoxin (LANOXIN) 0.25 MG tablet TAKE 1 TABLET (250 MCG TOTAL) BY MOUTH DAILY.  30 tablet  3  . fish oil-omega-3 fatty acids 1000 MG capsule Take 1 g by mouth daily.      . loteprednol (LOTEMAX) 0.5 % ophthalmic suspension Place 1 drop into both eyes 4 (four) times daily.      . metoprolol succinate (TOPROL-XL) 25 MG 24 hr tablet Take 1 tablet (25 mg total) by mouth daily. Take with or immediately following a meal.  90 tablet  0  . polyethylene glycol (MIRALAX / GLYCOLAX) packet Take 17 g by mouth daily.      . simvastatin (ZOCOR) 40 MG tablet Take 1 tablet (40 mg total) by mouth at bedtime.  90 tablet  1  . tamsulosin (FLOMAX) 0.4 MG CAPS capsule Take 1 capsule (0.4 mg total) by mouth daily.  30 capsule  11   No current facility-administered medications for this visit.    Physical  Exam: Filed Vitals:   07/18/13 1518  BP: 138/61  Pulse: 76  Height: 5' 9" (1.753 m)  Weight: 149 lb (67.586 kg)    GEN- The patient is well appearing, alert and oriented x 3 today.   Head- normocephalic, atraumatic Eyes-  Sclera clear, conjunctiva pink Ears- hearing intact Oropharynx- clear Lungs- Clear to ausculation bilaterally, normal work of breathing Chest- pacemaker pocket is well healed Heart- irregular rate and rhythm,   GI- soft, NT, ND, + BS Extremities- no clubbing, cyanosis, or edema  Pacemaker interrogation- reviewed in detail today,  See PACEART report  Assessment and Plan:  ATRIAL FIBRILLATION  Permanent afib  Not previously felt to be a candidate for anticoagulation due to frequent falls by Dr Tennant.  Unfortunately, he continues to fall often.  He will therefore continue ASA for now.  Consider eliquis (AVERROES data) if his falls improve.   BRADYCARDIA-TACHYCARDIA SYNDROME  His pacemaker has reached ERI.  Risks, benefits, and alternatives to PPM generator change were discussed at length with the patient and his spouse who wish to proceed.  We will schedule generator change at the next available time.  See Pace Art report  As above  

## 2013-07-29 ENCOUNTER — Other Ambulatory Visit: Payer: Self-pay | Admitting: Family Medicine

## 2013-08-03 ENCOUNTER — Encounter (HOSPITAL_COMMUNITY): Payer: Self-pay | Admitting: Pharmacy Technician

## 2013-08-06 MED ORDER — CEFAZOLIN SODIUM-DEXTROSE 2-3 GM-% IV SOLR
2.0000 g | INTRAVENOUS | Status: DC
  Filled 2013-08-06: qty 50

## 2013-08-06 MED ORDER — SODIUM CHLORIDE 0.9 % IR SOLN
80.0000 mg | Status: DC
  Filled 2013-08-06: qty 2

## 2013-08-07 ENCOUNTER — Encounter (HOSPITAL_COMMUNITY)
Admission: RE | Disposition: A | Discharge status: Home / Self Care | Payer: Medicare Other | Source: Ambulatory Visit | Attending: Internal Medicine

## 2013-08-07 ENCOUNTER — Ambulatory Visit (HOSPITAL_COMMUNITY)
Admission: RE | Admit: 2013-08-07 | Discharge: 2013-08-07 | Disposition: A | Discharge status: Home / Self Care | Payer: Medicare Other | Source: Ambulatory Visit | Attending: Internal Medicine | Admitting: Internal Medicine

## 2013-08-07 DIAGNOSIS — I495 Sick sinus syndrome: Secondary | ICD-10-CM | POA: Insufficient documentation

## 2013-08-07 DIAGNOSIS — Z7982 Long term (current) use of aspirin: Secondary | ICD-10-CM | POA: Insufficient documentation

## 2013-08-07 DIAGNOSIS — Z45018 Encounter for adjustment and management of other part of cardiac pacemaker: Secondary | ICD-10-CM

## 2013-08-07 DIAGNOSIS — E785 Hyperlipidemia, unspecified: Secondary | ICD-10-CM | POA: Insufficient documentation

## 2013-08-07 DIAGNOSIS — W19XXXA Unspecified fall, initial encounter: Secondary | ICD-10-CM | POA: Insufficient documentation

## 2013-08-07 DIAGNOSIS — M25519 Pain in unspecified shoulder: Secondary | ICD-10-CM | POA: Insufficient documentation

## 2013-08-07 DIAGNOSIS — Z9181 History of falling: Secondary | ICD-10-CM | POA: Insufficient documentation

## 2013-08-07 DIAGNOSIS — I4891 Unspecified atrial fibrillation: Secondary | ICD-10-CM | POA: Insufficient documentation

## 2013-08-07 HISTORY — PX: PERMANENT PACEMAKER GENERATOR CHANGE: SHX6022

## 2013-08-07 LAB — CBC
HCT: 37.8 % — ABNORMAL LOW (ref 39.0–52.0)
HEMOGLOBIN: 12.5 g/dL — AB (ref 13.0–17.0)
MCH: 32.5 pg (ref 26.0–34.0)
MCHC: 33.1 g/dL (ref 30.0–36.0)
MCV: 98.2 fL (ref 78.0–100.0)
Platelets: 126 10*3/uL — ABNORMAL LOW (ref 150–400)
RBC: 3.85 MIL/uL — AB (ref 4.22–5.81)
RDW: 12.5 % (ref 11.5–15.5)
WBC: 4.4 10*3/uL (ref 4.0–10.5)

## 2013-08-07 LAB — BASIC METABOLIC PANEL
BUN: 18 mg/dL (ref 6–23)
CO2: 29 meq/L (ref 19–32)
Calcium: 8.9 mg/dL (ref 8.4–10.5)
Chloride: 101 mEq/L (ref 96–112)
Creatinine, Ser: 1.44 mg/dL — ABNORMAL HIGH (ref 0.50–1.35)
GFR calc Af Amer: 48 mL/min — ABNORMAL LOW (ref >90)
GFR calc non Af Amer: 42 mL/min — ABNORMAL LOW (ref >90)
Glucose, Bld: 85 mg/dL (ref 70–99)
POTASSIUM: 4.1 meq/L (ref 3.7–5.3)
Sodium: 141 mEq/L (ref 137–147)

## 2013-08-07 LAB — SURGICAL PCR SCREEN
MRSA, PCR: NEGATIVE
Staphylococcus aureus: NEGATIVE

## 2013-08-07 SURGERY — PERMANENT PACEMAKER GENERATOR CHANGE
Anesthesia: LOCAL

## 2013-08-07 MED ORDER — FENTANYL CITRATE 0.05 MG/ML IJ SOLN
INTRAMUSCULAR | Status: AC
  Filled 2013-08-07: qty 2

## 2013-08-07 MED ORDER — SODIUM CHLORIDE 0.9 % IJ SOLN: 3.0000 mL | INTRAMUSCULAR | Status: DC | PRN

## 2013-08-07 MED ORDER — SODIUM CHLORIDE 0.9 % IJ SOLN: 3.0000 mL | Freq: Two times a day (BID) | INTRAMUSCULAR | Status: DC

## 2013-08-07 MED ORDER — MUPIROCIN 2 % EX OINT: TOPICAL_OINTMENT | Freq: Two times a day (BID) | CUTANEOUS | Status: DC

## 2013-08-07 MED ORDER — MUPIROCIN 2 % EX OINT
TOPICAL_OINTMENT | CUTANEOUS | Status: AC
  Filled 2013-08-07: qty 22

## 2013-08-07 MED ORDER — SODIUM CHLORIDE 0.9 % IV SOLN
INTRAVENOUS | Status: DC
  Administered 2013-08-07: 50 mL/h via INTRAVENOUS

## 2013-08-07 MED ORDER — CHLORHEXIDINE GLUCONATE 4 % EX LIQD: 60.0000 mL | Freq: Once | CUTANEOUS | Status: DC

## 2013-08-07 MED ORDER — ONDANSETRON HCL 4 MG/2ML IJ SOLN: 4.0000 mg | Freq: Four times a day (QID) | INTRAMUSCULAR | Status: DC | PRN

## 2013-08-07 MED ORDER — LIDOCAINE HCL (PF) 1 % IJ SOLN
INTRAMUSCULAR | Status: AC
  Filled 2013-08-07: qty 30

## 2013-08-07 MED ORDER — HYDROCODONE-ACETAMINOPHEN 5-325 MG PO TABS: 1.0000 | ORAL_TABLET | ORAL | Status: DC | PRN

## 2013-08-07 MED ORDER — ACETAMINOPHEN 325 MG PO TABS
325.0000 mg | ORAL_TABLET | ORAL | Status: DC | PRN
  Filled 2013-08-07: qty 2

## 2013-08-07 MED ORDER — MIDAZOLAM HCL 5 MG/5ML IJ SOLN
INTRAMUSCULAR | Status: AC
  Filled 2013-08-07: qty 5

## 2013-08-07 MED ORDER — SODIUM CHLORIDE 0.9 % IV SOLN: 250.0000 mL | INTRAVENOUS | Status: DC | PRN

## 2013-08-07 NOTE — Discharge Instructions (Signed)
Pacemaker Battery Change A pacemaker battery usually lasts 4 to 12 years. Once or twice per year, you will be asked to visit your health care provider to have a full evaluation of your pacemaker. When a battery needs to be replaced, the entire pacemaker is replaced so that you can benefit from new circuitry and any new features that have been added to pacemakers. Most often, this procedure is very simple because the leads are already in place.  There are many things that affect how long a pacemaker battery will last, including:   The age of the pacemaker.   The number of leads (1, 2, or 3).   The pacemaker work load. If the pacemaker is helping the heart more often, the battery will not last as long as it would if the pacemaker did not need to help the heart.   Power (voltage) settings. LET [facility] CARE PROVIDER KNOW ABOUT:   Any allergies you have.   All medicines you are taking, including vitamins, herbs, eye drops, creams, and over-the-counter medicines.   Previous problems you or members of your family have had with the use of anesthetics.   Any blood disorders you have.   Previous surgeries you have had, especially since your last pacemaker placement.   Medical conditions you have.   Possible pregnancy, if applicable.  Symptoms of chest pain, trouble breathing, palpitations, lightheadedness, or feelings of an abnormal or irregular heartbeat. RISKS AND COMPLICATIONS  Generally, this is a safe procedure. However, as with any procedure, complications can occur. Possible complications include:   Bleeding.   Bruising of the skin around where the incision was made.   Pain at the incision site.   Pulling apart of the skin at the incision site.   Infection.   Allergic reaction to anesthetics or other medicines used during the procedure.  Diabetics may have a temporary increase in their blood sugar after any surgical procedure.  BEFORE THE PROCEDURE   Wash  all of the skin around the area of the chest where the pacemaker is located.   Ask your health care provider for help with any medicine adjustments before the pacemaker is replaced.   Unless advised otherwise, do not eat or drink after midnight on the night before the procedure. You may drink water to take your medicine as you normally would or as directed. PROCEDURE   After giving medicine to numb the skin, your health care provider will make a cut to reopen the pocket holding the pacemaker.   The old pacemaker will be disconnected from its leads.   The leads will be tested.   If needed, the leads will be replaced. If the leads are functioning properly, the new pacemaker may be connected to the existing leads.  A heart monitor and the pacemaker programmer will be used to make sure that the new pacemaker is working properly.  The incision site is then closed. A dressing is placed over the pacemaker site. The dressing is removed 24 to 48 hours afterwards. AFTER THE PROCEDURE   You will be taken to a recovery area after the new pacemaker implant is completed. Your vital signs such as blood pressure, heart rate, breathing, and oxygen levels will be monitored.  Your health care provider will tell you when you will need to next test your pacemaker or when to return to the office for follow-up for removal of stitches. Document Released: 10/27/2006 Document Revised: 03/21/2013 Document Reviewed: 01/31/2013 [facility] Patient Information 2014 [place].

## 2013-08-07 NOTE — Progress Notes (Signed)
Pt with minor mechanical fall while in the bathroom.  Did not hit his head.  He reported mild R shoulder pain which appears to be now resolved.  OK to discharge.  He will contact my office if he develops any new complaints related to his fall.

## 2013-08-07 NOTE — Op Note (Signed)
SURGEON:  [name], MD     PREPROCEDURE DIAGNOSES:   1. Symptomatic bradycardia/ tachycardia/ bradycardia syndrome.   2. Permanent Atrial fibrillation.     POSTPROCEDURE DIAGNOSES:   1. Symptomatic bradycardia/ tachycardia/ bradycardia syndrome.   2. Permanent Atrial fibrillation.     PROCEDURES:   1. Pacemaker pulse generator replacement.   2. Skin pocket revision.     INTRODUCTION:  [name] is a 80 y.o. male with a history of tachycardia bradycardia syndrome. He has done well since his pacemaker was implanted.  He has recently reached ERI battery status.  He presents today for pacemaker pulse generator replacement.       DESCRIPTION OF THE PROCEDURE:  Informed written consent was obtained, and the patient was brought to the electrophysiology lab in the fasting state.  The patient's pacemaker was interrogated today and found to be at elective replacement indicator battery status.  The patient was adequately sedated with intravenous Versed and Fentanyl as outlined in the nursing report.  The patient's right chest was prepped and draped in the usual sterile fashion by the EP lab staff.  The skin overlying the existing pacemaker was infiltrated with lidocaine for local analgesia.  A 4-cm incision was made over the pacemaker pocket.  Using a combination of sharp and blunt dissection, the pacemaker was exposed and removed from the body.  The device was disconnected from the leads. A single silk suture was identified and removed which had secured the device to the pectoralis fascia.  There was no foreign matter or debris within the pocket.   The right ventricular lead was confirmed to be a Guidant model 4470 (SN [id number]) lead implanted on the 01/28/2006.  This lead was examined and its integrity was confirmed to be intact.   Right ventricular lead R-waves measured 8.4 mV with impedance of 415 ohms and a threshold of 0.6 V at 0.5 msec.  The lead was connected to a Lostant  (serial number [id number]) pacemaker.  The pocket was revised to accommodate this new device.  Electrocautery was required to assure hemostasis.  The pocket was irrigated with copious gentamicin solution. The pacemaker was then placed into the pocket.  The pocket was then closed in 2 layers with 2-0 Vicryl suture over the subcutaneous and subcuticular layers.  Steri-Strips and a sterile dressing were then applied.  There were no early apparent complications.     CONCLUSIONS:   1. Successful pacemaker pulse generator replacement for elective replacement indicator battery status and tachycardia/ bradycardia syndrome  2. No early apparent complications.     [name], MD 08/07/2013 12:34 PM

## 2013-08-07 NOTE — H&P (View-Only) (Signed)
PCP: Margaretmary Eddy, MD  [name] is a 80 y.o. male who presents today for routine electrophysiology followup.  Since last being seen in our clinic, the patient reports doing very well.  He remains active for his age. He does have some fatigue. Today, he denies symptoms of palpitations, chest pain, shortness of breath,  lower extremity edema, dizziness, presyncope, or syncope.  The patient is otherwise without complaint today.   Past Medical History  Diagnosis Date  . Hyperlipidemia   . Atrial fibrillation     permanent afib  . Long-term (current) use of anticoagulants     previously on coumadin, stopped by Dr [name] due to frequent falls  . BRADYCARDIA-TACHYCARDIA SYNDROME 2007    s/p PPM by Dr [name]   Past Surgical History  Procedure Laterality Date  . Abdominal surgery  1986  . Hernia repair    . Pacemaker insertion  01/28/06    PPM  (MDT) implanted by Dr [name] 2007    Current Outpatient Prescriptions  Medication Sig Dispense Refill  . acetaminophen (TYLENOL) 325 MG tablet Take 325 mg by mouth every 6 (six) hours as needed.      Marland Kitchen aspirin 325 MG tablet Take 325 mg by mouth daily.      . digoxin (LANOXIN) 0.25 MG tablet TAKE 1 TABLET (250 MCG TOTAL) BY MOUTH DAILY.  30 tablet  3  . fish oil-omega-3 fatty acids 1000 MG capsule Take 1 g by mouth daily.      Marland Kitchen loteprednol (LOTEMAX) 0.5 % ophthalmic suspension Place 1 drop into both eyes 4 (four) times daily.      . metoprolol succinate (TOPROL-XL) 25 MG 24 hr tablet Take 1 tablet (25 mg total) by mouth daily. Take with or immediately following a meal.  90 tablet  0  . polyethylene glycol (MIRALAX / GLYCOLAX) packet Take 17 g by mouth daily.      . simvastatin (ZOCOR) 40 MG tablet Take 1 tablet (40 mg total) by mouth at bedtime.  90 tablet  1  . tamsulosin (FLOMAX) 0.4 MG CAPS capsule Take 1 capsule (0.4 mg total) by mouth daily.  30 capsule  11   No current facility-administered medications for this visit.    Physical  Exam: Filed Vitals:   07/18/13 1518  BP: 138/61  Pulse: 76  Height: 5\' 9"  (1.753 m)  Weight: 149 lb (67.586 kg)    GEN- The patient is well appearing, alert and oriented x 3 today.   Head- normocephalic, atraumatic Eyes-  Sclera clear, conjunctiva pink Ears- hearing intact Oropharynx- clear Lungs- Clear to ausculation bilaterally, normal work of breathing Chest- pacemaker pocket is well healed Heart- irregular rate and rhythm,   GI- soft, NT, ND, + BS Extremities- no clubbing, cyanosis, or edema  Pacemaker interrogation- reviewed in detail today,  See PACEART report  Assessment and Plan:  ATRIAL FIBRILLATION  Permanent afib  Not previously felt to be a candidate for anticoagulation due to frequent falls by Dr [name].  Unfortunately, he continues to fall often.  He will therefore continue ASA for now.  Consider eliquis (AVERROES data) if his falls improve.   BRADYCARDIA-TACHYCARDIA SYNDROME  His pacemaker has reached ERI.  Risks, benefits, and alternatives to PPM generator change were discussed at length with the patient and his spouse who wish to proceed.  We will schedule generator change at the next available time.  See Claudia Desanctis Art report  As above

## 2013-08-07 NOTE — Interval H&P Note (Signed)
History and Physical Interval Note:  08/07/2013 11:17 AM  [name]  has presented today for surgery, with the diagnosis of ERI  The various methods of treatment have been discussed with the patient and family. After consideration of risks, benefits and other options for treatment, the patient has consented to  Procedure(s): PERMANENT PACEMAKER GENERATOR CHANGE (N/A) as a surgical intervention .  The patient's history has been reviewed, patient examined, no change in status, stable for surgery.  I have reviewed the patient's chart and labs.  Questions were answered to the patient's satisfaction.     [name]

## 2013-08-07 NOTE — Progress Notes (Signed)
Dr. Rayann Heman at bedside to evaluate pt prior to d/c. Dr. Rayann Heman states that it is okay to discharge pt.

## 2013-08-08 ENCOUNTER — Encounter (HOSPITAL_COMMUNITY): Payer: Self-pay | Admitting: *Deleted

## 2013-08-16 ENCOUNTER — Ambulatory Visit: Payer: Medicare Other

## 2013-08-17 ENCOUNTER — Ambulatory Visit (INDEPENDENT_AMBULATORY_CARE_PROVIDER_SITE_OTHER): Payer: Medicare Other | Admitting: *Deleted

## 2013-08-17 DIAGNOSIS — I4891 Unspecified atrial fibrillation: Secondary | ICD-10-CM

## 2013-08-17 LAB — MDC_IDC_ENUM_SESS_TYPE_INCLINIC
Battery Remaining Longevity: 10
Date Time Interrogation Session: 20150116164816
Lead Channel Impedance Value: 478 Ohm
Lead Channel Pacing Threshold Amplitude: 0.5 V
Lead Channel Sensing Intrinsic Amplitude: 8 mV
Lead Channel Setting Pacing Amplitude: 2.5 V
MDC IDC MSMT BATTERY IMPEDANCE: 100 Ohm
MDC IDC MSMT BATTERY VOLTAGE: 2.79 V
MDC IDC MSMT LEADCHNL RA IMPEDANCE VALUE: 0 Ohm
MDC IDC MSMT LEADCHNL RV PACING THRESHOLD PULSEWIDTH: 0.4 ms
MDC IDC SET LEADCHNL RV PACING PULSEWIDTH: 0.4 ms
MDC IDC SET LEADCHNL RV SENSING SENSITIVITY: 2.8 mV
MDC IDC STAT BRADY RV PERCENT PACED: 87 %

## 2013-08-17 NOTE — Progress Notes (Signed)
Wound check appointment. Steri-strips removed. Wound without redness or edema. Incision edges approximated, wound well healed. Normal device function. Threshold, sensing, and impedance consistent with implant measurements. Device programmed at appropriate safety margins. Histogram distribution appropriate for patient and level of activity. Permanent AF + ASA (fall risk). No high ventricular rates noted. Patient educated about wound care, arm mobility, lifting restrictions. ROV in 3 months with JA.

## 2013-08-20 ENCOUNTER — Ambulatory Visit: Payer: Medicare Other

## 2013-08-29 ENCOUNTER — Encounter: Payer: Self-pay | Admitting: Internal Medicine

## 2013-09-03 ENCOUNTER — Encounter: Payer: Self-pay | Admitting: Physician Assistant

## 2013-09-03 ENCOUNTER — Ambulatory Visit (INDEPENDENT_AMBULATORY_CARE_PROVIDER_SITE_OTHER): Payer: Medicare Other | Admitting: Physician Assistant

## 2013-09-03 VITALS — BP 120/64 | HR 76 | Temp 97.9°F | Resp 18 | Ht 69.0 in | Wt 151.0 lb

## 2013-09-03 DIAGNOSIS — R449 Unspecified symptoms and signs involving general sensations and perceptions: Secondary | ICD-10-CM

## 2013-09-03 DIAGNOSIS — Z950 Presence of cardiac pacemaker: Secondary | ICD-10-CM

## 2013-09-03 DIAGNOSIS — R208 Other disturbances of skin sensation: Principal | ICD-10-CM

## 2013-09-03 DIAGNOSIS — I495 Sick sinus syndrome: Secondary | ICD-10-CM

## 2013-09-03 DIAGNOSIS — I4891 Unspecified atrial fibrillation: Secondary | ICD-10-CM

## 2013-09-03 DIAGNOSIS — R209 Unspecified disturbances of skin sensation: Secondary | ICD-10-CM

## 2013-09-03 NOTE — Progress Notes (Addendum)
Patient ID: [name] MRN: [id number], DOB: Jun 23, 1923, 80 y.o. Date of Encounter: @DATE @  Chief Complaint:  Chief Complaint  Patient presents with  . left foot numbness    since thursday,  also c/o pin/needles in face    HPI: 80 y.o. year old white male  presents with his daughter.  They report that this past Thursday night, which was 08/30/13, he was sleeping but woke up noticing that his "left foot felt numb on top". At that time also felt a pins and needles Piccoli sensation on his face. He thinks it was on both sides of his face. Says that he could touch and pinch his face and had decreased sensation. Says that the sensation in his base is better and pretty much back to normal now. However, the top of his left foot is still "numb" and with no sensation to touch. Says this is new for him and that the foot was not like this one week ago. Says that prior to the above episode he did have some decreased sensation on the bottom of the left foot but the sensation on the top of the left foot had been normal and intact.  He has noticed no other  new neurologic changes or deficits.  Has noticed no new weakness in either leg or arm. No slurred speech. No vision changes.   Past Medical History  Diagnosis Date  . Hyperlipidemia   . Atrial fibrillation     permanent afib  . Long-term (current) use of anticoagulants     previously on coumadin, stopped by Dr [name] due to frequent falls  . BRADYCARDIA-TACHYCARDIA SYNDROME 2007    s/p PPM by Dr [name]     Home Meds: See attached medication section for current medication list. Any medications entered into computer today will not appear on this note's list. The medications listed below were entered prior to today. Current Outpatient Prescriptions on File Prior to Visit  Medication Sig Dispense Refill  . acetaminophen (TYLENOL) 325 MG tablet Take 325 mg by mouth every 6 (six) hours as needed.      Marland Kitchen aspirin EC 81 MG tablet Take 81  mg by mouth daily.      . digoxin (LANOXIN) 0.25 MG tablet Take 0.25 mg by mouth daily.      Marland Kitchen loteprednol (LOTEMAX) 0.5 % ophthalmic suspension Place 1 drop into both eyes daily.       . polyethylene glycol (MIRALAX / GLYCOLAX) packet Take 17 g by mouth daily with breakfast.       . simvastatin (ZOCOR) 40 MG tablet Take 40 mg by mouth at bedtime.      . tamsulosin (FLOMAX) 0.4 MG CAPS capsule Take 0.4 mg by mouth daily.       No current facility-administered medications on file prior to visit.    Allergies: No Known Allergies  History   Social History  . Marital Status: Married    Spouse Name: N/A    Number of Children: N/A  . Years of Education: N/A   Occupational History  . Not on file.   Social History Main Topics  . Smoking status: Former Smoker -- 1.00 packs/day for 25 years    Types: Cigarettes    Quit date: 08/02/1961  . Smokeless tobacco: Never Used  . Alcohol Use: No  . Drug Use: No  . Sexual Activity: Not on file   Other Topics Concern  . Not on file   Social History Narrative  .  No narrative on file    Family History  Problem Relation Age of Onset  . Heart attack Father      Review of Systems:  See HPI for pertinent ROS. All other ROS negative.    Physical Exam: Blood pressure 120/64, pulse 76, temperature 97.9 F (36.6 C), temperature source Oral, resp. rate 18, height 5\' 9"  (1.753 m), weight 151 lb (68.493 kg)., Body mass index is 22.29 kg/(m^2). General: WNWD WM. Elderly. Appears in no acute distress. Neck: Supple. No thyromegaly. No lymphadenopathy. No carotid bruits. Lungs: Clear bilaterally to auscultation without wheezes, rales, or rhonchi. Breathing is unlabored. Heart: Irregular rhythm. Musculoskeletal:  Strength and tone normal for age. Extremities/Skin: Warm and dry.  Neuro: Alert and oriented X 3. Moves all extremities spontaneously.  CNII-XII grossly in tact.  Sensation at the plantar surface of the left foot is slightly decreased but  is present.  Sensation at the dorsal surface of the left foot: Pt reports NO sensation of entire dorsal surface of entire foot, up to level of ankle joint.  Sensation is intact of the left leg including the anterior shin as well as the posterior calf. Asked him to wiggle the toes of the left foot. Says that he cannot specifically he could not do this even one month ago. 5/5 bilateral grip strength and bilateral upper extremity strength.  He has a brace that he wears which wraps around his left calf and attaches to his left shoe. He can tightly closed his eyes bilaterally and open eyes bilaterally. Smile is fairly and symmetrical. Can shrug his shoulders equal bilaterally. Psych:  Responds to questions appropriately with a normal affect.     ASSESSMENT AND PLAN:  80 y.o. year old male with  1. Focal sensory loss - CBC with Differential - COMPLETE METABOLIC PANEL WITH GFR - TSH - Vitamin B12 - CT Head Wo Contrast; Future  2. Atrial fibrillation - CT Head Wo Contrast; Future  3. BRADYCARDIA-TACHYCARDIA SYNDROME  4. Pacemaker-[employer]   Discussed concern that he has possibly had a small stroke causing his new symptoms. Discussed his atrial fibrillation. His Coumadin had been stopped in the past because of recurrent falls. It was felt that his risk of all and subsequent cerebral bleed was greater than the risk of stroke. He is on Aspirin 81mg  QD. No other anticoagulant. Last creatinine of 08/07/13 was 1.44 so we'll try to get CT with out contrast. Cannot have MRI secondary to pacemaker. Discussed with patient and his daughter whether to even get CT scan. Even if it does confirm stroke not sure that this will change further management. However, after discussion, we'll plan to go ahead and obtain CT to have documentation and then he can follow up with cardiology to further discuss.  He and daughter both been informed that if he does develop further neurologic changes, call 911 for  transport to ER.  [address] [place], [place], [facility] 09/03/2013 4:38 PM

## 2013-09-04 ENCOUNTER — Ambulatory Visit
Admission: RE | Admit: 2013-09-04 | Discharge: 2013-09-04 | Disposition: A | Discharge status: Home / Self Care | Payer: Medicare Other | Source: Ambulatory Visit | Attending: Physician Assistant | Admitting: Physician Assistant

## 2013-09-04 DIAGNOSIS — R449 Unspecified symptoms and signs involving general sensations and perceptions: Secondary | ICD-10-CM

## 2013-09-04 DIAGNOSIS — I4891 Unspecified atrial fibrillation: Secondary | ICD-10-CM

## 2013-09-04 DIAGNOSIS — R208 Other disturbances of skin sensation: Principal | ICD-10-CM

## 2013-09-04 LAB — CBC WITH DIFFERENTIAL/PLATELET
BASOS ABS: 0 10*3/uL (ref 0.0–0.1)
Basophils Relative: 0 % (ref 0–1)
EOS ABS: 0.3 10*3/uL (ref 0.0–0.7)
EOS PCT: 5 % (ref 0–5)
HCT: 38 % — ABNORMAL LOW (ref 39.0–52.0)
Hemoglobin: 12.3 g/dL — ABNORMAL LOW (ref 13.0–17.0)
LYMPHS ABS: 1.2 10*3/uL (ref 0.7–4.0)
Lymphocytes Relative: 24 % (ref 12–46)
MCH: 32 pg (ref 26.0–34.0)
MCHC: 32.4 g/dL (ref 30.0–36.0)
MCV: 99 fL (ref 78.0–100.0)
Monocytes Absolute: 0.4 10*3/uL (ref 0.1–1.0)
Monocytes Relative: 8 % (ref 3–12)
NEUTROS PCT: 63 % (ref 43–77)
Neutro Abs: 3.1 10*3/uL (ref 1.7–7.7)
PLATELETS: 95 10*3/uL — AB (ref 150–400)
RBC: 3.84 MIL/uL — AB (ref 4.22–5.81)
RDW: 13.2 % (ref 11.5–15.5)
WBC: 5 10*3/uL (ref 4.0–10.5)

## 2013-09-04 LAB — COMPLETE METABOLIC PANEL WITH GFR
ALT: 11 U/L (ref 0–53)
AST: 22 U/L (ref 0–37)
Albumin: 4 g/dL (ref 3.5–5.2)
Alkaline Phosphatase: 43 U/L (ref 39–117)
BILIRUBIN TOTAL: 0.4 mg/dL (ref 0.2–1.2)
BUN: 17 mg/dL (ref 6–23)
CO2: 32 mEq/L (ref 19–32)
Calcium: 8.6 mg/dL (ref 8.4–10.5)
Chloride: 104 mEq/L (ref 96–112)
Creat: 1.29 mg/dL (ref 0.50–1.35)
GFR, Est African American: 56 mL/min — ABNORMAL LOW
GFR, Est Non African American: 49 mL/min — ABNORMAL LOW
Glucose, Bld: 86 mg/dL (ref 70–99)
Potassium: 4.5 mEq/L (ref 3.5–5.3)
SODIUM: 140 meq/L (ref 135–145)
Total Protein: 6.4 g/dL (ref 6.0–8.3)

## 2013-09-04 LAB — VITAMIN B12: VITAMIN B 12: 482 pg/mL (ref 211–911)

## 2013-09-04 LAB — TSH: TSH: 2.302 u[IU]/mL (ref 0.350–4.500)

## 2013-10-03 ENCOUNTER — Telehealth: Payer: Self-pay | Admitting: Family Medicine

## 2013-10-03 NOTE — Telephone Encounter (Signed)
Sent PA through [email or web address] to [facility]

## 2013-10-05 NOTE — Telephone Encounter (Signed)
PA has been approved through 10/03/14 - Approval faxed to pharmacy

## 2013-11-06 ENCOUNTER — Other Ambulatory Visit: Payer: Self-pay | Admitting: Internal Medicine

## 2013-11-12 ENCOUNTER — Ambulatory Visit
Admission: RE | Admit: 2013-11-12 | Discharge: 2013-11-12 | Disposition: A | Discharge status: Home / Self Care | Payer: Medicare Other | Source: Ambulatory Visit | Attending: Family Medicine | Admitting: Family Medicine

## 2013-11-12 ENCOUNTER — Telehealth: Payer: Self-pay | Admitting: *Deleted

## 2013-11-12 DIAGNOSIS — M25559 Pain in unspecified hip: Secondary | ICD-10-CM

## 2013-11-12 DIAGNOSIS — M549 Dorsalgia, unspecified: Secondary | ICD-10-CM

## 2013-11-12 NOTE — Telephone Encounter (Signed)
Xrays ordered.

## 2013-11-12 NOTE — Telephone Encounter (Signed)
Daughter called stating that pt fell last night is c/o of pain in hip and back and wants to know if he can get xray done, pt has appt for tomorrow, but daughter also wants to now if he can possiblly be seen today(no appts).

## 2013-11-12 NOTE — Telephone Encounter (Signed)
Order x-ray of the hip, I have opening this afternoon, but I'd like to get x-ray first.

## 2013-11-13 ENCOUNTER — Encounter: Payer: Self-pay | Admitting: Family Medicine

## 2013-11-13 ENCOUNTER — Ambulatory Visit (INDEPENDENT_AMBULATORY_CARE_PROVIDER_SITE_OTHER): Payer: Medicare Other | Admitting: Family Medicine

## 2013-11-13 VITALS — BP 118/62 | HR 62 | Temp 97.0°F | Resp 18 | Ht 70.0 in | Wt 149.0 lb

## 2013-11-13 DIAGNOSIS — M545 Low back pain, unspecified: Secondary | ICD-10-CM

## 2013-11-13 DIAGNOSIS — W19XXXA Unspecified fall, initial encounter: Secondary | ICD-10-CM

## 2013-11-13 MED ORDER — HYDROCODONE-ACETAMINOPHEN 5-325 MG PO TABS: 1.0000 | ORAL_TABLET | Freq: Four times a day (QID) | ORAL | Status: DC | PRN

## 2013-11-13 NOTE — Progress Notes (Signed)
Subjective:    Patient ID: [name], male    DOB: Feb 07, 1923, 80 y.o.   MRN: [id number]  HPI Yesterday the patient lost his balance and fell forward striking his head on the floor. He denies any loss of consciousness. He denies any headache today. He denies any neurologic deficits. Her since the fall he has had persistent low back pain around his tailbone. Ordered x-rays of his lumbar spine, and pelvis, and both hips. The x-rays of the lumbar spine revealed 5 mm spondylolisthesis on L4-L5. Otherwise there is chronic degenerative changes but no acute fracture. X-rays of the hips and pelvis revealed mild to moderate degenerative changes in both hips but no acute fracture. Past Medical History  Diagnosis Date  . Hyperlipidemia   . Atrial fibrillation     permanent afib  . Long-term (current) use of anticoagulants     previously on coumadin, stopped by Dr [name] due to frequent falls  . BRADYCARDIA-TACHYCARDIA SYNDROME 2007    s/p PPM by Dr [name]   Current Outpatient Prescriptions on File Prior to Visit  Medication Sig Dispense Refill  . aspirin EC 81 MG tablet Take 81 mg by mouth daily.      . digoxin (LANOXIN) 0.25 MG tablet Take 0.25 mg by mouth daily.      Marland Kitchen loteprednol (LOTEMAX) 0.5 % ophthalmic suspension Place 1 drop into both eyes daily.       . metoprolol succinate (TOPROL-XL) 25 MG 24 hr tablet TAKE 1 TABLET (25 MG TOTAL) BY MOUTH DAILY. TAKE WITH OR IMMEDIATELY FOLLOWING A MEAL.  90 tablet  0  . Omega-3 Fatty Acids (FISH OIL) 1000 MG CAPS Take 1 capsule by mouth daily.      . polyethylene glycol (MIRALAX / GLYCOLAX) packet Take 17 g by mouth daily with breakfast.       . simvastatin (ZOCOR) 40 MG tablet Take 40 mg by mouth at bedtime.      . tamsulosin (FLOMAX) 0.4 MG CAPS capsule Take 0.4 mg by mouth daily.       No current facility-administered medications on file prior to visit.   No Known Allergies History   Social History  . Marital Status: Married    Spouse  Name: N/A    Number of Children: N/A  . Years of Education: N/A   Occupational History  . Not on file.   Social History Main Topics  . Smoking status: Former Smoker -- 1.00 packs/day for 25 years    Types: Cigarettes    Quit date: 08/02/1961  . Smokeless tobacco: Never Used  . Alcohol Use: No  . Drug Use: No  . Sexual Activity: Not on file   Other Topics Concern  . Not on file   Social History Narrative  . No narrative on file      Review of Systems  All other systems reviewed and are negative.      Objective:   Physical Exam  Vitals reviewed. Constitutional: He is oriented to person, place, and time.  Cardiovascular: Normal rate and normal heart sounds.  An irregularly irregular rhythm present.  No murmur heard. Pulmonary/Chest: Effort normal and breath sounds normal.  Abdominal: Soft. Bowel sounds are normal.  Musculoskeletal:       Right hip: Normal.       Left hip: Normal.       Lumbar back: He exhibits tenderness, bony tenderness and pain. He exhibits normal range of motion, no swelling, no edema, no deformity, no laceration,  no spasm and normal pulse.  Neurological: He is alert and oriented to person, place, and time. He has normal reflexes. He displays normal reflexes. No cranial nerve deficit. He exhibits normal muscle tone. Coordination normal.          Assessment & Plan:  1. Low back pain  2. Fall  Patient suffered a bony contusion from the fall. There is no evidence of concussion or intracranial hemorrhage. There is no evidence of an acute fracture on x-rays of his hips pelvis or lumbar spine. Therefore I recommended treating his pain with hydrocodone/acetaminophen 5/325 one by mouth every 6 hours when necessary pain. I gave the patient 30 pills with 0 refills. His x-rays also show a large amount of stool in the colon. I recommended that they increase his MiraLax to prevent impaction and constipation due to the increased use of narcotics for his low  back pain

## 2013-11-19 ENCOUNTER — Other Ambulatory Visit: Payer: Self-pay | Admitting: Family Medicine

## 2013-11-21 ENCOUNTER — Encounter: Payer: Self-pay | Admitting: Internal Medicine

## 2013-11-21 ENCOUNTER — Ambulatory Visit (INDEPENDENT_AMBULATORY_CARE_PROVIDER_SITE_OTHER): Payer: Medicare Other | Admitting: Internal Medicine

## 2013-11-21 VITALS — BP 124/58 | HR 77 | Ht 70.0 in | Wt 145.1 lb

## 2013-11-21 DIAGNOSIS — Z950 Presence of cardiac pacemaker: Secondary | ICD-10-CM

## 2013-11-21 DIAGNOSIS — I4891 Unspecified atrial fibrillation: Secondary | ICD-10-CM

## 2013-11-21 DIAGNOSIS — I495 Sick sinus syndrome: Secondary | ICD-10-CM

## 2013-11-21 LAB — MDC_IDC_ENUM_SESS_TYPE_INCLINIC
Battery Voltage: 2.79 V
Lead Channel Pacing Threshold Pulse Width: 0.4 ms
Lead Channel Setting Sensing Sensitivity: 2.8 mV
MDC IDC MSMT LEADCHNL RV IMPEDANCE VALUE: 489 Ohm
MDC IDC MSMT LEADCHNL RV PACING THRESHOLD AMPLITUDE: 0.75 V
MDC IDC MSMT LEADCHNL RV SENSING INTR AMPL: 11.2 mV
MDC IDC SET LEADCHNL RV PACING AMPLITUDE: 2.5 V
MDC IDC SET LEADCHNL RV PACING PULSEWIDTH: 0.4 ms
MDC IDC STAT BRADY RV PERCENT PACED: 81.4 %

## 2013-11-21 NOTE — Progress Notes (Signed)
PCP: Odette Fraction, MD  [name] is a 80 y.o. male who presents today for routine electrophysiology followup.  Since his recent gen change, the patient reports doing reasonably well.  He has had problems with balance and falling but no syncope.   Today, he denies symptoms of palpitations, chest pain, shortness of breath,  lower extremity edema, dizziness, presyncope, or syncope.  The patient is otherwise without complaint today.   Past Medical History  Diagnosis Date  . Hyperlipidemia   . Atrial fibrillation     permanent afib  . Long-term (current) use of anticoagulants     previously on coumadin, stopped by Dr [name] due to frequent falls  . BRADYCARDIA-TACHYCARDIA SYNDROME 2007    s/p PPM by Dr [name]   Past Surgical History  Procedure Laterality Date  . Abdominal surgery  1986  . Hernia repair    . Pacemaker insertion  01/28/06; 08-07-13    PPM  (MDT) implanted by Dr [name] 2007; MDT [id number] gen change by Dr Rayann Heman 08/2013    Current Outpatient Prescriptions  Medication Sig Dispense Refill  . aspirin EC 81 MG tablet Take 81 mg by mouth daily.      . digoxin (LANOXIN) 0.25 MG tablet TAKE 1 TABLET BY MOUTH EVERY DAY  30 tablet  3  . HYDROcodone-acetaminophen (NORCO) 5-325 MG per tablet Take 1 tablet by mouth every 6 (six) hours as needed for moderate pain.  30 tablet  0  . loteprednol (LOTEMAX) 0.5 % ophthalmic suspension Place 1 drop into both eyes daily.       . metoprolol succinate (TOPROL-XL) 25 MG 24 hr tablet TAKE 1 TABLET (25 MG TOTAL) BY MOUTH DAILY. TAKE WITH OR IMMEDIATELY FOLLOWING A MEAL.  90 tablet  0  . Omega-3 Fatty Acids (FISH OIL) 1000 MG CAPS Take 1 capsule by mouth daily.      . polyethylene glycol (MIRALAX / GLYCOLAX) packet Take 17 g by mouth daily with breakfast.       . simvastatin (ZOCOR) 40 MG tablet Take 40 mg by mouth at bedtime.      . tamsulosin (FLOMAX) 0.4 MG CAPS capsule Take 0.4 mg by mouth daily.       No current  facility-administered medications for this visit.    Physical Exam: Filed Vitals:   11/21/13 1615  BP: 124/58  Pulse: 77  Height: 5\' 10"  (1.778 m)  Weight: 145 lb 1.9 oz (65.826 kg)    GEN- The patient is well appearing, alert and oriented x 3 today.   Head- normocephalic, atraumatic Eyes-  Sclera clear, conjunctiva pink Ears- hearing intact Oropharynx- clear Lungs- Clear to ausculation bilaterally, normal work of breathing Chest- pacemaker pocket is well healed Heart- irregular rate and rhythm GI- soft, NT, ND, + BS Extremities- no clubbing, cyanosis, or edema  Pacemaker interrogation- reviewed in detail today,  See PACEART report ekg today reveals afib 77 bpm, demand V pacing  Assessment and Plan:  1. Bradycardia Normal pacemaker function See Pace Art report No changes today  2. Permanent atrial fibrillation Not a candidate for anticoagulation due to frequent falls  Return to see [name] in 6 months I willl see again in 1 year

## 2013-11-21 NOTE — Patient Instructions (Addendum)
Your physician wants you to follow-up in: 6 months with Kathrene Alu and 12 months with Dr Rayann Heman.  You will receive a reminder letter in the mail two months in advance. If you don't receive a letter, please call our office to schedule the follow-up appointment.  Remote monitoring is used to monitor your Pacemaker of ICD from home. This monitoring reduces the number of office visits required to check your device to one time per year. It allows [place] to keep an eye on the functioning of your device to ensure it is working properly. You are scheduled for a device check from home on February 21, 2014. You may send your transmission at any time that day. If you have a wireless device, the transmission will be sent automatically. After your physician reviews your transmission, you will receive a postcard with your next transmission date.

## 2014-01-21 ENCOUNTER — Other Ambulatory Visit: Payer: Medicare Other

## 2014-01-21 DIAGNOSIS — Z79899 Other long term (current) drug therapy: Secondary | ICD-10-CM

## 2014-01-21 DIAGNOSIS — E785 Hyperlipidemia, unspecified: Secondary | ICD-10-CM

## 2014-01-21 DIAGNOSIS — Z0000 Encounter for general adult medical examination without abnormal findings: Secondary | ICD-10-CM

## 2014-01-21 LAB — COMPLETE METABOLIC PANEL WITH GFR
ALK PHOS: 44 U/L (ref 39–117)
ALT: 10 U/L (ref 0–53)
AST: 18 U/L (ref 0–37)
Albumin: 4.4 g/dL (ref 3.5–5.2)
BILIRUBIN TOTAL: 0.6 mg/dL (ref 0.2–1.2)
BUN: 24 mg/dL — ABNORMAL HIGH (ref 6–23)
CO2: 30 mEq/L (ref 19–32)
Calcium: 10 mg/dL (ref 8.4–10.5)
Chloride: 104 mEq/L (ref 96–112)
Creat: 1.37 mg/dL — ABNORMAL HIGH (ref 0.50–1.35)
GFR, EST NON AFRICAN AMERICAN: 45 mL/min — AB
GFR, Est African American: 52 mL/min — ABNORMAL LOW
Glucose, Bld: 89 mg/dL (ref 70–99)
Potassium: 4.5 mEq/L (ref 3.5–5.3)
Sodium: 142 mEq/L (ref 135–145)
TOTAL PROTEIN: 6.9 g/dL (ref 6.0–8.3)

## 2014-01-21 LAB — CBC WITH DIFFERENTIAL/PLATELET
BASOS PCT: 0 % (ref 0–1)
Basophils Absolute: 0 10*3/uL (ref 0.0–0.1)
EOS ABS: 0.1 10*3/uL (ref 0.0–0.7)
Eosinophils Relative: 4 % (ref 0–5)
HCT: 38.1 % — ABNORMAL LOW (ref 39.0–52.0)
Hemoglobin: 12.9 g/dL — ABNORMAL LOW (ref 13.0–17.0)
Lymphocytes Relative: 29 % (ref 12–46)
Lymphs Abs: 1.1 10*3/uL (ref 0.7–4.0)
MCH: 32.2 pg (ref 26.0–34.0)
MCHC: 33.9 g/dL (ref 30.0–36.0)
MCV: 95 fL (ref 78.0–100.0)
Monocytes Absolute: 0.3 10*3/uL (ref 0.1–1.0)
Monocytes Relative: 9 % (ref 3–12)
Neutro Abs: 2.1 10*3/uL (ref 1.7–7.7)
Neutrophils Relative %: 58 % (ref 43–77)
PLATELETS: 137 10*3/uL — AB (ref 150–400)
RBC: 4.01 MIL/uL — ABNORMAL LOW (ref 4.22–5.81)
RDW: 13.4 % (ref 11.5–15.5)
WBC: 3.7 10*3/uL — ABNORMAL LOW (ref 4.0–10.5)

## 2014-01-21 LAB — LIPID PANEL
CHOL/HDL RATIO: 3.3 ratio
Cholesterol: 137 mg/dL (ref 0–200)
HDL: 42 mg/dL (ref >39)
LDL CALC: 73 mg/dL (ref 0–99)
TRIGLYCERIDES: 112 mg/dL (ref <150)
VLDL: 22 mg/dL (ref 0–40)

## 2014-01-21 LAB — TSH: TSH: 1.582 u[IU]/mL (ref 0.350–4.500)

## 2014-01-24 ENCOUNTER — Ambulatory Visit (INDEPENDENT_AMBULATORY_CARE_PROVIDER_SITE_OTHER): Payer: Medicare Other | Admitting: Family Medicine

## 2014-01-24 ENCOUNTER — Encounter: Payer: Self-pay | Admitting: Family Medicine

## 2014-01-24 VITALS — BP 122/64 | HR 80 | Temp 97.3°F | Resp 16 | Ht 70.0 in | Wt 144.0 lb

## 2014-01-24 DIAGNOSIS — Z0000 Encounter for general adult medical examination without abnormal findings: Secondary | ICD-10-CM

## 2014-01-24 DIAGNOSIS — R29898 Other symptoms and signs involving the musculoskeletal system: Secondary | ICD-10-CM

## 2014-01-24 DIAGNOSIS — Z23 Encounter for immunization: Secondary | ICD-10-CM

## 2014-01-24 NOTE — Progress Notes (Signed)
Subjective:    Patient ID: [name], male    DOB: 10-26-1922, 80 y.o.   MRN: [id number]  HPI    Patient presents for Medicare Annual/Subsequent preventive examination.  Patient will be due for a prostate exam as well as a colonoscopy. Patient had Pneumovax 23 in the remote past. He is due for Prevnar 13. He is also due for Zostavax and a repeat tetanus vaccine. His biggest concern is weakness in his legs and falling. He feels unsteady on his feet. He has a chronic left foot drop. 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  This causes him to use a tracker pain everywhere he goes.   His most recent labwork is listed below: Lab on 01/21/2014  Component Date Value Ref Range Status  . Cholesterol 01/21/2014 137  0 - 200 mg/dL Final   Comment: ATP III Classification:                                < 200        mg/dL        Desirable                               200 - 239     mg/dL        Borderline High                               >= 240        mg/dL        High                             . Triglycerides 01/21/2014 112  <150 mg/dL Final  . HDL 01/21/2014 42  >39 mg/dL Final  . Total CHOL/HDL Ratio 01/21/2014 3.3   Final  . VLDL 01/21/2014 22  0 - 40 mg/dL Final  . LDL Cholesterol 01/21/2014 73  0 - 99 mg/dL Final   Comment:                            Total Cholesterol/HDL Ratio:CHD Risk                                                 Coronary Heart Disease Risk Table                                                                 Men       Women                                   1/2 Average Risk              3.4        3.3  Average Risk              5.0        4.4                                    2X Average Risk              9.6        7.1                                    3X Average Risk             23.4       11.0                          Use the calculated Patient Ratio above and the CHD Risk  table                           to determine the patient's CHD Risk.                          ATP III Classification (LDL):                                < 100        mg/dL         Optimal                               100 - 129     mg/dL         Near or Above Optimal                               130 - 159     mg/dL         Borderline High                               160 - 189     mg/dL         High                                > 190        mg/dL         Very High                             . TSH 01/21/2014 1.582  0.350 - 4.500 uIU/mL Final  . WBC 01/21/2014 3.7* 4.0 - 10.5 K/uL Final  . RBC 01/21/2014 4.01* 4.22 - 5.81 MIL/uL Final  . Hemoglobin 01/21/2014 12.9* 13.0 - 17.0 g/dL Final  . HCT 66/81/5947 38.1* 39.0 - 52.0 % Final  . MCV 01/21/2014 95.0  78.0 - 100.0 fL Final  . MCH 01/21/2014 32.2  26.0 - 34.0 pg Final  . MCHC 01/21/2014 33.9  30.0 - 36.0 g/dL Final  . RDW  01/21/2014 13.4  11.5 - 15.5 % Final  . Platelets 01/21/2014 137* 150 - 400 K/uL Final  . Neutrophils Relative % 01/21/2014 58  43 - 77 % Final  . Neutro Abs 01/21/2014 2.1  1.7 - 7.7 K/uL Final  . Lymphocytes Relative 01/21/2014 29  12 - 46 % Final  . Lymphs Abs 01/21/2014 1.1  0.7 - 4.0 K/uL Final  . Monocytes Relative 01/21/2014 9  3 - 12 % Final  . Monocytes Absolute 01/21/2014 0.3  0.1 - 1.0 K/uL Final  . Eosinophils Relative 01/21/2014 4  0 - 5 % Final  . Eosinophils Absolute 01/21/2014 0.1  0.0 - 0.7 K/uL Final  . Basophils Relative 01/21/2014 0  0 - 1 % Final  . Basophils Absolute 01/21/2014 0.0  0.0 - 0.1 K/uL Final  . Smear Review 01/21/2014 Criteria for review not met   Final  . Sodium 01/21/2014 142  135 - 145 mEq/L Final  . Potassium 01/21/2014 4.5  3.5 - 5.3 mEq/L Final  . Chloride 01/21/2014 104  96 - 112 mEq/L Final  . CO2 01/21/2014 30  19 - 32 mEq/L Final  . Glucose, Bld 01/21/2014 89  70 - 99 mg/dL Final  . BUN 01/21/2014 24* 6 - 23 mg/dL Final  . Creat 01/21/2014 1.37* 0.50 - 1.35 mg/dL  Final  . Total Bilirubin 01/21/2014 0.6  0.2 - 1.2 mg/dL Final  . Alkaline Phosphatase 01/21/2014 44  39 - 117 U/L Final  . AST 01/21/2014 18  0 - 37 U/L Final  . ALT 01/21/2014 10  0 - 53 U/L Final  . Total Protein 01/21/2014 6.9  6.0 - 8.3 g/dL Final  . Albumin 01/21/2014 4.4  3.5 - 5.2 g/dL Final  . Calcium 01/21/2014 10.0  8.4 - 10.5 mg/dL Final  . GFR, Est African American 01/21/2014 52*  Final  . GFR, Est Non African American 01/21/2014 45*  Final   Comment:                            The estimated GFR is a calculation valid for adults (>=34 years old)                          that uses the CKD-EPI algorithm to adjust for age and sex. It is                            not to be used for children, pregnant women, hospitalized patients,                             patients on dialysis, or with rapidly changing kidney function.                          According to the NKDEP, eGFR >89 is normal, 60-89 shows mild                          impairment, 30-59 shows moderate impairment, 15-29 shows severe                          impairment and <15 is ESRD.  Review Past Medical/Family/Social: Past Medical History  Diagnosis Date  . Hyperlipidemia   . Atrial fibrillation     permanent afib  . Long-term (current) use of anticoagulants     previously on coumadin, stopped by Dr [name] due to frequent falls  . BRADYCARDIA-TACHYCARDIA SYNDROME 2007    s/p PPM by Dr [name]   Past Surgical History  Procedure Laterality Date  . Abdominal surgery  1986  . Hernia repair    . Pacemaker insertion  01/28/06; 08-07-13    PPM  (MDT) implanted by Dr [name] 2007; MDT [id number] gen change by Dr Rayann Heman 08/2013   Current Outpatient Prescriptions on File Prior to Visit  Medication Sig Dispense Refill  . digoxin (LANOXIN) 0.25 MG tablet TAKE 1 TABLET BY MOUTH EVERY DAY  30 tablet  3  . loteprednol (LOTEMAX) 0.5 % ophthalmic suspension Place 1 drop into both eyes daily.         . metoprolol succinate (TOPROL-XL) 25 MG 24 hr tablet TAKE 1 TABLET (25 MG TOTAL) BY MOUTH DAILY. TAKE WITH OR IMMEDIATELY FOLLOWING A MEAL.  90 tablet  0  . Omega-3 Fatty Acids (FISH OIL) 1000 MG CAPS Take 1 capsule by mouth daily.      . polyethylene glycol (MIRALAX / GLYCOLAX) packet Take 17 g by mouth daily with breakfast.       . simvastatin (ZOCOR) 40 MG tablet Take 40 mg by mouth at bedtime.      . tamsulosin (FLOMAX) 0.4 MG CAPS capsule Take 0.4 mg by mouth daily.       No current facility-administered medications on file prior to visit.   No Known Allergies History   Social History  . Marital Status: Married    Spouse Name: N/A    Number of Children: N/A  . Years of Education: N/A   Occupational History  . Not on file.   Social History Main Topics  . Smoking status: Former Smoker -- 1.00 packs/day for 25 years    Types: Cigarettes    Quit date: 08/02/1961  . Smokeless tobacco: Never Used  . Alcohol Use: No  . Drug Use: No  . Sexual Activity: Not on file   Other Topics Concern  . Not on file   Social History Narrative  . No narrative on file   Family History  Problem Relation Age of Onset  . Heart attack Father      Depression Screen  (Note: if answer to either of the following is "Yes", a more complete depression screening is indicated)  Over the past two weeks, have you felt down, depressed or hopeless? No Over the past two weeks, have you felt little interest or pleasure in doing things? No Have you lost interest or pleasure in daily life? No Do you often feel hopeless? No Do you cry easily over simple problems? No   Activities of Daily Living  In your present state of health, do you have any difficulty performing the following activities?:  Driving? No  Managing money? No  Feeding yourself? No  Getting from bed to chair? No  Climbing a flight of stairs? No  Preparing food and eating?: No  Bathing or showering? No  Getting dressed: No  Getting  to the toilet? No  Using the toilet:No  Moving around from place to place: No  In the past year have you fallen or had a near fall?:No  Are you sexually active? No  Do you have more than one partner? No  Hearing Difficulties: YES Do you often ask people to speak up or repeat themselves? Yes Do you experience ringing or noises in your ears? Yes Do you have difficulty understanding soft or whispered voices? No  Do you feel that you have a problem with memory? No Do you often misplace items? No  Do you feel safe at home? Yes  Cognitive Testing  Alert? Yes Normal Appearance?Yes  Oriented to person? Yes Place? Yes  Time? Yes  Recall of three objects? Yes  Can perform simple calculations? Yes  Displays appropriate judgment?Yes  Can read the correct time from a watch face?Yes       Assessment:    Annual wellness medicare exam   Plan:          Review of Systems  All other systems reviewed and are negative.      Objective:   Physical Exam  Vitals reviewed. Constitutional: He is oriented to person, place, and time. He appears well-developed and well-nourished. No distress.  HENT:  Head: Normocephalic and atraumatic.  Right Ear: External ear normal.  Left Ear: External ear normal.  Nose: Nose normal.  Mouth/Throat: Oropharynx is clear and moist. No oropharyngeal exudate.  Eyes: Conjunctivae and EOM are normal. Pupils are equal, round, and reactive to light. Right eye exhibits no discharge. Left eye exhibits no discharge. No scleral icterus.  Neck: Normal range of motion. Neck supple. No JVD present. No tracheal deviation present. No thyromegaly present.  Cardiovascular: Normal rate and normal heart sounds.  An irregularly irregular rhythm present.  No murmur heard. Pulmonary/Chest: Effort normal and breath sounds normal. No stridor. No respiratory distress. He has no wheezes. He has no rales. He exhibits no tenderness.  Abdominal: Soft. Bowel sounds are normal. He  exhibits no distension and no mass. There is no tenderness. There is no rebound and no guarding.  Musculoskeletal: He exhibits tenderness. He exhibits no edema.  Lymphadenopathy:    He has no cervical adenopathy.  Neurological: He is alert and oriented to person, place, and time. He has normal reflexes. He displays normal reflexes. No cranial nerve deficit. He exhibits normal muscle tone. Coordination normal.  Skin: Skin is warm. No rash noted. He is not diaphoretic. No erythema. No pallor.  Psychiatric: He has a normal mood and affect. His behavior is normal. Judgment and thought content normal.          Assessment & Plan:   A long discussion with patient today regarding prostate cancer screening colon cancer screening. We both agreed that this would not be medically prudent. The patient agreed to the [place] 13 today in the office. I recommended Zostavax. He will inquire as to the Price prior to receiving the vaccination we also discussed tetanus vaccines but he declined. I will refer the patient to physical therapy due to his weakness and instability on his feet. Screen negative for depression. Medicare Attestation  I have personally reviewed:  The patient's medical and social history  Their use of alcohol, tobacco or illicit drugs  Their current medications and supplements  The patient's functional ability including ADLs,fall risks, home safety risks, cognitive, and hearing and visual impairment  Diet and physical activities  Evidence for depression or mood disorders  The patient's weight, height, BMI, and visual acuity have been recorded in the chart. I have made referrals, counseling, and provided education to the patient based on review of the above and I have provided the patient with a written personalized care plan for preventive services.

## 2014-02-11 ENCOUNTER — Ambulatory Visit: Payer: Medicare Other | Attending: Family Medicine | Admitting: Physical Therapy

## 2014-02-11 DIAGNOSIS — R262 Difficulty in walking, not elsewhere classified: Secondary | ICD-10-CM | POA: Insufficient documentation

## 2014-02-11 DIAGNOSIS — M6281 Muscle weakness (generalized): Secondary | ICD-10-CM | POA: Insufficient documentation

## 2014-02-21 ENCOUNTER — Encounter: Payer: Medicare Other | Admitting: *Deleted

## 2014-02-21 ENCOUNTER — Telehealth: Payer: Self-pay | Admitting: Cardiology

## 2014-02-21 NOTE — Telephone Encounter (Signed)
Spoke with pt and reminded pt of remote transmission that is due today. Pt verbalized understanding.   

## 2014-02-22 ENCOUNTER — Encounter: Payer: Self-pay | Admitting: Cardiology

## 2014-02-25 ENCOUNTER — Ambulatory Visit: Payer: Medicare Other | Admitting: Physical Therapy

## 2014-02-25 DIAGNOSIS — M6281 Muscle weakness (generalized): Secondary | ICD-10-CM | POA: Diagnosis not present

## 2014-02-27 ENCOUNTER — Other Ambulatory Visit: Payer: Self-pay | Admitting: Internal Medicine

## 2014-02-28 ENCOUNTER — Ambulatory Visit: Payer: Medicare Other | Admitting: Physical Therapy

## 2014-02-28 DIAGNOSIS — M6281 Muscle weakness (generalized): Secondary | ICD-10-CM | POA: Diagnosis not present

## 2014-03-05 ENCOUNTER — Ambulatory Visit: Payer: Medicare Other | Attending: Family Medicine | Admitting: Physical Therapy

## 2014-03-05 ENCOUNTER — Other Ambulatory Visit: Payer: Self-pay | Admitting: Family Medicine

## 2014-03-05 DIAGNOSIS — R262 Difficulty in walking, not elsewhere classified: Secondary | ICD-10-CM | POA: Insufficient documentation

## 2014-03-05 DIAGNOSIS — M6281 Muscle weakness (generalized): Secondary | ICD-10-CM | POA: Diagnosis not present

## 2014-03-05 NOTE — Telephone Encounter (Signed)
Refill appropriate and filled per protocol. 

## 2014-03-07 ENCOUNTER — Ambulatory Visit: Payer: Medicare Other | Admitting: Physical Therapy

## 2014-03-07 DIAGNOSIS — M6281 Muscle weakness (generalized): Secondary | ICD-10-CM | POA: Diagnosis not present

## 2014-03-11 ENCOUNTER — Ambulatory Visit: Payer: Medicare Other | Admitting: Physical Therapy

## 2014-03-11 DIAGNOSIS — M6281 Muscle weakness (generalized): Secondary | ICD-10-CM | POA: Diagnosis not present

## 2014-03-14 ENCOUNTER — Ambulatory Visit: Payer: Medicare Other | Admitting: Physical Therapy

## 2014-03-18 ENCOUNTER — Ambulatory Visit: Payer: Medicare Other | Admitting: Physical Therapy

## 2014-03-18 DIAGNOSIS — M6281 Muscle weakness (generalized): Secondary | ICD-10-CM | POA: Diagnosis not present

## 2014-03-21 ENCOUNTER — Ambulatory Visit: Payer: Medicare Other | Admitting: Physical Therapy

## 2014-03-21 DIAGNOSIS — M6281 Muscle weakness (generalized): Secondary | ICD-10-CM | POA: Diagnosis not present

## 2014-03-25 ENCOUNTER — Ambulatory Visit: Payer: Medicare Other | Admitting: Physical Therapy

## 2014-03-25 DIAGNOSIS — M6281 Muscle weakness (generalized): Secondary | ICD-10-CM | POA: Diagnosis not present

## 2014-03-28 ENCOUNTER — Ambulatory Visit: Payer: Medicare Other | Admitting: Physical Therapy

## 2014-03-28 DIAGNOSIS — M6281 Muscle weakness (generalized): Secondary | ICD-10-CM | POA: Diagnosis not present

## 2014-04-02 ENCOUNTER — Ambulatory Visit: Payer: Medicare Other | Attending: Family Medicine | Admitting: Physical Therapy

## 2014-04-02 DIAGNOSIS — R262 Difficulty in walking, not elsewhere classified: Secondary | ICD-10-CM | POA: Insufficient documentation

## 2014-04-02 DIAGNOSIS — M6281 Muscle weakness (generalized): Secondary | ICD-10-CM | POA: Diagnosis present

## 2014-04-04 ENCOUNTER — Ambulatory Visit: Payer: Medicare Other | Admitting: Physical Therapy

## 2014-04-04 DIAGNOSIS — M6281 Muscle weakness (generalized): Secondary | ICD-10-CM | POA: Diagnosis not present

## 2014-04-06 ENCOUNTER — Other Ambulatory Visit: Payer: Self-pay | Admitting: Family Medicine

## 2014-04-06 NOTE — Telephone Encounter (Signed)
Refill appropriate and filled per protocol. 

## 2014-04-10 ENCOUNTER — Other Ambulatory Visit: Payer: Self-pay | Admitting: Family Medicine

## 2014-04-11 ENCOUNTER — Ambulatory Visit: Payer: Medicare Other | Admitting: Physical Therapy

## 2014-04-11 ENCOUNTER — Telehealth: Payer: Self-pay | Admitting: Family Medicine

## 2014-04-11 DIAGNOSIS — M6281 Muscle weakness (generalized): Secondary | ICD-10-CM | POA: Diagnosis not present

## 2014-04-11 NOTE — Telephone Encounter (Signed)
[name] daughter calling to speak with you regarding him sleeping so much [phone]

## 2014-04-12 NOTE — Telephone Encounter (Signed)
LMOM to RC 

## 2014-04-12 NOTE — Telephone Encounter (Signed)
Spoke to pts dtr and aware of below and appt made

## 2014-04-12 NOTE — Telephone Encounter (Signed)
Should likely be see to exclude UTI or some type of infection vs medication.

## 2014-04-12 NOTE — Telephone Encounter (Signed)
Patients daughter states he has no energy and sleeps all day long. Daughter is concerned and is not sure if there is a medication causing the fatigue or if she should be checking. He was last seen on June 25 in office with labs drawn. She states the fatigue is getting worse for the last 2 months. Last night his BP was 116/64. Please advise.

## 2014-04-15 ENCOUNTER — Ambulatory Visit: Payer: Medicare Other | Admitting: Physical Therapy

## 2014-04-15 DIAGNOSIS — M6281 Muscle weakness (generalized): Secondary | ICD-10-CM | POA: Diagnosis not present

## 2014-04-16 ENCOUNTER — Ambulatory Visit (INDEPENDENT_AMBULATORY_CARE_PROVIDER_SITE_OTHER): Payer: Medicare Other | Admitting: Family Medicine

## 2014-04-16 ENCOUNTER — Encounter: Payer: Self-pay | Admitting: Family Medicine

## 2014-04-16 VITALS — BP 110/68 | HR 68 | Temp 97.7°F | Resp 16 | Ht 70.0 in | Wt 141.0 lb

## 2014-04-16 DIAGNOSIS — Z79899 Other long term (current) drug therapy: Secondary | ICD-10-CM

## 2014-04-16 DIAGNOSIS — R5383 Other fatigue: Secondary | ICD-10-CM

## 2014-04-16 DIAGNOSIS — R5381 Other malaise: Secondary | ICD-10-CM

## 2014-04-16 DIAGNOSIS — R350 Frequency of micturition: Secondary | ICD-10-CM

## 2014-04-16 DIAGNOSIS — Z23 Encounter for immunization: Secondary | ICD-10-CM

## 2014-04-16 LAB — URINALYSIS, ROUTINE W REFLEX MICROSCOPIC
Bilirubin Urine: NEGATIVE
Glucose, UA: NEGATIVE mg/dL
Hgb urine dipstick: NEGATIVE
Ketones, ur: NEGATIVE mg/dL
Leukocytes, UA: NEGATIVE
NITRITE: NEGATIVE
Protein, ur: NEGATIVE mg/dL
Specific Gravity, Urine: 1.015 (ref 1.005–1.030)
UROBILINOGEN UA: 0.2 mg/dL (ref 0.0–1.0)
pH: 6.5 (ref 5.0–8.0)

## 2014-04-16 NOTE — Progress Notes (Signed)
Subjective:    Patient ID: [name], male    DOB: 04-Mar-1923, 80 y.o.   MRN: [id number]  HPI Patient is a very pleasant 80 year old white male who has a history of atrial fibrillation tachybradycardia syndrome and is pacemaker dependent. He presents today with his daughter. Last month, the patient has developed worsening fatigue. He has no energy. He would sleep all night. He was awake at the breakfast and then take a nap until lunch. He would wake up for lunch and then take a nap until dinner. He then sleeps the remainder of the day. He also reports malaise and physical fatigue along with hypersomnolence. He is lost 8 pounds since April due to lack of appetite. Otherwise he is asymptomatic. He denies any chest pain shortness breath dyspnea on exertion. He denies any dysuria or hematuria. He denies any cough or pleurisy. He denies abdominal pain nausea vomiting or diarrhea. He denies any constipation. His urinalysis today is completely normal with no evidence of urinary tract infection. He denies any depression. There have been no mental status changes to suggest dementia. Past Medical History  Diagnosis Date  . Hyperlipidemia   . Atrial fibrillation     permanent afib  . Long-term (current) use of anticoagulants     previously on coumadin, stopped by Dr [name] due to frequent falls  . BRADYCARDIA-TACHYCARDIA SYNDROME 2007    s/p PPM by Dr [name]   Past Surgical History  Procedure Laterality Date  . Abdominal surgery  1986  . Hernia repair    . Pacemaker insertion  01/28/06; 08-07-13    PPM  (MDT) implanted by Dr [name] 2007; MDT [id number] gen change by Dr Rayann Heman 08/2013   Current Outpatient Prescriptions on File Prior to Visit  Medication Sig Dispense Refill  . acetaminophen (TYLENOL ARTHRITIS PAIN) 650 MG CR tablet Take 650 mg by mouth 2 (two) times daily.      Marland Kitchen aspirin 81 MG tablet Take 81 mg by mouth daily.      . digoxin (LANOXIN) 0.25 MG tablet TAKE 1 TABLET BY MOUTH EVERY DAY   30 tablet  3  . loteprednol (LOTEMAX) 0.5 % ophthalmic suspension Place 1 drop into both eyes daily.       . metoprolol succinate (TOPROL-XL) 25 MG 24 hr tablet TAKE 1 TABLET (25 MG TOTAL) BY MOUTH DAILY. TAKE WITH OR IMMEDIATELY FOLLOWING A MEAL.  90 tablet  0  . Omega-3 Fatty Acids (FISH OIL) 1000 MG CAPS Take 1 capsule by mouth daily.      . polyethylene glycol (MIRALAX / GLYCOLAX) packet Take 17 g by mouth daily with breakfast.       . simvastatin (ZOCOR) 40 MG tablet TAKE 1 TABLET BY MOUTH AT BEDTIME  90 tablet  1  . tamsulosin (FLOMAX) 0.4 MG CAPS capsule TAKE 1 CAPSULE (0.4 MG TOTAL) BY MOUTH DAILY.  30 capsule  11   No current facility-administered medications on file prior to visit.   No Known Allergies History   Social History  . Marital Status: Married    Spouse Name: N/A    Number of Children: N/A  . Years of Education: N/A   Occupational History  . Not on file.   Social History Main Topics  . Smoking status: Former Smoker -- 1.00 packs/day for 25 years    Types: Cigarettes    Quit date: 08/02/1961  . Smokeless tobacco: Never Used  . Alcohol Use: No  . Drug Use: No  . Sexual  Activity: Not on file   Other Topics Concern  . Not on file   Social History Narrative  . No narrative on file      Review of Systems  All other systems reviewed and are negative.      Objective:   Physical Exam  Vitals reviewed. Constitutional: He is oriented to person, place, and time. He appears well-developed and well-nourished. No distress.  HENT:  Nose: Nose normal.  Mouth/Throat: Oropharynx is clear and moist. No oropharyngeal exudate.  Eyes: Conjunctivae are normal. No scleral icterus.  Neck: Neck supple. No thyromegaly present.  Cardiovascular: Normal rate, regular rhythm and normal heart sounds.   No murmur heard. Pulmonary/Chest: Effort normal and breath sounds normal. No respiratory distress. He has no wheezes. He has no rales. He exhibits no tenderness.    Abdominal: Soft. Bowel sounds are normal. He exhibits no distension. There is no tenderness. There is no rebound and no guarding.  Musculoskeletal: He exhibits no edema.  Lymphadenopathy:    He has no cervical adenopathy.  Neurological: He is alert and oriented to person, place, and time. He has normal reflexes. No cranial nerve deficit. He exhibits abnormal muscle tone. Coordination normal.  Skin: He is not diaphoretic.  Psychiatric: He has a normal mood and affect. His behavior is normal. Judgment and thought content normal.          Assessment & Plan:  Frequent urination - Plan: Urinalysis, Routine w reflex microscopic  Other malaise and fatigue - Plan: CBC with Differential, COMPLETE METABOLIC PANEL WITH GFR, TSH, Vitamin B12, Testosterone  Encounter for long-term (current) use of other medications - Plan: Vitamin B12, Digoxin level  patient's physical exam is benign. His urinalysis is normal. I will check a CBC to evaluate for anemia or bone marrow problems. Objective CMP to evaluate his renal and liver function. Check TSH to rule out hypothyroidism. Objective testosterone level and a vitamin B12 level. Also check digitoxin level to rule out digoxin toxicity although he shows no ataxia.Marland Kitchen  get the lab work is normal, we could consider doing a sleep study to rule out sleep apnea. We could also consider to start a workup for occult malignancy involving chest x-ray as well as a CT scan of abdomen and pelvis. Await the results of the lab work. The family will consider a sleep study. Depending on lab abnormalities we may want to obtain a chest x-ray as well as an CT scan of abdomen and pelvis.  I also discussed with the patient and his daughter that this may also be due to his advancing age.  However this is a diagnosis of exclusion.

## 2014-04-16 NOTE — Addendum Note (Signed)
Addended by: Shary Decamp B on: 04/16/2014 03:53 PM   Modules accepted: Orders

## 2014-04-17 LAB — CBC WITH DIFFERENTIAL/PLATELET
BASOS ABS: 0 10*3/uL (ref 0.0–0.1)
Basophils Relative: 0 % (ref 0–1)
EOS ABS: 0.1 10*3/uL (ref 0.0–0.7)
Eosinophils Relative: 3 % (ref 0–5)
HCT: 36.6 % — ABNORMAL LOW (ref 39.0–52.0)
Hemoglobin: 12 g/dL — ABNORMAL LOW (ref 13.0–17.0)
LYMPHS ABS: 1.2 10*3/uL (ref 0.7–4.0)
Lymphocytes Relative: 24 % (ref 12–46)
MCH: 32.3 pg (ref 26.0–34.0)
MCHC: 32.8 g/dL (ref 30.0–36.0)
MCV: 98.7 fL (ref 78.0–100.0)
Monocytes Absolute: 0.3 10*3/uL (ref 0.1–1.0)
Monocytes Relative: 7 % (ref 3–12)
NEUTROS PCT: 66 % (ref 43–77)
Neutro Abs: 3.2 10*3/uL (ref 1.7–7.7)
PLATELETS: 118 10*3/uL — AB (ref 150–400)
RBC: 3.71 MIL/uL — ABNORMAL LOW (ref 4.22–5.81)
RDW: 13.6 % (ref 11.5–15.5)
WBC: 4.8 10*3/uL (ref 4.0–10.5)

## 2014-04-17 LAB — COMPLETE METABOLIC PANEL WITH GFR
ALK PHOS: 49 U/L (ref 39–117)
ALT: 11 U/L (ref 0–53)
AST: 22 U/L (ref 0–37)
Albumin: 4.3 g/dL (ref 3.5–5.2)
BILIRUBIN TOTAL: 0.5 mg/dL (ref 0.2–1.2)
BUN: 21 mg/dL (ref 6–23)
CO2: 30 meq/L (ref 19–32)
CREATININE: 1.34 mg/dL (ref 0.50–1.35)
Calcium: 9.1 mg/dL (ref 8.4–10.5)
Chloride: 101 mEq/L (ref 96–112)
GFR, EST AFRICAN AMERICAN: 54 mL/min — AB
GFR, Est Non African American: 47 mL/min — ABNORMAL LOW
Glucose, Bld: 78 mg/dL (ref 70–99)
Potassium: 4.3 mEq/L (ref 3.5–5.3)
Sodium: 138 mEq/L (ref 135–145)
Total Protein: 6.9 g/dL (ref 6.0–8.3)

## 2014-04-17 LAB — TSH: TSH: 2.442 u[IU]/mL (ref 0.350–4.500)

## 2014-04-17 LAB — DIGOXIN LEVEL: DIGOXIN LVL: 1.3 ng/mL (ref 0.8–2.0)

## 2014-04-17 LAB — TESTOSTERONE: Testosterone: 252 ng/dL — ABNORMAL LOW (ref 300–890)

## 2014-04-17 LAB — VITAMIN B12: VITAMIN B 12: 480 pg/mL (ref 211–911)

## 2014-04-18 ENCOUNTER — Ambulatory Visit: Payer: Medicare Other | Admitting: Physical Therapy

## 2014-04-18 DIAGNOSIS — M6281 Muscle weakness (generalized): Secondary | ICD-10-CM | POA: Diagnosis not present

## 2014-04-23 ENCOUNTER — Ambulatory Visit: Payer: Medicare Other | Admitting: Physical Therapy

## 2014-04-23 ENCOUNTER — Encounter: Payer: Self-pay | Admitting: Family Medicine

## 2014-04-23 DIAGNOSIS — M6281 Muscle weakness (generalized): Secondary | ICD-10-CM | POA: Diagnosis not present

## 2014-04-25 ENCOUNTER — Ambulatory Visit: Payer: Medicare Other | Admitting: Physical Therapy

## 2014-04-25 DIAGNOSIS — M6281 Muscle weakness (generalized): Secondary | ICD-10-CM | POA: Diagnosis not present

## 2014-04-29 ENCOUNTER — Ambulatory Visit: Payer: Medicare Other | Admitting: Physical Therapy

## 2014-04-29 DIAGNOSIS — M6281 Muscle weakness (generalized): Secondary | ICD-10-CM | POA: Diagnosis not present

## 2014-05-02 ENCOUNTER — Ambulatory Visit: Payer: Medicare Other | Attending: Family Medicine | Admitting: Physical Therapy

## 2014-05-02 DIAGNOSIS — Z950 Presence of cardiac pacemaker: Secondary | ICD-10-CM | POA: Insufficient documentation

## 2014-05-02 DIAGNOSIS — M6281 Muscle weakness (generalized): Secondary | ICD-10-CM | POA: Insufficient documentation

## 2014-05-02 DIAGNOSIS — R262 Difficulty in walking, not elsewhere classified: Secondary | ICD-10-CM | POA: Insufficient documentation

## 2014-05-07 ENCOUNTER — Ambulatory Visit: Payer: Medicare Other | Admitting: Physical Therapy

## 2014-05-07 DIAGNOSIS — M6281 Muscle weakness (generalized): Secondary | ICD-10-CM | POA: Diagnosis not present

## 2014-05-14 ENCOUNTER — Ambulatory Visit: Payer: Medicare Other | Admitting: Physical Therapy

## 2014-05-14 DIAGNOSIS — M6281 Muscle weakness (generalized): Secondary | ICD-10-CM | POA: Diagnosis not present

## 2014-05-15 ENCOUNTER — Encounter: Payer: Self-pay | Admitting: Cardiology

## 2014-05-17 ENCOUNTER — Other Ambulatory Visit: Payer: Self-pay

## 2014-06-01 ENCOUNTER — Other Ambulatory Visit: Payer: Self-pay | Admitting: Internal Medicine

## 2014-07-11 ENCOUNTER — Encounter (HOSPITAL_COMMUNITY): Payer: Self-pay | Admitting: Internal Medicine

## 2014-07-17 ENCOUNTER — Other Ambulatory Visit: Payer: Self-pay | Admitting: Family Medicine

## 2014-07-17 NOTE — Telephone Encounter (Signed)
Medication refilled per protocol. 

## 2014-07-22 ENCOUNTER — Ambulatory Visit (INDEPENDENT_AMBULATORY_CARE_PROVIDER_SITE_OTHER): Payer: Medicare Other | Admitting: Internal Medicine

## 2014-07-22 ENCOUNTER — Encounter: Payer: Self-pay | Admitting: Internal Medicine

## 2014-07-22 VITALS — BP 120/68 | HR 69 | Ht 70.0 in | Wt 141.0 lb

## 2014-07-22 DIAGNOSIS — I495 Sick sinus syndrome: Secondary | ICD-10-CM

## 2014-07-22 DIAGNOSIS — I4891 Unspecified atrial fibrillation: Secondary | ICD-10-CM

## 2014-07-22 LAB — MDC_IDC_ENUM_SESS_TYPE_INCLINIC
Battery Impedance: 161 Ohm
Battery Voltage: 2.78 V
Brady Statistic RV Percent Paced: 76 %
Lead Channel Impedance Value: 0 Ohm
Lead Channel Impedance Value: 479 Ohm
Lead Channel Pacing Threshold Amplitude: 0.5 V
Lead Channel Setting Pacing Amplitude: 2.5 V
Lead Channel Setting Pacing Pulse Width: 0.4 ms
Lead Channel Setting Sensing Sensitivity: 2.8 mV
MDC IDC MSMT BATTERY REMAINING LONGEVITY: 113 mo
MDC IDC MSMT LEADCHNL RV PACING THRESHOLD PULSEWIDTH: 0.4 ms
MDC IDC MSMT LEADCHNL RV SENSING INTR AMPL: 8 mV
MDC IDC SESS DTM: 20151221143110

## 2014-07-22 NOTE — Patient Instructions (Signed)
Your physician wants you to follow-up in: 6 months with Truitt Merle, NP and 12 months with Dr. Vallery Ridge will receive a reminder letter in the mail two months in advance. If you don't receive a letter, please call our office to schedule the follow-up appointment.  Remote monitoring is used to monitor your Pacemaker of ICD from home. This monitoring reduces the number of office visits required to check your device to one time per year. It allows [place] to keep an eye on the functioning of your device to ensure it is working properly. You are scheduled for a device check from home on 10/21/14. You may send your transmission at any time that day. If you have a wireless device, the transmission will be sent automatically. After your physician reviews your transmission, you will receive a postcard with your next transmission date.

## 2014-07-22 NOTE — Progress Notes (Signed)
PCP: Antonio Fraction, MD  [name] is a 80 y.o. male who presents today for routine electrophysiology followup.  Since his last visit, the patient reports doing reasonably well.  He has had problems with balance and is therefore not on anticoagulation.  He also had epistaxis requiring EF visits when on coumadin previously.  He is very active for his age.  He exercises several days per week without limitation.  Today, he denies symptoms of palpitations, chest pain, shortness of breath,  lower extremity edema, dizziness, presyncope, or syncope.  The patient is otherwise without complaint today.   Past Medical History  Diagnosis Date  . Hyperlipidemia   . Atrial fibrillation     permanent afib  . Long-term (current) use of anticoagulants     previously on coumadin, stopped by [name] due to frequent falls  . BRADYCARDIA-TACHYCARDIA SYNDROME 2007    s/p PPM by [name]   Past Surgical History  Procedure Laterality Date  . Abdominal surgery  1986  . Hernia repair    . Pacemaker insertion  01/28/06; 08-07-13    PPM  (MDT) implanted by [name] 2007; MDT [id number] gen change by [name] 08/2013  . Permanent pacemaker generator change N/A 08/07/2013    Procedure: PERMANENT PACEMAKER GENERATOR CHANGE;  Surgeon: Antonio Mark, MD;  Location: [place] [facility];  Service: Cardiovascular;  Laterality: N/A;    Current Outpatient Prescriptions  Medication Sig Dispense Refill  . acetaminophen (TYLENOL ARTHRITIS PAIN) 650 MG CR tablet Take 650 mg by mouth 2 (two) times daily.    Marland Kitchen aspirin 81 MG tablet Take 81 mg by mouth daily.    . digoxin (LANOXIN) 0.25 MG tablet TAKE 1 TABLET BY MOUTH EVERY DAY 30 tablet 3  . loteprednol (LOTEMAX) 0.5 % ophthalmic suspension Place 1 drop into both eyes daily.     . metoprolol succinate (TOPROL-XL) 25 MG 24 hr tablet TAKE 1 TABLET (25 MG TOTAL) BY MOUTH DAILY. TAKE WITH OR IMMEDIATELY FOLLOWING A MEAL. 90 tablet 0  . Omega-3 Fatty Acids (FISH OIL) 1000 MG  CAPS Take 1 capsule by mouth daily.    . polyethylene glycol (MIRALAX / GLYCOLAX) packet Take 17 g by mouth daily with breakfast.     . simvastatin (ZOCOR) 40 MG tablet TAKE 1 TABLET BY MOUTH AT BEDTIME 90 tablet 1  . tamsulosin (FLOMAX) 0.4 MG CAPS capsule TAKE 1 CAPSULE (0.4 MG TOTAL) BY MOUTH DAILY. 30 capsule 11   No current facility-administered medications for this visit.    Physical Exam: Filed Vitals:   07/22/14 1358  BP: 120/68  Pulse: 69  Height: 5\' 10"  (1.778 m)  Weight: 141 lb (63.957 kg)    GEN- The patient is well appearing, alert and oriented x 3 today.   Head- normocephalic, atraumatic Eyes-  Sclera clear, conjunctiva pink Ears- hearing intact Oropharynx- clear Lungs- Clear to ausculation bilaterally, normal work of breathing Chest- pacemaker pocket is well healed Heart- irregular rate and rhythm GI- soft, NT, ND, + BS Extremities- no clubbing, cyanosis, or edema  Pacemaker interrogation- reviewed in detail today,  See PACEART report  Assessment and Plan:  1. Symptomatic bradycardia Normal pacemaker function See Pace Art report No changes today  2. Permanent atrial fibrillation Not a candidate for anticoagulation due to frequent falls  Return to see [name] in 6 months I willl see again in 1 year  Antonio Grayer MD 07/22/2014 2:37 PM

## 2014-07-29 ENCOUNTER — Encounter: Payer: Self-pay | Admitting: Internal Medicine

## 2014-08-28 ENCOUNTER — Other Ambulatory Visit: Payer: Self-pay | Admitting: Internal Medicine

## 2014-09-14 ENCOUNTER — Other Ambulatory Visit: Payer: Self-pay | Admitting: Family Medicine

## 2014-10-21 ENCOUNTER — Telehealth: Payer: Self-pay | Admitting: Cardiology

## 2014-10-21 ENCOUNTER — Ambulatory Visit (INDEPENDENT_AMBULATORY_CARE_PROVIDER_SITE_OTHER): Payer: Medicare Other | Admitting: *Deleted

## 2014-10-21 ENCOUNTER — Encounter: Payer: Self-pay | Admitting: Internal Medicine

## 2014-10-21 DIAGNOSIS — I495 Sick sinus syndrome: Secondary | ICD-10-CM

## 2014-10-21 LAB — MDC_IDC_ENUM_SESS_TYPE_REMOTE
Battery Impedance: 210 Ohm
Battery Remaining Longevity: 100 mo
Battery Voltage: 2.77 V
Brady Statistic RV Percent Paced: 81 %
Date Time Interrogation Session: 20160321181217
Lead Channel Impedance Value: 0 Ohm
Lead Channel Impedance Value: 483 Ohm
Lead Channel Pacing Threshold Amplitude: 0.625 V
Lead Channel Pacing Threshold Pulse Width: 0.4 ms
Lead Channel Setting Sensing Sensitivity: 2 mV
MDC IDC SET LEADCHNL RV PACING AMPLITUDE: 2.5 V
MDC IDC SET LEADCHNL RV PACING PULSEWIDTH: 0.4 ms

## 2014-10-21 NOTE — Telephone Encounter (Signed)
LMOVM reminding pt to send remote transmission.   

## 2014-10-22 NOTE — Progress Notes (Signed)
Remote pacemaker transmission.   

## 2014-10-30 ENCOUNTER — Telehealth: Payer: Self-pay | Admitting: Family Medicine

## 2014-10-30 NOTE — Telephone Encounter (Signed)
PA submitted through [email or web address] for Digoxin

## 2014-11-01 NOTE — Telephone Encounter (Signed)
Using for heart rate control for a fib.  Can we get PA.

## 2014-11-01 NOTE — Telephone Encounter (Signed)
Daughter calling states rec'd denial for Digoxin and what is patient suppose to do now?

## 2014-11-07 ENCOUNTER — Encounter: Payer: Self-pay | Admitting: *Deleted

## 2014-11-07 ENCOUNTER — Encounter: Payer: Self-pay | Admitting: Cardiology

## 2014-11-07 NOTE — Telephone Encounter (Signed)
Received PA determination.   PA denied.   Appeal letter sent.

## 2014-11-10 ENCOUNTER — Encounter: Payer: Self-pay | Admitting: Family Medicine

## 2014-11-12 NOTE — Telephone Encounter (Signed)
PA has been approved through an appeal and is valid from 10/30/14-11/08/15

## 2014-11-12 NOTE — Telephone Encounter (Signed)
Pt's daughter aware of approval

## 2014-11-13 ENCOUNTER — Encounter: Payer: Self-pay | Admitting: Family Medicine

## 2014-11-19 ENCOUNTER — Ambulatory Visit (INDEPENDENT_AMBULATORY_CARE_PROVIDER_SITE_OTHER): Payer: Medicare Other | Admitting: Family Medicine

## 2014-11-19 ENCOUNTER — Encounter: Payer: Self-pay | Admitting: Family Medicine

## 2014-11-19 VITALS — BP 136/60 | HR 78 | Temp 98.0°F | Resp 14 | Ht 70.0 in | Wt 141.0 lb

## 2014-11-19 DIAGNOSIS — M545 Low back pain, unspecified: Secondary | ICD-10-CM

## 2014-11-19 DIAGNOSIS — D485 Neoplasm of uncertain behavior of skin: Secondary | ICD-10-CM

## 2014-11-19 NOTE — Progress Notes (Signed)
Subjective:    Patient ID: [name], male    DOB: [birth year], 81 y.o.   MRN: [id number]  HPI  Patient has chronic low back pain due to degenerative disc disease as well as arthritis in his lower back. We are treating this symptomatically with Tylenol arthritis. Recently the pain in his back has worsened. Severe pain in his right posterior hip. This is actually located approximately at the [facility] joint. He is tender to palpation in this area on exam. He denies any falls or injuries. He states that this is been worsening over the last few weeks. He denies any neuropathy in his legs or weakness in his legs. There is a coincidental finding on his examination today. There is a 5 mm to 6 mm lesion underneath his left eye. It is an erythematous scaly papule with central ulceration. It appears to be a basal cell carcinoma. Past Medical History  Diagnosis Date  . Hyperlipidemia   . Atrial fibrillation     permanent afib  . Long-term (current) use of anticoagulants     previously on coumadin, stopped by Dr [name] due to frequent falls  . BRADYCARDIA-TACHYCARDIA SYNDROME 2007    s/p PPM by Dr [name]   Past Surgical History  Procedure Laterality Date  . Abdominal surgery  1986  . Hernia repair    . Pacemaker insertion  01/28/06; 08-07-13    PPM  (MDT) implanted by Dr [name] 2007; MDT [id number] gen change by Dr Rayann Heman 08/2013  . Permanent pacemaker generator change N/A 08/07/2013    Procedure: PERMANENT PACEMAKER GENERATOR CHANGE;  Surgeon: Coralyn Mark, MD;  Location: [place] CATH [facility];  Service: Cardiovascular;  Laterality: N/A;   Current Outpatient Prescriptions on File Prior to Visit  Medication Sig Dispense Refill  . acetaminophen (TYLENOL ARTHRITIS PAIN) 650 MG CR tablet Take 650 mg by mouth 2 (two) times daily.    Marland Kitchen aspirin 81 MG tablet Take 81 mg by mouth daily.    . digoxin (LANOXIN) 0.25 MG tablet TAKE 1 TABLET BY MOUTH EVERY DAY 30 tablet 3  . loteprednol (LOTEMAX) 0.5 % ophthalmic  suspension Place 1 drop into both eyes daily.     . metoprolol succinate (TOPROL-XL) 25 MG 24 hr tablet TAKE 1 TABLET (25 MG TOTAL) BY MOUTH DAILY. TAKE WITH OR IMMEDIATELY FOLLOWING A MEAL. 90 tablet 1  . Omega-3 Fatty Acids (FISH OIL) 1000 MG CAPS Take 1 capsule by mouth daily.    . polyethylene glycol (MIRALAX / GLYCOLAX) packet Take 17 g by mouth daily with breakfast.     . simvastatin (ZOCOR) 40 MG tablet TAKE 1 TABLET BY MOUTH AT BEDTIME 90 tablet 1  . tamsulosin (FLOMAX) 0.4 MG CAPS capsule TAKE 1 CAPSULE (0.4 MG TOTAL) BY MOUTH DAILY. 30 capsule 11   No current facility-administered medications on file prior to visit.   No Known Allergies History   Social History  . Marital Status: Married    Spouse Name: N/A  . Number of Children: N/A  . Years of Education: N/A   Occupational History  . Not on file.   Social History Main Topics  . Smoking status: Former Smoker -- 1.00 packs/day for 25 years    Types: Cigarettes    Quit date: 08/02/1961  . Smokeless tobacco: Never Used  . Alcohol Use: No  . Drug Use: No  . Sexual Activity: Not on file   Other Topics Concern  . Not on file   Social History Narrative  Review of Systems  All other systems reviewed and are negative.      Objective:   Physical Exam  Cardiovascular: Normal rate.   Pulmonary/Chest: Effort normal and breath sounds normal.  Musculoskeletal:       Lumbar back: He exhibits decreased range of motion, tenderness, bony tenderness and pain.  Vitals reviewed.         Assessment & Plan:  Midline low back pain without sciatica - Plan: DG Pelvis 1-2 Views, DG Lumbar Spine Complete Midline low back pain without sciatica - Plan: DG Pelvis 1-2 Views, DG Lumbar Spine Complete  Neoplasm of uncertain behavior of skin - Plan: Pathology   given the pain located over the sacrum, I will obtain an x-ray of the pelvis as well as the lumbar spine to rule out skeletal pathology such as a skeletal neoplasm.  If  the x-ray is normal, I will treat the patient symptomatically given his age.  The lesion on his left cheek appears to be a basal cell carcinoma. I anesthetized the lesion was 0.1% lidocaine with epinephrine. Using sterile technique, I performed a shave biopsy. Hemostasis was achieved with Drysol and a Band-Aid. The lesion was sent to pathology in a labeled container

## 2014-11-21 ENCOUNTER — Ambulatory Visit
Admission: RE | Admit: 2014-11-21 | Discharge: 2014-11-21 | Disposition: A | Discharge status: Home / Self Care | Payer: Medicare Other | Source: Ambulatory Visit | Attending: Family Medicine | Admitting: Family Medicine

## 2014-11-21 DIAGNOSIS — M545 Low back pain, unspecified: Secondary | ICD-10-CM

## 2014-11-21 LAB — PATHOLOGY

## 2014-11-22 ENCOUNTER — Other Ambulatory Visit: Payer: Self-pay | Admitting: Family Medicine

## 2014-11-22 DIAGNOSIS — C4491 Basal cell carcinoma of skin, unspecified: Secondary | ICD-10-CM

## 2014-11-25 ENCOUNTER — Encounter: Payer: Self-pay | Admitting: Family Medicine

## 2014-12-11 ENCOUNTER — Encounter: Payer: Self-pay | Admitting: *Deleted

## 2014-12-15 ENCOUNTER — Emergency Department (HOSPITAL_COMMUNITY): Payer: Medicare Other

## 2014-12-15 ENCOUNTER — Emergency Department (HOSPITAL_COMMUNITY)
Admission: EM | Admit: 2014-12-15 | Discharge: 2014-12-15 | Disposition: A | Discharge status: Home / Self Care | Payer: Medicare Other | Attending: Emergency Medicine | Admitting: Emergency Medicine

## 2014-12-15 ENCOUNTER — Other Ambulatory Visit: Payer: Self-pay

## 2014-12-15 ENCOUNTER — Encounter (HOSPITAL_COMMUNITY): Payer: Self-pay | Admitting: *Deleted

## 2014-12-15 DIAGNOSIS — S0990XA Unspecified injury of head, initial encounter: Secondary | ICD-10-CM | POA: Insufficient documentation

## 2014-12-15 DIAGNOSIS — Z79899 Other long term (current) drug therapy: Secondary | ICD-10-CM | POA: Insufficient documentation

## 2014-12-15 DIAGNOSIS — Z792 Long term (current) use of antibiotics: Secondary | ICD-10-CM | POA: Insufficient documentation

## 2014-12-15 DIAGNOSIS — I4891 Unspecified atrial fibrillation: Secondary | ICD-10-CM | POA: Diagnosis not present

## 2014-12-15 DIAGNOSIS — Z950 Presence of cardiac pacemaker: Secondary | ICD-10-CM | POA: Diagnosis not present

## 2014-12-15 DIAGNOSIS — Z87891 Personal history of nicotine dependence: Secondary | ICD-10-CM | POA: Diagnosis not present

## 2014-12-15 DIAGNOSIS — Y92002 Bathroom of unspecified non-institutional (private) residence single-family (private) house as the place of occurrence of the external cause: Secondary | ICD-10-CM | POA: Diagnosis not present

## 2014-12-15 DIAGNOSIS — Y9389 Activity, other specified: Secondary | ICD-10-CM | POA: Insufficient documentation

## 2014-12-15 DIAGNOSIS — S199XXA Unspecified injury of neck, initial encounter: Secondary | ICD-10-CM | POA: Diagnosis present

## 2014-12-15 DIAGNOSIS — Y998 Other external cause status: Secondary | ICD-10-CM | POA: Diagnosis not present

## 2014-12-15 DIAGNOSIS — W01198A Fall on same level from slipping, tripping and stumbling with subsequent striking against other object, initial encounter: Secondary | ICD-10-CM | POA: Insufficient documentation

## 2014-12-15 DIAGNOSIS — S161XXA Strain of muscle, fascia and tendon at neck level, initial encounter: Secondary | ICD-10-CM | POA: Insufficient documentation

## 2014-12-15 DIAGNOSIS — M542 Cervicalgia: Secondary | ICD-10-CM

## 2014-12-15 DIAGNOSIS — E785 Hyperlipidemia, unspecified: Secondary | ICD-10-CM | POA: Insufficient documentation

## 2014-12-15 DIAGNOSIS — Z7982 Long term (current) use of aspirin: Secondary | ICD-10-CM | POA: Insufficient documentation

## 2014-12-15 DIAGNOSIS — W19XXXA Unspecified fall, initial encounter: Secondary | ICD-10-CM

## 2014-12-15 LAB — CBC WITH DIFFERENTIAL/PLATELET
Basophils Absolute: 0 10*3/uL (ref 0.0–0.1)
Basophils Relative: 0 % (ref 0–1)
Eosinophils Absolute: 0.1 10*3/uL (ref 0.0–0.7)
Eosinophils Relative: 3 % (ref 0–5)
HCT: 38.9 % — ABNORMAL LOW (ref 39.0–52.0)
Hemoglobin: 12.4 g/dL — ABNORMAL LOW (ref 13.0–17.0)
Lymphocytes Relative: 21 % (ref 12–46)
Lymphs Abs: 1 10*3/uL (ref 0.7–4.0)
MCH: 31.8 pg (ref 26.0–34.0)
MCHC: 31.9 g/dL (ref 30.0–36.0)
MCV: 99.7 fL (ref 78.0–100.0)
Monocytes Absolute: 0.3 10*3/uL (ref 0.1–1.0)
Monocytes Relative: 6 % (ref 3–12)
Neutro Abs: 3.4 10*3/uL (ref 1.7–7.7)
Neutrophils Relative %: 70 % (ref 43–77)
Platelets: 144 10*3/uL — ABNORMAL LOW (ref 150–400)
RBC: 3.9 MIL/uL — ABNORMAL LOW (ref 4.22–5.81)
RDW: 13.1 % (ref 11.5–15.5)
WBC: 4.9 10*3/uL (ref 4.0–10.5)

## 2014-12-15 LAB — COMPREHENSIVE METABOLIC PANEL
ALT: 11 U/L — ABNORMAL LOW (ref 17–63)
AST: 22 U/L (ref 15–41)
Albumin: 4 g/dL (ref 3.5–5.0)
Alkaline Phosphatase: 48 U/L (ref 38–126)
Anion gap: 5 (ref 5–15)
BUN: 20 mg/dL (ref 6–20)
CO2: 31 mmol/L (ref 22–32)
Calcium: 9.1 mg/dL (ref 8.9–10.3)
Chloride: 104 mmol/L (ref 101–111)
Creatinine, Ser: 1.41 mg/dL — ABNORMAL HIGH (ref 0.61–1.24)
GFR calc Af Amer: 49 mL/min — ABNORMAL LOW (ref >60)
GFR calc non Af Amer: 42 mL/min — ABNORMAL LOW (ref >60)
Glucose, Bld: 96 mg/dL (ref 65–99)
Potassium: 4.1 mmol/L (ref 3.5–5.1)
Sodium: 140 mmol/L (ref 135–145)
Total Bilirubin: 0.6 mg/dL (ref 0.3–1.2)
Total Protein: 6.8 g/dL (ref 6.5–8.1)

## 2014-12-15 LAB — I-STAT TROPONIN, ED: Troponin i, poc: 0.01 ng/mL (ref 0.00–0.08)

## 2014-12-15 NOTE — ED Notes (Signed)
Pt reports falling in his bathroom this morning. Pt states he doesn't know why or how he fell. Pt's head struck the bathroom floor. Pt presents with a hematoma to the posterior side of his head. Pt also c/o right hip pain. Pt denies LOC. Pt does not take blood thinners.

## 2014-12-15 NOTE — ED Provider Notes (Signed)
CSN: [id number]     Arrival date & time 12/15/14  1523 History   First MD Initiated Contact with Patient 12/15/14 1557     Chief Complaint  Patient presents with  . Fall  . Headache  . Hip Pain     (Consider location/radiation/quality/duration/timing/severity/associated sxs/prior Treatment) HPI Comments: Pt comes in with cc of fall. Pt had a mechanical fall at 8 am, as he lost his balance. Pt fell backwards and hit his head. He was helped up by his kids, who live nearby. Around noon pt mentioned that he just doesn't feel well and that he had a mild head and neck pain and rt hp pain. He has ambulated since the fall. No n/v/seizures/ams/loc.  Patient is a 81 y.o. male presenting with fall, headaches, and hip pain. The history is provided by the patient and a relative.  Fall Associated symptoms include headaches.  Headache Hip Pain Associated symptoms include headaches.    Past Medical History  Diagnosis Date  . Hyperlipidemia   . Atrial fibrillation     permanent afib  . Long-term (current) use of anticoagulants     previously on coumadin, stopped by Dr [name] due to frequent falls  . BRADYCARDIA-TACHYCARDIA SYNDROME 2007    s/p PPM by Dr [name]   Past Surgical History  Procedure Laterality Date  . Abdominal surgery  1986  . Hernia repair    . Pacemaker insertion  01/28/06; 08-07-13    PPM  (MDT) implanted by Dr [name] 2007; MDT [id number] gen change by Dr Rayann Heman 08/2013  . Permanent pacemaker generator change N/A 08/07/2013    Procedure: PERMANENT PACEMAKER GENERATOR CHANGE;  Surgeon: Coralyn Mark, MD;  Location: [place] C[facility];  Service: Cardiovascular;  Laterality: N/A;   Family History  Problem Relation Age of Onset  . Heart attack Father    History  Substance Use Topics  . Smoking status: Former Smoker -- 1.00 packs/day for 25 years    Types: Cigarettes    Quit date: 08/02/1961  . Smokeless tobacco: Never Used  . Alcohol Use: No    Review of Systems  Neurological:  Positive for headaches.  All other systems reviewed and are negative.     Allergies  Review of patient's allergies indicates no known allergies.  Home Medications   Prior to Admission medications   Medication Sig Start Date End Date Taking? Authorizing Provider  acetaminophen (TYLENOL) 325 MG tablet Take 650 mg by mouth See admin instructions. Take 2 tablets (650 mg) 2 times daily, may take 2 more tablets mid-day as needed for pain   Yes Historical Provider, MD  aspirin EC 81 MG tablet Take 81 mg by mouth daily.   Yes Historical Provider, MD  digoxin (LANOXIN) 0.25 MG tablet TAKE 1 TABLET BY MOUTH EVERY DAY 09/17/14  Yes Susy Frizzle, MD  loteprednol (LOTEMAX) 0.5 % ophthalmic suspension Place 1 drop into both eyes daily as needed (itching eyes).    Yes Historical Provider, MD  Menthol, Topical Analgesic, (ICY HOT EX) Apply 1 application topically daily as needed (pain).   Yes Historical Provider, MD  metoprolol succinate (TOPROL-XL) 25 MG 24 hr tablet TAKE 1 TABLET (25 MG TOTAL) BY MOUTH DAILY. TAKE WITH OR IMMEDIATELY FOLLOWING A MEAL. 08/28/14  Yes [name], MD  polyethylene glycol (MIRALAX / GLYCOLAX) packet Take 17 g by mouth daily as needed (constipation).    Yes Historical Provider, MD  simvastatin (ZOCOR) 40 MG tablet TAKE 1 TABLET BY MOUTH AT BEDTIME 07/17/14  Yes Susy Frizzle, MD  tamsulosin (FLOMAX) 0.4 MG CAPS capsule TAKE 1 CAPSULE (0.4 MG TOTAL) BY MOUTH DAILY. 04/10/14  Yes Susy Frizzle, MD  ciprofloxacin (CIPRO) 500 MG tablet Take 1 tablet (500 mg total) by mouth 2 (two) times daily. 12/24/14   Susy Frizzle, MD   BP 140/111 mmHg  Pulse 63  Temp(Src) 97.7 F (36.5 C) (Oral)  Resp 15  Ht 5\' 9"  (1.753 m)  Wt 143 lb (64.864 kg)  BMI 21.11 kg/m2  SpO2 100% Physical Exam  Constitutional: He is oriented to person, place, and time. He appears well-developed.  HENT:  Head: Normocephalic and atraumatic.  Eyes: Conjunctivae and EOM are normal. Pupils are  equal, round, and reactive to light.  Neck:  Midline cspine tenderness  Cardiovascular: Normal rate and regular rhythm.   Pulmonary/Chest: Effort normal and breath sounds normal.  Abdominal: Soft. Bowel sounds are normal. He exhibits no distension. There is no tenderness. There is no rebound and no guarding.  Neurological: He is alert and oriented to person, place, and time. Coordination normal.  Skin: Skin is warm.  Nursing note and vitals reviewed.   ED Course  Procedures (including critical care time) Labs Review Labs Reviewed  COMPREHENSIVE METABOLIC PANEL - Abnormal; Notable for the following:    Creatinine, Ser 1.41 (*)    ALT 11 (*)    GFR calc non Af Amer 42 (*)    GFR calc Af Amer 49 (*)    All other components within normal limits  CBC WITH DIFFERENTIAL/PLATELET - Abnormal; Notable for the following:    RBC 3.90 (*)    Hemoglobin 12.4 (*)    HCT 38.9 (*)    Platelets 144 (*)    All other components within normal limits  I-STAT TROPOININ, ED    Imaging Review No results found.   EKG Interpretation   Date/Time:  Sunday Dec 15 2014 15:46:50 EDT Ventricular Rate:  67 PR Interval:    QRS Duration: 88 QT Interval:  360 QTC Calculation: 380 R Axis:   74 Text Interpretation:  Atrial fibrillation with frequent ventricular-paced  complexes Abnormal ECG ED PHYSICIAN INTERPRETATION AVAILABLE IN [facility] Confirmed by TEST, Record ([id number]) on 12/17/2014 7:58:54 AM      MDM   Final diagnoses:  Fall  Neck pain  Cervical strain, acute, initial encounter    DDx includes: - Mechanical falls - ICH - Fractures - Contusions - Soft tissue injury  PT with fall. Now has headache and also neck pain. No focal neuro deficit. Appropriate imaging ordered, and there is no evidence of fractures or bleed. Pt has persistent midline cspine tenderness, so we will place a soft collar and discharge. Strict return precautions discussed for cervical instability and advised  to see pcp if the pain persists 1 week later.  Varney Biles, MD 12/24/14 1751

## 2014-12-15 NOTE — Discharge Instructions (Signed)
We saw you in the ER after the fall. Our imaging is normal in the ER. We think you are having a cervical sprain/spasms, however, to be absolutely sure we are not missing a significant ligament injury - we are sending you home with a cervical collar. Keep the collar on until the pain ceases, at which point you can take the collar off. If the symptoms get worse, you start having numbness, tingling, weakness in your arms or hands, return to the ER right away. If the symptoms don't improve in 1 week, call the Primary doctor for an appointment.   Cervical Sprain A cervical sprain is an injury in the neck in which the strong, fibrous tissues (ligaments) that connect your neck bones stretch or tear. Cervical sprains can range from mild to severe. Severe cervical sprains can cause the neck vertebrae to be unstable. This can lead to damage of the spinal cord and can result in serious nervous system problems. The amount of time it takes for a cervical sprain to get better depends on the cause and extent of the injury. Most cervical sprains heal in 1 to 3 weeks. CAUSES  Severe cervical sprains may be caused by:   Contact sport injuries (such as from football, rugby, wrestling, hockey, auto racing, gymnastics, diving, martial arts, or boxing).   Motor vehicle collisions.   Whiplash injuries. This is an injury from a sudden forward and backward whipping movement of the head and neck.  Falls.  Mild cervical sprains may be caused by:   Being in an awkward position, such as while cradling a telephone between your ear and shoulder.   Sitting in a chair that does not offer proper support.   Working at a poorly Landscape architect station.   Looking up or down for long periods of time.  SYMPTOMS   Pain, soreness, stiffness, or a burning sensation in the front, back, or sides of the neck. This discomfort may develop immediately after the injury or slowly, 24 hours or more after the injury.   Pain  or tenderness directly in the middle of the back of the neck.   Shoulder or upper back pain.   Limited ability to move the neck.   Headache.   Dizziness.   Weakness, numbness, or tingling in the hands or arms.   Muscle spasms.   Difficulty swallowing or chewing.   Tenderness and swelling of the neck.  DIAGNOSIS  Most of the time your health care provider can diagnose a cervical sprain by taking your history and doing a physical exam. Your health care provider will ask about previous neck injuries and any known neck problems, such as arthritis in the neck. X-rays may be taken to find out if there are any other problems, such as with the bones of the neck. Other tests, such as a CT scan or MRI, may also be needed.  TREATMENT  Treatment depends on the severity of the cervical sprain. Mild sprains can be treated with rest, keeping the neck in place (immobilization), and pain medicines. Severe cervical sprains are immediately immobilized. Further treatment is done to help with pain, muscle spasms, and other symptoms and may include:  Medicines, such as pain relievers, numbing medicines, or muscle relaxants.   Physical therapy. This may involve stretching exercises, strengthening exercises, and posture training. Exercises and improved posture can help stabilize the neck, strengthen muscles, and help stop symptoms from returning.  HOME CARE INSTRUCTIONS   Put ice on the injured area.  Put ice in a plastic bag.   Place a towel between your skin and the bag.   Leave the ice on for 15-20 minutes, 3-4 times a day.   If your injury was severe, you may have been given a cervical collar to wear. A cervical collar is a two-piece collar designed to keep your neck from moving while it heals.  Do not remove the collar unless instructed by your health care provider.  If you have long hair, keep it outside of the collar.  Ask your health care provider before making any  adjustments to your collar. Minor adjustments may be required over time to improve comfort and reduce pressure on your chin or on the back of your head.  Ifyou are allowed to remove the collar for cleaning or bathing, follow your health care provider's instructions on how to do so safely.  Keep your collar clean by wiping it with mild soap and water and drying it completely. If the collar you have been given includes removable pads, remove them every 1-2 days and hand wash them with soap and water. Allow them to air dry. They should be completely dry before you wear them in the collar.  If you are allowed to remove the collar for cleaning and bathing, wash and dry the skin of your neck. Check your skin for irritation or sores. If you see any, tell your health care provider.  Do not drive while wearing the collar.   Only take over-the-counter or prescription medicines for pain, discomfort, or fever as directed by your health care provider.   Keep all follow-up appointments as directed by your health care provider.   Keep all physical therapy appointments as directed by your health care provider.   Make any needed adjustments to your workstation to promote good posture.   Avoid positions and activities that make your symptoms worse.   Warm up and stretch before being active to help prevent problems.  SEEK MEDICAL CARE IF:   Your pain is not controlled with medicine.   You are unable to decrease your pain medicine over time as planned.   Your activity level is not improving as expected.  SEEK IMMEDIATE MEDICAL CARE IF:   You develop any bleeding.  You develop stomach upset.  You have signs of an allergic reaction to your medicine.   Your symptoms get worse.   You develop new, unexplained symptoms.   You have numbness, tingling, weakness, or paralysis in any part of your body.  MAKE SURE YOU:   Understand these instructions.  Will watch your condition.  Will  get help right away if you are not doing well or get worse. Document Released: 05/16/2007 Document Revised: 07/24/2013 Document Reviewed: 01/24/2013 [facility] Patient Information 2015 [place], [place]. This information is not intended to replace advice given to you by your health care provider. Make sure you discuss any questions you have with your health care provider. Contusion A contusion is a deep bruise. Contusions are the result of an injury that caused bleeding under the skin. The contusion may turn blue, purple, or yellow. Minor injuries will give you a painless contusion, but more severe contusions may stay painful and swollen for a few weeks.  CAUSES  A contusion is usually caused by a blow, trauma, or direct force to an area of the body. SYMPTOMS   Swelling and redness of the injured area.  Bruising of the injured area.  Tenderness and soreness of the injured area.  Pain.  DIAGNOSIS  The diagnosis can be made by taking a history and physical exam. An X-ray, CT scan, or MRI may be needed to determine if there were any associated injuries, such as fractures. TREATMENT  Specific treatment will depend on what area of the body was injured. In general, the best treatment for a contusion is resting, icing, elevating, and applying cold compresses to the injured area. Over-the-counter medicines may also be recommended for pain control. Ask your caregiver what the best treatment is for your contusion. HOME CARE INSTRUCTIONS   Put ice on the injured area.  Put ice in a plastic bag.  Place a towel between your skin and the bag.  Leave the ice on for 15-20 minutes, 3-4 times a day, or as directed by your health care provider.  Only take over-the-counter or prescription medicines for pain, discomfort, or fever as directed by your caregiver. Your caregiver may recommend avoiding anti-inflammatory medicines (aspirin, ibuprofen, and naproxen) for 48 hours because these medicines may increase  bruising.  Rest the injured area.  If possible, elevate the injured area to reduce swelling. SEEK IMMEDIATE MEDICAL CARE IF:   You have increased bruising or swelling.  You have pain that is getting worse.  Your swelling or pain is not relieved with medicines. MAKE SURE YOU:   Understand these instructions.  Will watch your condition.  Will get help right away if you are not doing well or get worse. Document Released: 04/28/2005 Document Revised: 07/24/2013 Document Reviewed: 05/24/2011 [facility] Patient Information 2015 [place], [place]. This information is not intended to replace advice given to you by your health care provider. Make sure you discuss any questions you have with your health care provider.

## 2014-12-15 NOTE — ED Notes (Addendum)
Pt unable to ambulate in [name]. Pt complains of dizziness

## 2014-12-15 NOTE — ED Notes (Signed)
Off unit with CT 

## 2014-12-15 NOTE — ED Notes (Signed)
Pt off unit with Xray 

## 2014-12-15 NOTE — ED Notes (Signed)
Pt returned from CT °

## 2014-12-24 ENCOUNTER — Ambulatory Visit (INDEPENDENT_AMBULATORY_CARE_PROVIDER_SITE_OTHER): Payer: Medicare Other | Admitting: Family Medicine

## 2014-12-24 ENCOUNTER — Encounter: Payer: Self-pay | Admitting: Family Medicine

## 2014-12-24 VITALS — BP 90/58 | HR 66 | Temp 97.7°F | Resp 20 | Ht 70.0 in

## 2014-12-24 DIAGNOSIS — R350 Frequency of micturition: Secondary | ICD-10-CM | POA: Diagnosis not present

## 2014-12-24 DIAGNOSIS — N410 Acute prostatitis: Secondary | ICD-10-CM | POA: Diagnosis not present

## 2014-12-24 LAB — URINALYSIS, ROUTINE W REFLEX MICROSCOPIC
Bilirubin Urine: NEGATIVE
Glucose, UA: NEGATIVE mg/dL
KETONES UR: NEGATIVE mg/dL
Nitrite: POSITIVE — AB
PH: 5.5 (ref 5.0–8.0)
Protein, ur: 30 mg/dL — AB
Urobilinogen, UA: 0.2 mg/dL (ref 0.0–1.0)

## 2014-12-24 LAB — URINALYSIS, MICROSCOPIC ONLY
CASTS: NONE SEEN
Crystals: NONE SEEN

## 2014-12-24 MED ORDER — CIPROFLOXACIN HCL 500 MG PO TABS: 500.0000 mg | ORAL_TABLET | Freq: Two times a day (BID) | ORAL | Status: DC

## 2014-12-24 NOTE — Progress Notes (Signed)
Subjective:    Patient ID: [name], male    DOB: April 07, 1923, 81 y.o.   MRN: [id number]  HPI  Patient reports 2 weeks of increasing urinary frequency. He reports a episodes of nocturia per night. He reports burning with urination, frequency, urgency, hesitancy, and weak urinary stream. Daughter states that he has been slightly more confused weak and dizzy over the last few days.  Past Medical History  Diagnosis Date  . Hyperlipidemia   . Atrial fibrillation     permanent afib  . Long-term (current) use of anticoagulants     previously on coumadin, stopped by Dr [name] due to frequent falls  . BRADYCARDIA-TACHYCARDIA SYNDROME 2007    s/p PPM by Dr [name]   Past Surgical History  Procedure Laterality Date  . Abdominal surgery  1986  . Hernia repair    . Pacemaker insertion  01/28/06; 08-07-13    PPM  (MDT) implanted by Dr [name] 2007; MDT [id number] gen change by Dr Rayann Heman 08/2013  . Permanent pacemaker generator change N/A 08/07/2013    Procedure: PERMANENT PACEMAKER GENERATOR CHANGE;  Surgeon: Coralyn Mark, MD;  Location: [place] CATH LAB;  Service: Cardiovascular;  Laterality: N/A;   Current Outpatient Prescriptions on File Prior to Visit  Medication Sig Dispense Refill  . acetaminophen (TYLENOL) 325 MG tablet Take 650 mg by mouth See admin instructions. Take 2 tablets (650 mg) 2 times daily, may take 2 more tablets mid-day as needed for pain    . aspirin EC 81 MG tablet Take 81 mg by mouth daily.    . digoxin (LANOXIN) 0.25 MG tablet TAKE 1 TABLET BY MOUTH EVERY DAY 30 tablet 3  . loteprednol (LOTEMAX) 0.5 % ophthalmic suspension Place 1 drop into both eyes daily as needed (itching eyes).     . Menthol, Topical Analgesic, (ICY HOT EX) Apply 1 application topically daily as needed (pain).    . metoprolol succinate (TOPROL-XL) 25 MG 24 hr tablet TAKE 1 TABLET (25 MG TOTAL) BY MOUTH DAILY. TAKE WITH OR IMMEDIATELY FOLLOWING A MEAL. 90 tablet 1  . polyethylene glycol (MIRALAX /  GLYCOLAX) packet Take 17 g by mouth daily as needed (constipation).     . simvastatin (ZOCOR) 40 MG tablet TAKE 1 TABLET BY MOUTH AT BEDTIME 90 tablet 1  . tamsulosin (FLOMAX) 0.4 MG CAPS capsule TAKE 1 CAPSULE (0.4 MG TOTAL) BY MOUTH DAILY. 30 capsule 11   No current facility-administered medications on file prior to visit.   No Known Allergies History   Social History  . Marital Status: Married    Spouse Name: N/A  . Number of Children: N/A  . Years of Education: N/A   Occupational History  . Not on file.   Social History Main Topics  . Smoking status: Former Smoker -- 1.00 packs/day for 25 years    Types: Cigarettes    Quit date: 08/02/1961  . Smokeless tobacco: Never Used  . Alcohol Use: No  . Drug Use: No  . Sexual Activity: Not on file   Other Topics Concern  . Not on file   Social History Narrative     Review of Systems  All other systems reviewed and are negative.      Objective:   Physical Exam  Constitutional: He appears well-developed and well-nourished.  Cardiovascular: Normal rate and normal heart sounds.  An irregularly irregular rhythm present.  Pulmonary/Chest: Effort normal and breath sounds normal.  Abdominal: Soft. Bowel sounds are normal.  Genitourinary: Prostate is  enlarged and tender.  Vitals reviewed.         Assessment & Plan:  Frequent urination - Plan: Urinalysis, Routine w reflex microscopic, Urine culture  Acute prostatitis - Plan: ciprofloxacin (CIPRO) 500 MG tablet  Patient's urinalysis confirms an infection. I will send a urine culture off. Meanwhile I'll start the patient on Cipro 500 mg by mouth twice a day for 14 days. Recheck in 2 weeks. Change antibody depending upon culture results if necessary. Consider treating for a full 4 weeks if symptoms persist. Recheck immediately if worse

## 2014-12-25 ENCOUNTER — Encounter: Payer: Self-pay | Admitting: Family Medicine

## 2014-12-27 ENCOUNTER — Encounter: Payer: Self-pay | Admitting: Family Medicine

## 2014-12-27 LAB — URINE CULTURE

## 2015-01-01 ENCOUNTER — Encounter: Payer: Self-pay | Admitting: Family Medicine

## 2015-01-07 ENCOUNTER — Encounter: Payer: Self-pay | Admitting: Family Medicine

## 2015-01-07 ENCOUNTER — Ambulatory Visit (INDEPENDENT_AMBULATORY_CARE_PROVIDER_SITE_OTHER): Payer: Medicare Other | Admitting: Family Medicine

## 2015-01-07 VITALS — BP 128/70 | HR 76 | Temp 97.8°F | Resp 16 | Ht 70.0 in | Wt 140.0 lb

## 2015-01-07 DIAGNOSIS — M25512 Pain in left shoulder: Secondary | ICD-10-CM

## 2015-01-07 DIAGNOSIS — N390 Urinary tract infection, site not specified: Secondary | ICD-10-CM | POA: Diagnosis not present

## 2015-01-07 LAB — URINALYSIS, ROUTINE W REFLEX MICROSCOPIC
Bilirubin Urine: NEGATIVE
GLUCOSE, UA: NEGATIVE mg/dL
Hgb urine dipstick: NEGATIVE
Ketones, ur: NEGATIVE mg/dL
Leukocytes, UA: NEGATIVE
Nitrite: NEGATIVE
Protein, ur: NEGATIVE mg/dL
Specific Gravity, Urine: 1.025 (ref 1.005–1.030)
UROBILINOGEN UA: 0.2 mg/dL (ref 0.0–1.0)
pH: 5.5 (ref 5.0–8.0)

## 2015-01-07 NOTE — Progress Notes (Signed)
Subjective:    Patient ID: [name], male    DOB: 1922/08/08, 81 y.o.   MRN: [id number]  HPI  12/24/14 Patient reports 2 weeks of increasing urinary frequency. He reports a episodes of nocturia per night. He reports burning with urination, frequency, urgency, hesitancy, and weak urinary stream. Daughter states that he has been slightly more confused weak and dizzy over the last few days.  At that time, my plan was: Patient's urinalysis confirms an infection. I will send a urine culture off. Meanwhile I'll start the patient on Cipro 500 mg by mouth twice a day for 14 days. Recheck in 2 weeks. Change antibody depending upon culture results if necessary. Consider treating for a full 4 weeks if symptoms persist. Recheck immediately if worse 01/07/15 He is here today for follow-up. His son also wants the patient to have a note stating that he is mentally competent to make decisions such that he can change his will.    The patient's lawyer requires this letter.  The patient's daughter is his primary caregiver.  Both the son and the daughter are here today. Regarding the patient's previous urinary tract infection, he states that his nocturia has decreased from 8-9 episodes night to 3-4 episodes per night. He denies any fevers or chills. He denies any nausea or vomiting. He denies any flulike symptoms. Patient's voiding patterns have returned to normal. However Saturday the patient fell and struck his left shoulder against a brick wall. He is now tender to palpation of the distal clavicle and as well as the acromial process. Patient has some mild pain with internal Rotation but has significant pain with abduction of shoulder greater than 90. I believe the patient has traumatic bursitis in his shoulder however I cannot exclude a fracture. I then asked the son and the daughter to leave the room. I spoke privately with the patient. Patient states that he would like to have his will remain the same. He feels  like he is pressured into changing his will. When he previously) will, his son was having repeated legal issues and jail time.  Son has now changed drastically but now he is wanting to change the wheel so that there is an echo bubble distribution of the patient's assets. Patient feels pressured into making this change. When I asked the patient initially why he is here, the patient responds "because everybody wants money". Patient is alert and oriented to place and time. He appears fully capable of making legal decisions as well as medical decisions  Past Medical History  Diagnosis Date  . Hyperlipidemia   . Atrial fibrillation     permanent afib  . Long-term (current) use of anticoagulants     previously on coumadin, stopped by Dr [name] due to frequent falls  . BRADYCARDIA-TACHYCARDIA SYNDROME 2007    s/p PPM by Dr [name]   Past Surgical History  Procedure Laterality Date  . Abdominal surgery  1986  . Hernia repair    . Pacemaker insertion  01/28/06; 08-07-13    PPM  (MDT) implanted by Dr [name] 2007; MDT [id number] gen change by Dr Rayann Heman 08/2013  . Permanent pacemaker generator change N/A 08/07/2013    Procedure: PERMANENT PACEMAKER GENERATOR CHANGE;  Surgeon: Coralyn Mark, MD;  Location: [facility];  Service: Cardiovascular;  Laterality: N/A;   Current Outpatient Prescriptions on File Prior to Visit  Medication Sig Dispense Refill  . acetaminophen (TYLENOL) 325 MG tablet Take 650 mg by mouth See admin  instructions. Take 2 tablets (650 mg) 2 times daily, may take 2 more tablets mid-day as needed for pain    . aspirin EC 81 MG tablet Take 81 mg by mouth daily.    . ciprofloxacin (CIPRO) 500 MG tablet Take 1 tablet (500 mg total) by mouth 2 (two) times daily. 28 tablet 0  . digoxin (LANOXIN) 0.25 MG tablet TAKE 1 TABLET BY MOUTH EVERY DAY 30 tablet 3  . loteprednol (LOTEMAX) 0.5 % ophthalmic suspension Place 1 drop into both eyes daily as needed (itching eyes).     . Menthol, Topical  Analgesic, (ICY HOT EX) Apply 1 application topically daily as needed (pain).    . metoprolol succinate (TOPROL-XL) 25 MG 24 hr tablet TAKE 1 TABLET (25 MG TOTAL) BY MOUTH DAILY. TAKE WITH OR IMMEDIATELY FOLLOWING A MEAL. 90 tablet 1  . polyethylene glycol (MIRALAX / GLYCOLAX) packet Take 17 g by mouth daily as needed (constipation).     . simvastatin (ZOCOR) 40 MG tablet TAKE 1 TABLET BY MOUTH AT BEDTIME 90 tablet 1  . tamsulosin (FLOMAX) 0.4 MG CAPS capsule TAKE 1 CAPSULE (0.4 MG TOTAL) BY MOUTH DAILY. 30 capsule 11   No current facility-administered medications on file prior to visit.   No Known Allergies History   Social History  . Marital Status: Married    Spouse Name: N/A  . Number of Children: N/A  . Years of Education: N/A   Occupational History  . Not on file.   Social History Main Topics  . Smoking status: Former Smoker -- 1.00 packs/day for 25 years    Types: Cigarettes    Quit date: 08/02/1961  . Smokeless tobacco: Never Used  . Alcohol Use: No  . Drug Use: No  . Sexual Activity: Not on file   Other Topics Concern  . Not on file   Social History Narrative     Review of Systems  All other systems reviewed and are negative.      Objective:   Physical Exam  Constitutional: He appears well-developed and well-nourished.  Cardiovascular: Normal rate and normal heart sounds.  An irregularly irregular rhythm present.  Pulmonary/Chest: Effort normal and breath sounds normal.  Abdominal: Soft. Bowel sounds are normal.  Vitals reviewed.         Assessment & Plan:  UTI (lower urinary tract infection) - Plan: Urinalysis, Routine w reflex microscopic (not at [facility]), Urine culture  Pain in joint, shoulder region, left - Plan: DG Shoulder Left  Patient's urinary tract infection has seemingly resolved. No further indication persist for antibody ex. I believe the patient has traumatic bursitis in his left shoulder however I will obtain an x-ray of his left  shoulder to rule out a fracture. If fractured clavicle, he will need immobilization in a splint for at least 4 weeks. If x-ray is negative, the patient can return for cortisone injection in his shoulder. I will draft a letter for the patient's lawyer.  Patient is medically competent to make decisions. However patient decides to leave his will as is. He does not want to make any changes at this time. I will also include in the letter the fact that the patient feels pressured into making the changes.

## 2015-01-08 ENCOUNTER — Encounter: Payer: Self-pay | Admitting: Family Medicine

## 2015-01-08 ENCOUNTER — Ambulatory Visit
Admission: RE | Admit: 2015-01-08 | Discharge: 2015-01-08 | Disposition: A | Discharge status: Home / Self Care | Payer: Medicare Other | Source: Ambulatory Visit | Attending: Family Medicine | Admitting: Family Medicine

## 2015-01-08 DIAGNOSIS — M25512 Pain in left shoulder: Secondary | ICD-10-CM

## 2015-01-08 NOTE — Telephone Encounter (Signed)
This encounter was created in error - please disregard.

## 2015-01-09 ENCOUNTER — Other Ambulatory Visit: Payer: Self-pay | Admitting: Family Medicine

## 2015-01-09 ENCOUNTER — Encounter: Payer: Self-pay | Admitting: Family Medicine

## 2015-01-09 DIAGNOSIS — R9389 Abnormal findings on diagnostic imaging of other specified body structures: Secondary | ICD-10-CM

## 2015-01-09 LAB — URINE CULTURE
Colony Count: NO GROWTH
ORGANISM ID, BACTERIA: NO GROWTH

## 2015-01-13 ENCOUNTER — Encounter: Payer: Self-pay | Admitting: Family Medicine

## 2015-01-14 ENCOUNTER — Ambulatory Visit
Admission: RE | Admit: 2015-01-14 | Discharge: 2015-01-14 | Disposition: A | Discharge status: Home / Self Care | Payer: Medicare Other | Source: Ambulatory Visit | Attending: Family Medicine | Admitting: Family Medicine

## 2015-01-14 DIAGNOSIS — R9389 Abnormal findings on diagnostic imaging of other specified body structures: Secondary | ICD-10-CM

## 2015-01-15 ENCOUNTER — Encounter: Payer: Self-pay | Admitting: Family Medicine

## 2015-01-17 ENCOUNTER — Encounter: Payer: Self-pay | Admitting: Family Medicine

## 2015-01-20 ENCOUNTER — Other Ambulatory Visit: Payer: Self-pay | Admitting: Family Medicine

## 2015-01-20 ENCOUNTER — Ambulatory Visit: Payer: Medicare Other | Admitting: Nurse Practitioner

## 2015-01-20 DIAGNOSIS — R42 Dizziness and giddiness: Secondary | ICD-10-CM

## 2015-01-22 ENCOUNTER — Telehealth: Payer: Self-pay | Admitting: Cardiology

## 2015-01-22 ENCOUNTER — Other Ambulatory Visit: Payer: Self-pay | Admitting: Family Medicine

## 2015-01-22 ENCOUNTER — Encounter: Payer: Self-pay | Admitting: Internal Medicine

## 2015-01-22 ENCOUNTER — Ambulatory Visit (INDEPENDENT_AMBULATORY_CARE_PROVIDER_SITE_OTHER): Payer: Medicare Other | Admitting: *Deleted

## 2015-01-22 DIAGNOSIS — I495 Sick sinus syndrome: Secondary | ICD-10-CM

## 2015-01-22 NOTE — Telephone Encounter (Signed)
Spoke with pt and reminded pt of remote transmission that is due today. Pt verbalized understanding.   

## 2015-01-22 NOTE — Progress Notes (Signed)
Remote pacemaker transmission.   

## 2015-01-23 ENCOUNTER — Encounter: Payer: Self-pay | Admitting: Family Medicine

## 2015-01-23 ENCOUNTER — Ambulatory Visit (INDEPENDENT_AMBULATORY_CARE_PROVIDER_SITE_OTHER): Payer: Medicare Other | Admitting: Family Medicine

## 2015-01-23 ENCOUNTER — Ambulatory Visit
Admission: RE | Admit: 2015-01-23 | Discharge: 2015-01-23 | Disposition: A | Discharge status: Home / Self Care | Payer: Medicare Other | Source: Ambulatory Visit | Attending: Family Medicine | Admitting: Family Medicine

## 2015-01-23 VITALS — BP 110/68 | HR 72 | Temp 98.1°F | Resp 18 | Ht 70.0 in

## 2015-01-23 DIAGNOSIS — M7552 Bursitis of left shoulder: Secondary | ICD-10-CM | POA: Diagnosis not present

## 2015-01-23 DIAGNOSIS — H8110 Benign paroxysmal vertigo, unspecified ear: Secondary | ICD-10-CM | POA: Diagnosis not present

## 2015-01-23 DIAGNOSIS — R42 Dizziness and giddiness: Secondary | ICD-10-CM

## 2015-01-23 NOTE — Progress Notes (Signed)
Subjective:    Patient ID: [name], male    DOB: 09-25-1922, 81 y.o.   MRN: [id number]  HPI  Patient fell 4-5 weeks ago. Ever since that time, the patient has been complaining of the room spinning whenever he turns his head. He simply needs to turn his head, look up, looked down, or rolling over in bed in the room will begin to spin. He denies any dizziness or vertigo when he keeps his head completely still. CT scan of the head was obtained which showed no acute intracranial process. Since that fall the patient has also been complaining of pain in his left shoulder. Patient reports pain with abduction greater than 90. X-rays of the shoulder were negative for any fracture. He has negative Spurling sign. He has a positive empty can sign. He has a positive Hawkins sign. Past Medical History  Diagnosis Date  . Hyperlipidemia   . Atrial fibrillation     permanent afib  . Long-term (current) use of anticoagulants     previously on coumadin, stopped by Dr [name] due to frequent falls  . BRADYCARDIA-TACHYCARDIA SYNDROME 2007    s/p PPM by Dr [name]   Past Surgical History  Procedure Laterality Date  . Abdominal surgery  1986  . Hernia repair    . Pacemaker insertion  01/28/06; 08-07-13    PPM  (MDT) implanted by Dr [name] 2007; MDT [id number] gen change by Dr Rayann Heman 08/2013  . Permanent pacemaker generator change N/A 08/07/2013    Procedure: PERMANENT PACEMAKER GENERATOR CHANGE;  Surgeon: Coralyn Mark, MD;  Location: [facility];  Service: Cardiovascular;  Laterality: N/A;   Current Outpatient Prescriptions on File Prior to Visit  Medication Sig Dispense Refill  . acetaminophen (TYLENOL) 325 MG tablet Take 650 mg by mouth See admin instructions. Take 2 tablets (650 mg) 2 times daily, may take 2 more tablets mid-day as needed for pain    . aspirin EC 81 MG tablet Take 81 mg by mouth daily.    . ciprofloxacin (CIPRO) 500 MG tablet Take 1 tablet (500 mg total) by mouth 2 (two) times daily.  28 tablet 0  . digoxin (LANOXIN) 0.25 MG tablet TAKE 1 TABLET BY MOUTH EVERY DAY 30 tablet 5  . loteprednol (LOTEMAX) 0.5 % ophthalmic suspension Place 1 drop into both eyes daily as needed (itching eyes).     . Menthol, Topical Analgesic, (ICY HOT EX) Apply 1 application topically daily as needed (pain).    . metoprolol succinate (TOPROL-XL) 25 MG 24 hr tablet TAKE 1 TABLET (25 MG TOTAL) BY MOUTH DAILY. TAKE WITH OR IMMEDIATELY FOLLOWING A MEAL. 90 tablet 1  . polyethylene glycol (MIRALAX / GLYCOLAX) packet Take 17 g by mouth daily as needed (constipation).     . simvastatin (ZOCOR) 40 MG tablet TAKE 1 TABLET BY MOUTH AT BEDTIME 90 tablet 1  . tamsulosin (FLOMAX) 0.4 MG CAPS capsule TAKE 1 CAPSULE (0.4 MG TOTAL) BY MOUTH DAILY. 30 capsule 11   No current facility-administered medications on file prior to visit.   No Known Allergies History   Social History  . Marital Status: Married    Spouse Name: N/A  . Number of Children: N/A  . Years of Education: N/A   Occupational History  . Not on file.   Social History Main Topics  . Smoking status: Former Smoker -- 1.00 packs/day for 25 years    Types: Cigarettes    Quit date: 08/02/1961  . Smokeless tobacco: Never  Used  . Alcohol Use: No  . Drug Use: No  . Sexual Activity: Not on file   Other Topics Concern  . Not on file   Social History Narrative     Review of Systems  All other systems reviewed and are negative.      Objective:   Physical Exam  Cardiovascular: Normal rate and intact distal pulses.  An irregularly irregular rhythm present.  Pulmonary/Chest: Effort normal and breath sounds normal. No respiratory distress. He has no wheezes. He has no rales.  Musculoskeletal:       Left shoulder: He exhibits decreased range of motion, tenderness, pain and decreased strength. He exhibits no bony tenderness.  Vitals reviewed.         Assessment & Plan:  Subacromial bursitis, left  Benign paroxysmal positional  vertigo, unspecified laterality  Patient has BPPV. I have recommended physical therapy and Epley maneuvers. They would like to give the patient a chance to recover on his own. However they are requesting a cortisone injection in the left shoulder. Using sterile technique, I injected the left shoulder with 2 mL of lidocaine, 2 mL of Marcaine, and 2 mL of 40 mg per mL Kenalog. The patient tolerated the procedure well without complication

## 2015-01-26 LAB — CUP PACEART REMOTE DEVICE CHECK
Battery Impedance: 259 Ohm
Battery Remaining Longevity: 97 mo
Brady Statistic RV Percent Paced: 81 %
Date Time Interrogation Session: 20160622162411
Lead Channel Pacing Threshold Pulse Width: 0.4 ms
Lead Channel Setting Pacing Amplitude: 2.5 V
Lead Channel Setting Sensing Sensitivity: 2 mV
MDC IDC MSMT BATTERY VOLTAGE: 2.77 V
MDC IDC MSMT LEADCHNL RA IMPEDANCE VALUE: 0 Ohm
MDC IDC MSMT LEADCHNL RV IMPEDANCE VALUE: 506 Ohm
MDC IDC MSMT LEADCHNL RV PACING THRESHOLD AMPLITUDE: 0.5 V
MDC IDC SET LEADCHNL RV PACING PULSEWIDTH: 0.4 ms

## 2015-01-27 ENCOUNTER — Encounter: Payer: Self-pay | Admitting: Family Medicine

## 2015-01-29 ENCOUNTER — Encounter: Payer: Self-pay | Admitting: Cardiology

## 2015-01-31 ENCOUNTER — Ambulatory Visit (INDEPENDENT_AMBULATORY_CARE_PROVIDER_SITE_OTHER): Payer: Medicare Other | Admitting: Nurse Practitioner

## 2015-01-31 ENCOUNTER — Encounter: Payer: Self-pay | Admitting: Nurse Practitioner

## 2015-01-31 VITALS — BP 110/60 | HR 76 | Ht 70.0 in | Wt 133.0 lb

## 2015-01-31 DIAGNOSIS — I4891 Unspecified atrial fibrillation: Secondary | ICD-10-CM

## 2015-01-31 DIAGNOSIS — Z950 Presence of cardiac pacemaker: Secondary | ICD-10-CM | POA: Diagnosis not present

## 2015-01-31 DIAGNOSIS — E785 Hyperlipidemia, unspecified: Secondary | ICD-10-CM | POA: Diagnosis not present

## 2015-01-31 LAB — BASIC METABOLIC PANEL
BUN: 27 mg/dL — ABNORMAL HIGH (ref 6–23)
CO2: 30 mEq/L (ref 19–32)
Calcium: 9.3 mg/dL (ref 8.4–10.5)
Chloride: 102 mEq/L (ref 96–112)
Creatinine, Ser: 1.45 mg/dL (ref 0.40–1.50)
GFR: 48.54 mL/min — ABNORMAL LOW (ref >60.00)
Glucose, Bld: 95 mg/dL (ref 70–99)
Potassium: 4.4 mEq/L (ref 3.5–5.1)
Sodium: 138 mEq/L (ref 135–145)

## 2015-01-31 NOTE — Progress Notes (Signed)
CARDIOLOGY OFFICE NOTE  Date:  01/31/2015    [name] Date of Birth: [birth year] Medical Record #[id number]  PCP:  Odette Fraction, MD  Cardiologist:  Allred    Chief Complaint  Patient presents with  . Atrial Fibrillation    6 month check - seen for Dr. Rayann Heman.     History of Present Illness: [name] is a 81 y.o. male who presents today for a 6 month check. Seen for Dr. Rayann Heman. He has chronic atrial fib, no longer on coumadin due to past history of falls and has a pacemaker in place for symptomatic bradycardia.   Last seen here in December by Dr. Rayann Heman and was doing well.  Comes in today. He is here with his wife. Doing well. Now using a walker to walk. Brace on the left lower leg in place.  Hard of hearing and has 2 hearing aids. Continues to fall. He fell on his wife on Mother's Day - broke his wife's arm. Now with around the clock care at home. No passing out reported. Losing weight.    Past Medical History  Diagnosis Date  . Hyperlipidemia   . Atrial fibrillation     permanent afib  . Long-term (current) use of anticoagulants     previously on coumadin, stopped by Dr [name] due to frequent falls  . BRADYCARDIA-TACHYCARDIA SYNDROME 2007    s/p PPM by Dr [name]    Past Surgical History  Procedure Laterality Date  . Abdominal surgery  1986  . Hernia repair    . Pacemaker insertion  01/28/06; 08-07-13    PPM  (MDT) implanted by Dr [name] 2007; MDT [id number] gen change by Dr Rayann Heman 08/2013  . Permanent pacemaker generator change N/A 08/07/2013    Procedure: PERMANENT PACEMAKER GENERATOR CHANGE;  Surgeon: Coralyn Mark, MD;  Location: [facility];  Service: Cardiovascular;  Laterality: N/A;     Medications: Current Outpatient Prescriptions  Medication Sig Dispense Refill  . acetaminophen (TYLENOL) 325 MG tablet Take 650 mg by mouth See admin instructions. Take 2 tablets (650 mg) 2 times daily, may take 2 more tablets mid-day as needed for pain      . aspirin EC 81 MG tablet Take 81 mg by mouth daily.    . digoxin (LANOXIN) 0.25 MG tablet TAKE 1 TABLET BY MOUTH EVERY DAY 30 tablet 5  . loteprednol (LOTEMAX) 0.5 % ophthalmic suspension Place 1 drop into both eyes daily as needed (itching eyes).     . Menthol, Topical Analgesic, (ICY HOT EX) Apply 1 application topically daily as needed (pain).    . metoprolol succinate (TOPROL-XL) 25 MG 24 hr tablet TAKE 1 TABLET (25 MG TOTAL) BY MOUTH DAILY. TAKE WITH OR IMMEDIATELY FOLLOWING A MEAL. 90 tablet 1  . polyethylene glycol (MIRALAX / GLYCOLAX) packet Take 17 g by mouth daily as needed (constipation).     . simvastatin (ZOCOR) 40 MG tablet TAKE 1 TABLET BY MOUTH AT BEDTIME 90 tablet 1  . tamsulosin (FLOMAX) 0.4 MG CAPS capsule TAKE 1 CAPSULE (0.4 MG TOTAL) BY MOUTH DAILY. 30 capsule 11   No current facility-administered medications for this visit.    Allergies: No Known Allergies  Social History: The patient  reports that he quit smoking about 53 years ago. His smoking use included Cigarettes. He has a 25 pack-year smoking history. He has never used smokeless tobacco. He reports that he does not drink alcohol or use illicit drugs.   Family History:  The patient's family history includes Heart attack in his father.   Review of Systems: Please see the history of present illness.   Otherwise, the review of systems is positive for none.   All other systems are reviewed and negative.   Physical Exam: VS:  BP 110/60 mmHg  Pulse 76  Ht 5\' 10"  (1.778 m)  Wt 133 lb (60.328 kg)  BMI 19.08 kg/m2 .  BMI Body mass index is 19.08 kg/(m^2).  Wt Readings from Last 3 Encounters:  01/31/15 133 lb (60.328 kg)  01/07/15 140 lb (63.504 kg)  12/15/14 143 lb (64.864 kg)    General: Pleasant. He is chronically ill, hard of hearing but in no acute distress. Looks thinner. HEENT: Normal. Neck: Supple, no JVD, carotid bruits, or masses noted.  Cardiac: Fairly regular rate and rhythm.  No edema.   Respiratory:  Lungs are clear to auscultation bilaterally with normal work of breathing.  GI: Soft and nontender.  MS: Gait and ROM intact.Using a walker. Brace on the left lower leg.  Skin: Warm and dry. Color is normal.  Neuro:  Strength and sensation are intact and no gross focal deficits noted.  Psych: Alert, appropriate and with normal affect.   LABORATORY DATA:  EKG:  EKG is not ordered today.   Lab Results  Component Value Date   WBC 4.9 12/15/2014   HGB 12.4* 12/15/2014   HCT 38.9* 12/15/2014   PLT 144* 12/15/2014   GLUCOSE 96 12/15/2014   CHOL 137 01/21/2014   TRIG 112 01/21/2014   HDL 42 01/21/2014   LDLCALC 73 01/21/2014   ALT 11* 12/15/2014   AST 22 12/15/2014   NA 140 12/15/2014   K 4.1 12/15/2014   CL 104 12/15/2014   CREATININE 1.41* 12/15/2014   BUN 20 12/15/2014   CO2 31 12/15/2014   TSH 2.442 04/16/2014   INR 1.3 11/13/2008    BNP (last 3 results) No results for input(s): BNP in the last 8760 hours.  ProBNP (last 3 results) No results for input(s): PROBNP in the last 8760 hours.   Other Studies Reviewed Today: N/A  Assessment/Plan: 1. Chronic atrial fib - no anticoagulation due to his multiple falls.   2. Underlying PPM - followed by Dr. Rayann Heman  3. Falls/unsteady balance   4. Advancing age.  Seems to be declining in a generalized fashion. Glad that he has around the clock care.   Current medicines are reviewed with the patient today.  The patient does not have concerns regarding medicines other than what has been noted above.  The following changes have been made:  See above.  Labs/ tests ordered today include:    Orders Placed This Encounter  Procedures  . Basic metabolic panel  . Digoxin level     Disposition:   FU with Dr. Rayann Heman in 6 months.   Patient is agreeable to this plan and will call if any problems develop in the interim.   Signed: Burtis Junes, RN, ANP-C 01/31/2015 10:50 AM  [facility] [address] [place], [place]  [address] Phone: [phone] Fax: [phone]

## 2015-01-31 NOTE — Patient Instructions (Addendum)
We will be checking the following labs today - BMET/Digoxin   Medication Instructions:    Continue with your current medicines.     Testing/Procedures To Be Arranged:  N/A  Follow-Up:   See Dr. Rayann Heman in 6 months.     Other Special Instructions:   N/A  Call the [place] office at [phone] if you have any questions, problems or concerns.

## 2015-02-01 LAB — DIGOXIN LEVEL: Digoxin Level: 2.1 ug/L — ABNORMAL HIGH (ref 0.8–2.0)

## 2015-02-05 ENCOUNTER — Other Ambulatory Visit: Payer: Self-pay

## 2015-02-05 MED ORDER — DIGOXIN 125 MCG PO TABS: 125.0000 ug | ORAL_TABLET | Freq: Every day | ORAL | Status: DC

## 2015-02-21 ENCOUNTER — Encounter: Payer: Self-pay | Admitting: Family Medicine

## 2015-03-31 ENCOUNTER — Encounter: Payer: Self-pay | Admitting: Family Medicine

## 2015-04-09 ENCOUNTER — Other Ambulatory Visit: Payer: Self-pay | Admitting: Internal Medicine

## 2015-04-10 ENCOUNTER — Encounter: Payer: Self-pay | Admitting: Family Medicine

## 2015-04-10 ENCOUNTER — Ambulatory Visit (INDEPENDENT_AMBULATORY_CARE_PROVIDER_SITE_OTHER): Payer: Medicare Other | Admitting: Family Medicine

## 2015-04-10 VITALS — BP 100/64 | HR 82 | Temp 98.7°F | Resp 16 | Ht 70.0 in | Wt 133.0 lb

## 2015-04-10 DIAGNOSIS — M25512 Pain in left shoulder: Secondary | ICD-10-CM

## 2015-04-10 MED ORDER — TRAMADOL-ACETAMINOPHEN 37.5-325 MG PO TABS: 1.0000 | ORAL_TABLET | Freq: Three times a day (TID) | ORAL | Status: DC | PRN

## 2015-04-11 ENCOUNTER — Encounter: Payer: Self-pay | Admitting: Family Medicine

## 2015-04-11 NOTE — Progress Notes (Signed)
Subjective:    Patient ID: [name], male    DOB: 11-09-1922, 81 y.o.   MRN: [id number]  HPI Patient has a history of chronic pain in his left shoulder. He has been evaluated with an x-ray which revealed mild AC degenerative changes.  He has not had an MRI given his age and also his pacemaker. Abduction of his shoulder is limited to 80 due to pain. Recently he fell in the shower and his caregiver grabbed his left arm to help vent the fall. This caused a forward jerking injury to the shoulder. There was no dislocation. However he now has pain in the anterior portion of the shoulder. He has chronic atrophy of the supraspinatous muscle as evidenced by a large palpable deficit superior to the spine of the scapula on the left. He has a significantly positive empty can sign. Positive Hawkins sign. Positive O'Brien sign. Past Medical History  Diagnosis Date  . Hyperlipidemia   . Atrial fibrillation     permanent afib  . Long-term (current) use of anticoagulants     previously on coumadin, stopped by Dr [name] due to frequent falls  . BRADYCARDIA-TACHYCARDIA SYNDROME 2007    s/p PPM by Dr [name]   Past Surgical History  Procedure Laterality Date  . Abdominal surgery  1986  . Hernia repair    . Pacemaker insertion  01/28/06; 08-07-13    PPM  (MDT) implanted by Dr [name] 2007; MDT [id number] gen change by Dr Rayann Heman 08/2013  . Permanent pacemaker generator change N/A 08/07/2013    Procedure: PERMANENT PACEMAKER GENERATOR CHANGE;  Surgeon: Coralyn Mark, MD;  Location: [facility];  Service: Cardiovascular;  Laterality: N/A;   Current Outpatient Prescriptions on File Prior to Visit  Medication Sig Dispense Refill  . acetaminophen (TYLENOL) 325 MG tablet Take 650 mg by mouth See admin instructions. Take 2 tablets (650 mg) 2 times daily, may take 2 more tablets mid-day as needed for pain    . aspirin EC 81 MG tablet Take 81 mg by mouth daily.    . digoxin (LANOXIN) 0.125 MG tablet Take 1 tablet  (125 mcg total) by mouth daily. 90 tablet 3  . loteprednol (LOTEMAX) 0.5 % ophthalmic suspension Place 1 drop into both eyes daily as needed (itching eyes).     . Menthol, Topical Analgesic, (ICY HOT EX) Apply 1 application topically daily as needed (pain).    . metoprolol succinate (TOPROL-XL) 25 MG 24 hr tablet TAKE 1 TABLET (25 MG TOTAL) BY MOUTH DAILY. TAKE WITH OR IMMEDIATELY FOLLOWING A MEAL. 90 tablet 0  . polyethylene glycol (MIRALAX / GLYCOLAX) packet Take 17 g by mouth daily as needed (constipation).     . simvastatin (ZOCOR) 40 MG tablet TAKE 1 TABLET BY MOUTH AT BEDTIME 90 tablet 1  . tamsulosin (FLOMAX) 0.4 MG CAPS capsule TAKE 1 CAPSULE (0.4 MG TOTAL) BY MOUTH DAILY. 30 capsule 11   No current facility-administered medications on file prior to visit.   No Known Allergies Social History   Social History  . Marital Status: Married    Spouse Name: N/A  . Number of Children: N/A  . Years of Education: N/A   Occupational History  . Not on file.   Social History Main Topics  . Smoking status: Former Smoker -- 1.00 packs/day for 25 years    Types: Cigarettes    Quit date: 08/02/1961  . Smokeless tobacco: Never Used  . Alcohol Use: No  . Drug Use: No  .  Sexual Activity: Not on file   Other Topics Concern  . Not on file   Social History Narrative      Review of Systems  All other systems reviewed and are negative.      Objective:   Physical Exam  Cardiovascular: Normal rate.  An irregularly irregular rhythm present.  Pulmonary/Chest: Effort normal and breath sounds normal.  Musculoskeletal:       Left shoulder: He exhibits decreased range of motion, tenderness, bony tenderness, crepitus, deformity, pain and decreased strength. He exhibits no swelling.  Vitals reviewed.         Assessment & Plan:  Left shoulder pain - Plan: traMADol-acetaminophen (ULTRACET) 37.5-325 MG per tablet  I suspect the patient has a chronic tear in his supraspinatous muscle.  I'm also concerned he may have suffered a labral tear with a recent injury however he is a very poor surgical candidate given his age and his cardiac problems. Furthermore I cannot obtain an MRI because of his pacemaker. The family would like to proceed with conservative management at the present time. Therefore we will begin scheduled Ultracet 1 tablet every 8 hours. If pain does not improve we can try cortisone injection. If this fails, we can proceed with a referral to orthopedics

## 2015-04-14 ENCOUNTER — Other Ambulatory Visit: Payer: Self-pay | Admitting: Family Medicine

## 2015-04-28 ENCOUNTER — Telehealth: Payer: Self-pay | Admitting: Cardiology

## 2015-04-28 ENCOUNTER — Ambulatory Visit (INDEPENDENT_AMBULATORY_CARE_PROVIDER_SITE_OTHER): Payer: Medicare Other | Admitting: *Deleted

## 2015-04-28 DIAGNOSIS — I495 Sick sinus syndrome: Secondary | ICD-10-CM | POA: Diagnosis not present

## 2015-04-28 NOTE — Telephone Encounter (Signed)
Confirmed remote transmission w/ pt caregiver.   

## 2015-04-29 NOTE — Progress Notes (Signed)
Remote pacemaker transmission.   

## 2015-05-05 LAB — CUP PACEART REMOTE DEVICE CHECK
Date Time Interrogation Session: 20160926193556
Lead Channel Impedance Value: 0 Ohm
Lead Channel Pacing Threshold Amplitude: 0.5 V
Lead Channel Pacing Threshold Pulse Width: 0.4 ms
MDC IDC MSMT BATTERY IMPEDANCE: 284 Ohm
MDC IDC MSMT BATTERY REMAINING LONGEVITY: 94 mo
MDC IDC MSMT BATTERY VOLTAGE: 2.78 V
MDC IDC MSMT LEADCHNL RV IMPEDANCE VALUE: 489 Ohm
MDC IDC SET LEADCHNL RV PACING AMPLITUDE: 2.5 V
MDC IDC SET LEADCHNL RV PACING PULSEWIDTH: 0.4 ms
MDC IDC SET LEADCHNL RV SENSING SENSITIVITY: 2 mV
MDC IDC STAT BRADY RV PERCENT PACED: 81 %

## 2015-05-20 ENCOUNTER — Encounter: Payer: Self-pay | Admitting: *Deleted

## 2015-05-22 ENCOUNTER — Encounter: Payer: Self-pay | Admitting: Family Medicine

## 2015-05-22 ENCOUNTER — Ambulatory Visit (INDEPENDENT_AMBULATORY_CARE_PROVIDER_SITE_OTHER): Payer: Medicare Other | Admitting: Family Medicine

## 2015-05-22 VITALS — BP 90/58 | HR 78 | Temp 98.2°F | Resp 16 | Ht 70.0 in | Wt 137.0 lb

## 2015-05-22 DIAGNOSIS — M25512 Pain in left shoulder: Secondary | ICD-10-CM | POA: Diagnosis not present

## 2015-05-22 NOTE — Progress Notes (Signed)
Subjective:    Patient ID: [name], male    DOB: [birth year], 81 y.o.   MRN: [id number]  HPI 04/20/15 Patient has a history of chronic pain in his left shoulder. He has been evaluated with an x-ray which revealed mild AC degenerative changes.  He has not had an MRI given his age and also his pacemaker. Abduction of his shoulder is limited to 80 due to pain. Recently he fell in the shower and his caregiver grabbed his left arm to help vent the fall. This caused a forward jerking injury to the shoulder. There was no dislocation. However he now has pain in the anterior portion of the shoulder. He has chronic atrophy of the supraspinatous muscle as evidenced by a large palpable deficit superior to the spine of the scapula on the left. He has a significantly positive empty can sign. Positive Hawkins sign. Positive O'Brien sign.  At that  time, my plan was: I suspect the patient has a chronic tear in his supraspinatous muscle. I'm also concerned he may have suffered a labral tear with a recent injury however he is a very poor surgical candidate given his age and his cardiac problems. Furthermore I cannot obtain an MRI because of his pacemaker. The family would like to proceed with conservative management at the present time. Therefore we will begin scheduled Ultracet 1 tablet every 8 hours. If pain does not improve we can try cortisone injection. If this fails, we can proceed with a referral to orthopedics  05/22/15 Patient's shoulder is no better. In fact it is worse. He now endorses pain if he abducts his shoulder greater than 10-15. On exam he essentially has a frozen shoulder. He holds his arm fixed against the side and avoids moving it.  He has an extremely difficult time getting dressed. The Ultracet provided him no relief. In fact, due to confusion, he went back to taking plain Tylenol. Past Medical History  Diagnosis Date  . Hyperlipidemia   . Atrial fibrillation     permanent afib  .  Long-term (current) use of anticoagulants     previously on coumadin, stopped by Dr [name] due to frequent falls  . BRADYCARDIA-TACHYCARDIA SYNDROME 2007    s/p PPM by Dr [name]   Past Surgical History  Procedure Laterality Date  . Abdominal surgery  1986  . Hernia repair    . Pacemaker insertion  01/28/06; 08-07-13    PPM  (MDT) implanted by Dr [name] 2007; MDT [id number] gen change by Dr Rayann Heman 08/2013  . Permanent pacemaker generator change N/A 08/07/2013    Procedure: PERMANENT PACEMAKER GENERATOR CHANGE;  Surgeon: Coralyn Mark, MD;  Location: [facility];  Service: Cardiovascular;  Laterality: N/A;   Current Outpatient Prescriptions on File Prior to Visit  Medication Sig Dispense Refill  . acetaminophen (TYLENOL) 325 MG tablet Take 650 mg by mouth See admin instructions. Take 2 tablets (650 mg) 2 times daily, may take 2 more tablets mid-day as needed for pain    . aspirin EC 81 MG tablet Take 81 mg by mouth daily.    . digoxin (LANOXIN) 0.125 MG tablet Take 1 tablet (125 mcg total) by mouth daily. 90 tablet 3  . loteprednol (LOTEMAX) 0.5 % ophthalmic suspension Place 1 drop into both eyes daily as needed (itching eyes).     . Menthol, Topical Analgesic, (ICY HOT EX) Apply 1 application topically daily as needed (pain).    . metoprolol succinate (TOPROL-XL) 25 MG 24 hr  tablet TAKE 1 TABLET (25 MG TOTAL) BY MOUTH DAILY. TAKE WITH OR IMMEDIATELY FOLLOWING A MEAL. 90 tablet 0  . polyethylene glycol (MIRALAX / GLYCOLAX) packet Take 17 g by mouth daily as needed (constipation).     . simvastatin (ZOCOR) 40 MG tablet TAKE 1 TABLET BY MOUTH AT BEDTIME 90 tablet 1  . tamsulosin (FLOMAX) 0.4 MG CAPS capsule TAKE 1 CAPSULE (0.4 MG TOTAL) BY MOUTH DAILY. 30 capsule 10  . traMADol-acetaminophen (ULTRACET) 37.5-325 MG per tablet Take 1 tablet by mouth every 8 (eight) hours as needed. 90 tablet 0   No current facility-administered medications on file prior to visit.   No Known Allergies Social History    Social History  . Marital Status: Married    Spouse Name: N/A  . Number of Children: N/A  . Years of Education: N/A   Occupational History  . Not on file.   Social History Main Topics  . Smoking status: Former Smoker -- 1.00 packs/day for 25 years    Types: Cigarettes    Quit date: 08/02/1961  . Smokeless tobacco: Never Used  . Alcohol Use: No  . Drug Use: No  . Sexual Activity: Not on file   Other Topics Concern  . Not on file   Social History Narrative      Review of Systems  All other systems reviewed and are negative.      Objective:   Physical Exam  Cardiovascular: Normal rate.  An irregularly irregular rhythm present.  Pulmonary/Chest: Effort normal and breath sounds normal.  Musculoskeletal:       Left shoulder: He exhibits decreased range of motion, tenderness, bony tenderness, crepitus, deformity, pain and decreased strength. He exhibits no swelling.  Vitals reviewed.         Assessment & Plan:  Left shoulder pain - Plan: Ambulatory referral to Orthopedic Surgery  Using sterile technique, I injected the left shoulder with a mixture of 2 mL of lidocaine, 2 mL of Marcaine, and 2 mL of 40 mg per mL Kenalog. He tolerated the procedure well. I will go ahead and refer the patient to orthopedic surgery given the fact I believe he is developing a frozen shoulder. He has failed conservative treatment.

## 2015-06-05 ENCOUNTER — Other Ambulatory Visit: Payer: Self-pay | Admitting: Family Medicine

## 2015-06-05 NOTE — Telephone Encounter (Signed)
Refill appropriate and filled per protocol. 

## 2015-06-13 ENCOUNTER — Other Ambulatory Visit: Payer: Self-pay | Admitting: *Deleted

## 2015-06-13 MED ORDER — TAMSULOSIN HCL 0.4 MG PO CAPS: ORAL_CAPSULE | ORAL | Status: DC

## 2015-06-13 NOTE — Telephone Encounter (Signed)
Received fax requesting refill on Flomax.   Refill appropriate and filled per protocol. 

## 2015-06-29 ENCOUNTER — Encounter: Payer: Self-pay | Admitting: Family Medicine

## 2015-06-30 ENCOUNTER — Ambulatory Visit (INDEPENDENT_AMBULATORY_CARE_PROVIDER_SITE_OTHER): Payer: Medicare Other | Admitting: Family Medicine

## 2015-06-30 ENCOUNTER — Encounter: Payer: Self-pay | Admitting: Family Medicine

## 2015-06-30 VITALS — BP 110/58 | HR 82 | Temp 97.5°F | Resp 16 | Ht 70.0 in | Wt 137.0 lb

## 2015-06-30 DIAGNOSIS — Z111 Encounter for screening for respiratory tuberculosis: Secondary | ICD-10-CM

## 2015-06-30 DIAGNOSIS — Z022 Encounter for examination for admission to residential institution: Secondary | ICD-10-CM

## 2015-06-30 DIAGNOSIS — Z0289 Encounter for other administrative examinations: Secondary | ICD-10-CM

## 2015-07-01 ENCOUNTER — Encounter: Payer: Self-pay | Admitting: Family Medicine

## 2015-07-01 DIAGNOSIS — N400 Enlarged prostate without lower urinary tract symptoms: Secondary | ICD-10-CM | POA: Insufficient documentation
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
## 2015-07-02 ENCOUNTER — Ambulatory Visit: Payer: Self-pay | Admitting: Family Medicine

## 2015-07-02 ENCOUNTER — Encounter: Payer: Self-pay | Admitting: Family Medicine

## 2015-07-02 LAB — TB SKIN TEST
INDURATION: 0 mm
TB SKIN TEST: NEGATIVE

## 2015-07-07 ENCOUNTER — Telehealth: Payer: Self-pay | Admitting: Family Medicine

## 2015-07-07 NOTE — Telephone Encounter (Signed)
Levada Dy from [facility] calling regarding this patient fl2 form would like a call back asap at [phone]

## 2015-07-08 NOTE — Telephone Encounter (Signed)
Needed new orders to do ot & pt - verbal orders given

## 2015-07-08 NOTE — Telephone Encounter (Signed)
LMTRC

## 2015-07-14 ENCOUNTER — Telehealth: Payer: Self-pay | Admitting: Family Medicine

## 2015-07-14 NOTE — Telephone Encounter (Signed)
ok 

## 2015-07-14 NOTE — Telephone Encounter (Signed)
Assisted living facility wanted approval for physical therapy for [name].  Verbal approval given.

## 2015-07-18 ENCOUNTER — Telehealth: Payer: Self-pay | Admitting: Family Medicine

## 2015-07-18 NOTE — Telephone Encounter (Signed)
Pt not feeling well [facility]ady and PT wants OK to do PT eval next week.  Verbal approval given.

## 2015-07-22 ENCOUNTER — Encounter (HOSPITAL_BASED_OUTPATIENT_CLINIC_OR_DEPARTMENT_OTHER): Payer: Self-pay | Admitting: Emergency Medicine

## 2015-07-22 ENCOUNTER — Emergency Department (HOSPITAL_BASED_OUTPATIENT_CLINIC_OR_DEPARTMENT_OTHER): Payer: Medicare Other

## 2015-07-22 ENCOUNTER — Emergency Department (HOSPITAL_BASED_OUTPATIENT_CLINIC_OR_DEPARTMENT_OTHER)
Admission: EM | Admit: 2015-07-22 | Discharge: 2015-07-22 | Disposition: A | Discharge status: Home / Self Care | Payer: Medicare Other | Attending: Emergency Medicine | Admitting: Emergency Medicine

## 2015-07-22 DIAGNOSIS — W19XXXA Unspecified fall, initial encounter: Secondary | ICD-10-CM

## 2015-07-22 DIAGNOSIS — S6991XA Unspecified injury of right wrist, hand and finger(s), initial encounter: Secondary | ICD-10-CM | POA: Diagnosis not present

## 2015-07-22 DIAGNOSIS — F0390 Unspecified dementia without behavioral disturbance: Secondary | ICD-10-CM | POA: Insufficient documentation

## 2015-07-22 DIAGNOSIS — S00211A Abrasion of right eyelid and periocular area, initial encounter: Secondary | ICD-10-CM | POA: Insufficient documentation

## 2015-07-22 DIAGNOSIS — Z23 Encounter for immunization: Secondary | ICD-10-CM | POA: Insufficient documentation

## 2015-07-22 DIAGNOSIS — S40022A Contusion of left upper arm, initial encounter: Secondary | ICD-10-CM | POA: Diagnosis not present

## 2015-07-22 DIAGNOSIS — S4991XA Unspecified injury of right shoulder and upper arm, initial encounter: Secondary | ICD-10-CM | POA: Diagnosis present

## 2015-07-22 DIAGNOSIS — N400 Enlarged prostate without lower urinary tract symptoms: Secondary | ICD-10-CM | POA: Diagnosis not present

## 2015-07-22 DIAGNOSIS — S0081XA Abrasion of other part of head, initial encounter: Secondary | ICD-10-CM

## 2015-07-22 DIAGNOSIS — S40021A Contusion of right upper arm, initial encounter: Secondary | ICD-10-CM | POA: Insufficient documentation

## 2015-07-22 DIAGNOSIS — S8012XA Contusion of left lower leg, initial encounter: Secondary | ICD-10-CM | POA: Insufficient documentation

## 2015-07-22 DIAGNOSIS — Z79899 Other long term (current) drug therapy: Secondary | ICD-10-CM | POA: Diagnosis not present

## 2015-07-22 DIAGNOSIS — Y998 Other external cause status: Secondary | ICD-10-CM | POA: Insufficient documentation

## 2015-07-22 DIAGNOSIS — Z7982 Long term (current) use of aspirin: Secondary | ICD-10-CM | POA: Insufficient documentation

## 2015-07-22 DIAGNOSIS — W1839XA Other fall on same level, initial encounter: Secondary | ICD-10-CM | POA: Insufficient documentation

## 2015-07-22 DIAGNOSIS — Y9289 Other specified places as the place of occurrence of the external cause: Secondary | ICD-10-CM | POA: Diagnosis not present

## 2015-07-22 DIAGNOSIS — S8011XA Contusion of right lower leg, initial encounter: Secondary | ICD-10-CM | POA: Diagnosis not present

## 2015-07-22 DIAGNOSIS — Z7901 Long term (current) use of anticoagulants: Secondary | ICD-10-CM | POA: Diagnosis not present

## 2015-07-22 DIAGNOSIS — Z950 Presence of cardiac pacemaker: Secondary | ICD-10-CM | POA: Diagnosis not present

## 2015-07-22 DIAGNOSIS — Z87891 Personal history of nicotine dependence: Secondary | ICD-10-CM | POA: Diagnosis not present

## 2015-07-22 DIAGNOSIS — T07XXXA Unspecified multiple injuries, initial encounter: Secondary | ICD-10-CM

## 2015-07-22 DIAGNOSIS — Y9301 Activity, walking, marching and hiking: Secondary | ICD-10-CM | POA: Diagnosis not present

## 2015-07-22 DIAGNOSIS — I482 Chronic atrial fibrillation: Secondary | ICD-10-CM | POA: Diagnosis not present

## 2015-07-22 DIAGNOSIS — Z66 Do not resuscitate: Secondary | ICD-10-CM | POA: Diagnosis not present

## 2015-07-22 DIAGNOSIS — E785 Hyperlipidemia, unspecified: Secondary | ICD-10-CM | POA: Insufficient documentation

## 2015-07-22 DIAGNOSIS — M21372 Foot drop, left foot: Secondary | ICD-10-CM | POA: Diagnosis not present

## 2015-07-22 DIAGNOSIS — Z8669 Personal history of other diseases of the nervous system and sense organs: Secondary | ICD-10-CM | POA: Insufficient documentation

## 2015-07-22 HISTORY — DX: Pure hypercholesterolemia, unspecified: E78.00

## 2015-07-22 HISTORY — DX: Do not resuscitate: Z66

## 2015-07-22 HISTORY — DX: Unspecified dementia, unspecified severity, without behavioral disturbance, psychotic disturbance, mood disturbance, and anxiety: F03.90

## 2015-07-22 HISTORY — DX: Unspecified glaucoma: H40.9

## 2015-07-22 LAB — BASIC METABOLIC PANEL
ANION GAP: 6 (ref 5–15)
BUN: 21 mg/dL — ABNORMAL HIGH (ref 6–20)
CALCIUM: 8.4 mg/dL — AB (ref 8.9–10.3)
CO2: 28 mmol/L (ref 22–32)
Chloride: 100 mmol/L — ABNORMAL LOW (ref 101–111)
Creatinine, Ser: 1.15 mg/dL (ref 0.61–1.24)
GFR, EST NON AFRICAN AMERICAN: 54 mL/min — AB (ref >60)
GLUCOSE: 97 mg/dL (ref 65–99)
Potassium: 4.2 mmol/L (ref 3.5–5.1)
Sodium: 134 mmol/L — ABNORMAL LOW (ref 135–145)

## 2015-07-22 LAB — CBC WITH DIFFERENTIAL/PLATELET
BASOS ABS: 0 10*3/uL (ref 0.0–0.1)
BASOS PCT: 0 %
EOS ABS: 0 10*3/uL (ref 0.0–0.7)
EOS PCT: 0 %
HCT: 34.8 % — ABNORMAL LOW (ref 39.0–52.0)
Hemoglobin: 11.1 g/dL — ABNORMAL LOW (ref 13.0–17.0)
Lymphocytes Relative: 10 %
Lymphs Abs: 0.5 10*3/uL — ABNORMAL LOW (ref 0.7–4.0)
MCH: 32.9 pg (ref 26.0–34.0)
MCHC: 31.9 g/dL (ref 30.0–36.0)
MCV: 103.3 fL — AB (ref 78.0–100.0)
MONO ABS: 0.5 10*3/uL (ref 0.1–1.0)
Monocytes Relative: 10 %
Neutro Abs: 4.2 10*3/uL (ref 1.7–7.7)
Neutrophils Relative %: 80 %
PLATELETS: 131 10*3/uL — AB (ref 150–400)
RBC: 3.37 MIL/uL — ABNORMAL LOW (ref 4.22–5.81)
RDW: 12.2 % (ref 11.5–15.5)
WBC: 5.2 10*3/uL (ref 4.0–10.5)

## 2015-07-22 MED ORDER — TETANUS-DIPHTH-ACELL PERTUSSIS 5-2.5-18.5 LF-MCG/0.5 IM SUSP
0.5000 mL | Freq: Once | INTRAMUSCULAR | Status: AC
  Administered 2015-07-22: 0.5 mL via INTRAMUSCULAR
  Filled 2015-07-22: qty 0.5

## 2015-07-22 NOTE — ED Notes (Signed)
Placed dressing on Pt. R eye according to EDP orders.  Pt. Tolerated well.  Daughter of pt. Given instructions on care for Pt.

## 2015-07-22 NOTE — ED Notes (Signed)
Pt daughter here to see pt.

## 2015-07-22 NOTE — ED Notes (Signed)
Patient assisted with changing into a gown. 

## 2015-07-22 NOTE — ED Notes (Signed)
PT daughter called per his request, left message.
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
## 2015-07-22 NOTE — ED Notes (Signed)
Pt arrived via EMS with c/o falling over walker fell to carpeted floor. Abrasion to right eye, back of right hand, and c/o pain below right knee.

## 2015-07-22 NOTE — Discharge Instructions (Signed)
Abrasion An abrasion is a cut or scrape on the outer surface of your skin. An abrasion does not extend through all of the layers of your skin. It is important to care for your abrasion properly to prevent infection. CAUSES Most abrasions are caused by falling on or gliding across the ground or another surface. When your skin rubs on something, the outer and inner layer of skin rubs off.  SYMPTOMS A cut or scrape is the main symptom of this condition. The scrape may be bleeding, or it may appear red or pink. If there was an associated fall, there may be an underlying bruise. DIAGNOSIS An abrasion is diagnosed with a physical exam. TREATMENT Treatment for this condition depends on how large and deep the abrasion is. Usually, your abrasion will be cleaned with water and mild soap. This removes any dirt or debris that may be stuck. An antibiotic ointment may be applied to the abrasion to help prevent infection. A bandage (dressing) may be placed on the abrasion to keep it clean. You may also need a tetanus shot. HOME CARE INSTRUCTIONS Medicines  Take or apply medicines only as directed by your health care provider.  If you were prescribed an antibiotic ointment, finish all of it even if you start to feel better. Wound Care  Clean the wound with mild soap and water 2-3 times per day or as directed by your health care provider. Pat your wound dry with a clean towel. Do not rub it.  There are many different ways to close and cover a wound. Follow instructions from your health care provider about:  Wound care.  Dressing changes and removal.  Check your wound every day for signs of infection. Watch for:  Redness, swelling, or pain.  Fluid, blood, or pus. General Instructions  Keep the dressing dry as directed by your health care provider. Do not take baths, swim, use a hot tub, or do anything that would put your wound underwater until your health care provider approves.  If there is  swelling, raise (elevate) the injured area above the level of your heart while you are sitting or lying down.  Keep all follow-up visits as directed by your health care provider. This is important. SEEK MEDICAL CARE IF:  You received a tetanus shot and you have swelling, severe pain, redness, or bleeding at the injection site.  Your pain is not controlled with medicine.  You have increased redness, swelling, or pain at the site of your wound. SEEK IMMEDIATE MEDICAL CARE IF:  You have a red streak going away from your wound.  You have a fever.  You have fluid, blood, or pus coming from your wound.  You notice a bad smell coming from your wound or your dressing.   This information is not intended to replace advice given to you by your health care provider. Make sure you discuss any questions you have with your health care provider.   Document Released: 04/28/2005 Document Revised: 04/09/2015 Document Reviewed: 07/17/2014 Elsevier Interactive Patient Education 2016 [place] Taking care of your wound properly can help to prevent pain and infection. It can also help your wound to heal more quickly.  HOW TO CARE FOR YOUR WOUND  Take or apply over-the-counter and prescription medicines only as told by your health care provider.  If you were prescribed antibiotic medicine, take or apply it as told by your health care provider. Do not stop using the antibiotic even if your condition improves.  Clean the wound each day or as told by your health care provider.  Wash the wound with mild soap and water.  Rinse the wound with water to remove all soap.  Pat the wound dry with a clean towel. Do not rub it.  There are many different ways to close and cover a wound. For example, a wound can be covered with stitches (sutures), skin glue, or adhesive strips. Follow instructions from your health care provider about:  How to take care of your wound.  When and how you should  change your bandage (dressing).  When you should remove your dressing.  Removing whatever was used to close your wound.  Check your wound every day for signs of infection. Watch for:  Redness, swelling, or pain.  Fluid, blood, or pus.  Keep the dressing dry until your health care provider says it can be removed. Do not take baths, swim, use a hot tub, or do anything that would put your wound underwater until your health care provider approves.  Raise (elevate) the injured area above the level of your heart while you are sitting or lying down.  Do not scratch or pick at the wound.  Keep all follow-up visits as told by your health care provider. This is important. SEEK MEDICAL CARE IF:  You received a tetanus shot and you have swelling, severe pain, redness, or bleeding at the injection site.  You have a fever.  Your pain is not controlled with medicine.  You have increased redness, swelling, or pain at the site of your wound.  You have fluid, blood, or pus coming from your wound.  You notice a bad smell coming from your wound or your dressing. SEEK IMMEDIATE MEDICAL CARE IF:  You have a red streak going away from your wound.   This information is not intended to replace advice given to you by your health care provider. Make sure you discuss any questions you have with your health care provider.   Document Released: 04/27/2008 Document Revised: 12/03/2014 Document Reviewed: 07/15/2014 Elsevier Interactive Patient Education [employer].

## 2015-07-22 NOTE — ED Notes (Signed)
Patient transported to radiology department via stretcher.

## 2015-07-22 NOTE — ED Notes (Signed)
Wounds cleaned with sterile saline and gauge. Tolerated well.

## 2015-07-25 ENCOUNTER — Encounter: Payer: Self-pay | Admitting: Family Medicine

## 2015-07-25 ENCOUNTER — Other Ambulatory Visit: Payer: Self-pay | Admitting: Family Medicine

## 2015-07-25 MED ORDER — DM-GUAIFENESIN ER 30-600 MG PO TB12: 2.0000 | ORAL_TABLET | Freq: Two times a day (BID) | ORAL | Status: DC | PRN

## 2015-08-14 ENCOUNTER — Inpatient Hospital Stay (HOSPITAL_BASED_OUTPATIENT_CLINIC_OR_DEPARTMENT_OTHER)
Admission: EM | Admit: 2015-08-14 | Discharge: 2015-08-21 | DRG: 389 | Disposition: A | Discharge status: Skilled Nursing Facility | Payer: Medicare Other | Attending: Internal Medicine | Admitting: Internal Medicine

## 2015-08-14 ENCOUNTER — Emergency Department (HOSPITAL_BASED_OUTPATIENT_CLINIC_OR_DEPARTMENT_OTHER): Payer: Medicare Other

## 2015-08-14 ENCOUNTER — Encounter (HOSPITAL_BASED_OUTPATIENT_CLINIC_OR_DEPARTMENT_OTHER): Payer: Self-pay | Admitting: *Deleted

## 2015-08-14 DIAGNOSIS — E7800 Pure hypercholesterolemia, unspecified: Secondary | ICD-10-CM | POA: Diagnosis present

## 2015-08-14 DIAGNOSIS — E878 Other disorders of electrolyte and fluid balance, not elsewhere classified: Secondary | ICD-10-CM | POA: Diagnosis present

## 2015-08-14 DIAGNOSIS — Z8249 Family history of ischemic heart disease and other diseases of the circulatory system: Secondary | ICD-10-CM

## 2015-08-14 DIAGNOSIS — Z66 Do not resuscitate: Secondary | ICD-10-CM | POA: Diagnosis not present

## 2015-08-14 DIAGNOSIS — D638 Anemia in other chronic diseases classified elsewhere: Secondary | ICD-10-CM | POA: Diagnosis present

## 2015-08-14 DIAGNOSIS — E870 Hyperosmolality and hypernatremia: Secondary | ICD-10-CM | POA: Diagnosis present

## 2015-08-14 DIAGNOSIS — N400 Enlarged prostate without lower urinary tract symptoms: Secondary | ICD-10-CM | POA: Diagnosis present

## 2015-08-14 DIAGNOSIS — R296 Repeated falls: Secondary | ICD-10-CM | POA: Diagnosis present

## 2015-08-14 DIAGNOSIS — Z7982 Long term (current) use of aspirin: Secondary | ICD-10-CM

## 2015-08-14 DIAGNOSIS — Z781 Physical restraint status: Secondary | ICD-10-CM

## 2015-08-14 DIAGNOSIS — Z79899 Other long term (current) drug therapy: Secondary | ICD-10-CM | POA: Diagnosis not present

## 2015-08-14 DIAGNOSIS — I482 Chronic atrial fibrillation: Secondary | ICD-10-CM | POA: Diagnosis present

## 2015-08-14 DIAGNOSIS — H9190 Unspecified hearing loss, unspecified ear: Secondary | ICD-10-CM | POA: Diagnosis present

## 2015-08-14 DIAGNOSIS — R634 Abnormal weight loss: Secondary | ICD-10-CM | POA: Diagnosis not present

## 2015-08-14 DIAGNOSIS — H409 Unspecified glaucoma: Secondary | ICD-10-CM | POA: Diagnosis present

## 2015-08-14 DIAGNOSIS — D7589 Other specified diseases of blood and blood-forming organs: Secondary | ICD-10-CM | POA: Diagnosis present

## 2015-08-14 DIAGNOSIS — I495 Sick sinus syndrome: Secondary | ICD-10-CM | POA: Diagnosis present

## 2015-08-14 DIAGNOSIS — R410 Disorientation, unspecified: Secondary | ICD-10-CM | POA: Diagnosis present

## 2015-08-14 DIAGNOSIS — I481 Persistent atrial fibrillation: Secondary | ICD-10-CM | POA: Diagnosis not present

## 2015-08-14 DIAGNOSIS — Z950 Presence of cardiac pacemaker: Secondary | ICD-10-CM | POA: Diagnosis not present

## 2015-08-14 DIAGNOSIS — E876 Hypokalemia: Secondary | ICD-10-CM | POA: Diagnosis present

## 2015-08-14 DIAGNOSIS — R112 Nausea with vomiting, unspecified: Secondary | ICD-10-CM | POA: Diagnosis present

## 2015-08-14 DIAGNOSIS — K5669 Other intestinal obstruction: Secondary | ICD-10-CM | POA: Diagnosis not present

## 2015-08-14 DIAGNOSIS — F0390 Unspecified dementia without behavioral disturbance: Secondary | ICD-10-CM | POA: Diagnosis not present

## 2015-08-14 DIAGNOSIS — Z886 Allergy status to analgesic agent status: Secondary | ICD-10-CM | POA: Diagnosis not present

## 2015-08-14 DIAGNOSIS — E785 Hyperlipidemia, unspecified: Secondary | ICD-10-CM | POA: Diagnosis present

## 2015-08-14 DIAGNOSIS — Z515 Encounter for palliative care: Secondary | ICD-10-CM | POA: Diagnosis not present

## 2015-08-14 DIAGNOSIS — D696 Thrombocytopenia, unspecified: Secondary | ICD-10-CM | POA: Diagnosis present

## 2015-08-14 DIAGNOSIS — K565 Intestinal adhesions [bands] with obstruction (postprocedural) (postinfection): Secondary | ICD-10-CM | POA: Diagnosis not present

## 2015-08-14 DIAGNOSIS — Z87891 Personal history of nicotine dependence: Secondary | ICD-10-CM

## 2015-08-14 DIAGNOSIS — R627 Adult failure to thrive: Secondary | ICD-10-CM | POA: Diagnosis not present

## 2015-08-14 DIAGNOSIS — K56609 Unspecified intestinal obstruction, unspecified as to partial versus complete obstruction: Secondary | ICD-10-CM

## 2015-08-14 LAB — COMPREHENSIVE METABOLIC PANEL
ALBUMIN: 4.1 g/dL (ref 3.5–5.0)
ALT: 16 U/L — AB (ref 17–63)
AST: 30 U/L (ref 15–41)
Alkaline Phosphatase: 48 U/L (ref 38–126)
Anion gap: 11 (ref 5–15)
BUN: 34 mg/dL — AB (ref 6–20)
CHLORIDE: 97 mmol/L — AB (ref 101–111)
CO2: 32 mmol/L (ref 22–32)
Calcium: 9.2 mg/dL (ref 8.9–10.3)
Creatinine, Ser: 1.41 mg/dL — ABNORMAL HIGH (ref 0.61–1.24)
GFR calc Af Amer: 49 mL/min — ABNORMAL LOW (ref >60)
GFR calc non Af Amer: 42 mL/min — ABNORMAL LOW (ref >60)
GLUCOSE: 176 mg/dL — AB (ref 65–99)
POTASSIUM: 3.9 mmol/L (ref 3.5–5.1)
Sodium: 140 mmol/L (ref 135–145)
Total Bilirubin: 0.8 mg/dL (ref 0.3–1.2)
Total Protein: 7.3 g/dL (ref 6.5–8.1)

## 2015-08-14 LAB — CBC WITH DIFFERENTIAL/PLATELET
Basophils Absolute: 0 10*3/uL (ref 0.0–0.1)
Basophils Relative: 0 %
EOS PCT: 0 %
Eosinophils Absolute: 0 10*3/uL (ref 0.0–0.7)
HCT: 41.4 % (ref 39.0–52.0)
Hemoglobin: 13.1 g/dL (ref 13.0–17.0)
LYMPHS ABS: 0.3 10*3/uL — AB (ref 0.7–4.0)
LYMPHS PCT: 4 %
MCH: 32.2 pg (ref 26.0–34.0)
MCHC: 31.6 g/dL (ref 30.0–36.0)
MCV: 101.7 fL — AB (ref 78.0–100.0)
MONO ABS: 0.4 10*3/uL (ref 0.1–1.0)
MONOS PCT: 7 %
Neutro Abs: 5.6 10*3/uL (ref 1.7–7.7)
Neutrophils Relative %: 89 %
PLATELETS: 150 10*3/uL (ref 150–400)
RBC: 4.07 MIL/uL — AB (ref 4.22–5.81)
RDW: 13.3 % (ref 11.5–15.5)
WBC: 6.3 10*3/uL (ref 4.0–10.5)

## 2015-08-14 LAB — I-STAT CG4 LACTIC ACID, ED: Lactic Acid, Venous: 2.07 mmol/L (ref 0.5–2.0)

## 2015-08-14 LAB — LIPASE, BLOOD: Lipase: 21 U/L (ref 11–51)

## 2015-08-14 MED ORDER — ONDANSETRON HCL 4 MG/2ML IJ SOLN
4.0000 mg | Freq: Once | INTRAMUSCULAR | Status: AC
  Administered 2015-08-14: 4 mg via INTRAVENOUS
  Filled 2015-08-14: qty 2

## 2015-08-14 MED ORDER — SODIUM CHLORIDE 0.9 % IV SOLN
INTRAVENOUS | Status: DC
  Administered 2015-08-14 – 2015-08-17 (×3): via INTRAVENOUS

## 2015-08-14 MED ORDER — PROMETHAZINE HCL 25 MG/ML IJ SOLN
12.5000 mg | Freq: Once | INTRAMUSCULAR | Status: AC
  Administered 2015-08-14: 12.5 mg via INTRAVENOUS
  Filled 2015-08-14: qty 1

## 2015-08-14 MED ORDER — IOHEXOL 300 MG/ML  SOLN
75.0000 mL | Freq: Once | INTRAMUSCULAR | Status: AC | PRN
  Administered 2015-08-14: 75 mL via INTRAVENOUS

## 2015-08-14 MED ORDER — IOHEXOL 300 MG/ML  SOLN
25.0000 mL | Freq: Once | INTRAMUSCULAR | Status: AC | PRN
  Administered 2015-08-14: 25 mL via ORAL

## 2015-08-14 NOTE — ED Notes (Signed)
Pt has vomited small amount 2-3 times since returned from ct

## 2015-08-14 NOTE — ED Provider Notes (Signed)
CSN: [id number]     Arrival date & time 08/14/15  1725 History   By signing my name below, I, Forrestine Him, attest that this documentation has been prepared under the direction and in the presence of Leonard Schwartz, MD.  Electronically Signed: Forrestine Him, ED Scribe. 08/14/2015. 6:36 PM.   Chief Complaint  Patient presents with  . Abdominal Pain   The history is provided by a relative. No language interpreter was used.    HPI Comments: [name] is a 82 y.o. male with a PMHx of hyperlipidemia, A-Fib, dementia, and bowel obstruction who presents to the Emergency Department complaining of intermittent, ongoing, severe upper abdominal pain x 3 days. Daughter also reports associated nausea, diarrhea, and vomiting. 1 episode of vomiting reported since arrival time. No aggravating or alleviating factors at this time. No OTC medications or home remedies attempted prior to arrival. No recent fever, chills, chest pain, or shortness of breath. PSHx includes abdominal surgery 1986 and several hernia repairs.  PCP: Odette Fraction, MD    Past Medical History  Diagnosis Date  . Hyperlipidemia   . Atrial fibrillation (HCC)     permanent afib  . Long-term (current) use of anticoagulants     previously on coumadin, stopped by Dr [name] due to frequent falls  . BRADYCARDIA-TACHYCARDIA SYNDROME 2007    s/p PPM by Dr [name]  . BPH (benign prostatic hyperplasia)   . Glaucoma   . Pure hypercholesterolemia   . DNR (do not resuscitate)   . Dementia    Past Surgical History  Procedure Laterality Date  . Abdominal surgery  1986  . Hernia repair    . Pacemaker insertion  01/28/06; 08-07-13    PPM  (MDT) implanted by Dr [name] 2007; MDT [id number] gen change by Dr Rayann Heman 08/2013  . Permanent pacemaker generator change N/A 08/07/2013    Procedure: PERMANENT PACEMAKER GENERATOR CHANGE;  Surgeon: Coralyn Mark, MD;  Location: [facility];  Service: Cardiovascular;  Laterality: N/A;   Family History   Problem Relation Age of Onset  . Heart attack Father    Social History  Substance Use Topics  . Smoking status: Former Smoker -- 1.00 packs/day for 25 years    Types: Cigarettes    Quit date: 08/02/1961  . Smokeless tobacco: Never Used  . Alcohol Use: No    Review of Systems   A complete 10 system review of systems was obtained and all systems are negative except as noted in the HPI and PMH.    Allergies  Tramadol  Home Medications   Prior to Admission medications   Medication Sig Start Date End Date Taking? Authorizing Provider  acetaminophen (TYLENOL) 325 MG tablet Take 650 mg by mouth See admin instructions. Take 2 tablets (650 mg) 2 times daily, may take 2 more tablets mid-day as needed for pain    Historical Provider, MD  aspirin EC 81 MG tablet Take 81 mg by mouth daily.    Historical Provider, MD  dextromethorphan-guaiFENesin (MUCINEX DM) 30-600 MG 12hr tablet Take 2 tablets by mouth 2 (two) times daily as needed for cough. 07/25/15   Susy Frizzle, MD  digoxin (LANOXIN) 0.125 MG tablet Take 1 tablet (125 mcg total) by mouth daily. 02/05/15   Burtis Junes, NP  loteprednol (LOTEMAX) 0.5 % ophthalmic suspension Place 1 drop into both eyes daily as needed (itching eyes).     Historical Provider, MD  metoprolol succinate (TOPROL-XL) 25 MG 24 hr tablet TAKE 1 TABLET (  25 MG TOTAL) BY MOUTH DAILY. TAKE WITH OR IMMEDIATELY FOLLOWING A MEAL. 04/10/15   [name], MD  polyethylene glycol (MIRALAX / GLYCOLAX) packet Take 17 g by mouth daily as needed (constipation).     Historical Provider, MD  simvastatin (ZOCOR) 40 MG tablet TAKE 1 TABLET BY MOUTH AT BEDTIME 06/05/15   Susy Frizzle, MD  tamsulosin (FLOMAX) 0.4 MG CAPS capsule Take 0.4 mg by mouth at bedtime.    Historical Provider, MD   Triage Vitals: BP 152/87 mmHg  Pulse 89  Temp(Src) 98.6 F (37 C) (Oral)  Resp 18  SpO2 97%   Physical Exam  Constitutional: He is oriented to person, place, and time. He appears  well-developed and well-nourished. No distress.  HENT:  Head: Normocephalic and atraumatic.  Eyes: Pupils are equal, round, and reactive to light.  Neck: Normal range of motion.  Cardiovascular: Normal rate and intact distal pulses.   Pulmonary/Chest: No respiratory distress.  Abdominal: Normal appearance. He exhibits no distension. There is generalized tenderness (Mild). There is no rebound and no guarding.  Musculoskeletal: Normal range of motion.  Neurological: He is alert and oriented to person, place, and time. No cranial nerve deficit.  Skin: Skin is warm and dry. No rash noted.  Psychiatric: He has a normal mood and affect. His behavior is normal.  Nursing note and vitals reviewed.   ED Course  Procedures (including critical care time)  DIAGNOSTIC STUDIES: Oxygen Saturation is 100% on RA, Normal by my interpretation.    COORDINATION OF CARE: 6:30 PM- Will give fluids and Zofran. Will order CMP, CBC, Lipase, CT abdomen pelvis with contrast, and Lactic acid. Discussed treatment plan with pt at bedside and pt agreed to plan.     Labs Review Labs Reviewed  COMPREHENSIVE METABOLIC PANEL - Abnormal; Notable for the following:    Chloride 97 (*)    Glucose, Bld 176 (*)    BUN 34 (*)    Creatinine, Ser 1.41 (*)    ALT 16 (*)    GFR calc non Af Amer 42 (*)    GFR calc Af Amer 49 (*)    All other components within normal limits  CBC WITH DIFFERENTIAL/PLATELET - Abnormal; Notable for the following:    RBC 4.07 (*)    MCV 101.7 (*)    Lymphs Abs 0.3 (*)    All other components within normal limits  I-STAT CG4 LACTIC ACID, ED - Abnormal; Notable for the following:    Lactic Acid, Venous 2.07 (*)    All other components within normal limits  LIPASE, BLOOD  LACTIC ACID, PLASMA    Imaging Review Ct Abdomen Pelvis W Contrast  08/14/2015  CLINICAL DATA:  82 year old male with generalized abdominal pain for 3 days. Nausea and vomiting. EXAM: CT ABDOMEN AND PELVIS WITH CONTRAST  TECHNIQUE: Multidetector CT imaging of the abdomen and pelvis was performed using the standard protocol following bolus administration of intravenous contrast. CONTRAST:  2mL OMNIPAQUE IOHEXOL 300 MG/ML  SOLN COMPARISON:  CT 08/28/2012 FINDINGS: Lower chest: The included lung bases are clear. Pacemaker wires partially included. Heart upper limits normal in size. Liver: Unchanged tiny hypodensity in the right lobe, incompletely characterize but likely cyst. Suspicious lesion. Hepatobiliary: Gallbladder physiologically distended. Calcified gallstones are seen. No biliary dilatation. Pancreas: Atrophic without ductal dilatation or surrounding inflammation. Spleen: Normal. Adrenal glands: No nodule. Kidneys: Symmetric renal enhancement. No hydronephrosis. There is a 1.9 cm simple cyst in the posterior interpolar right kidney. Trace of perinephric fluid  is unchanged. Stomach/Bowel: Stomach markedly distended with fluid. Fluid in the distal esophagus. Small bowel loops are diffusely dilated and fluid-filled. Transition point in the right mid abdomen, best appreciated on coronal image 27. Small bowel loops distal to this are decompressed. There is adjacent mesenteric edema and small amount of free fluid. No pneumatosis. No free air. Small bowel loops in the right lower quadrant extend towards the right inguinal canal, unchanged in appearance from prior exam. Moderate stool burden throughout the colon. Appendix tentatively identified and normal. Vascular/Lymphatic: No retroperitoneal adenopathy. Abdominal aorta is normal in caliber. Moderate atherosclerosis without aneurysm. Reproductive: Prostate gland prominent size. Bladder: Distended without wall thickening. Other: Small amount of free fluid in the pelvis is likely reactive. There is no free air. Probable previous right inguinal hernia repair. Musculoskeletal: Multilevel degenerative change throughout the lumbar spine There are no acute or suspicious osseous  abnormalities. IMPRESSION: Small-bowel obstruction with transition point in the right mid abdomen. This is similar in location to prior exam, likely secondary to adhesions. Mesenteric edema and small amount of free fluid, no pneumatosis or perforation. Electronically Signed   By: Jeb Levering M.D.   On: 08/14/2015 20:40   I have personally reviewed and evaluated these images and lab results as part of my medical decision-making.  I discussed with Dr. Lucia Gaskins of general surgery.  Patient to be admitted to the hospitalist service with bowel rest and IV fluids.  Surgery is to be consult when patient reaches hospital.  MDM   Final diagnoses:  SBO (small bowel obstruction) ([place])   I personally performed the services described in this documentation, which was scribed in my presence. The recorded information has been reviewed and considered.   Leonard Schwartz, MD 08/14/15 2253

## 2015-08-14 NOTE — ED Notes (Signed)
Pt to triage in w/c, reporting 3 days of abd pain with vomiting and diarrhea.

## 2015-08-15 ENCOUNTER — Encounter (HOSPITAL_COMMUNITY): Payer: Self-pay | Admitting: General Surgery

## 2015-08-15 DIAGNOSIS — K565 Intestinal adhesions [bands] with obstruction (postprocedural) (postinfection): Principal | ICD-10-CM

## 2015-08-15 LAB — CBC WITH DIFFERENTIAL/PLATELET
Basophils Absolute: 0 10*3/uL (ref 0.0–0.1)
Basophils Relative: 0 %
EOS ABS: 0 10*3/uL (ref 0.0–0.7)
EOS PCT: 0 %
HCT: 37.1 % — ABNORMAL LOW (ref 39.0–52.0)
Hemoglobin: 11.8 g/dL — ABNORMAL LOW (ref 13.0–17.0)
LYMPHS ABS: 0.2 10*3/uL — AB (ref 0.7–4.0)
Lymphocytes Relative: 5 %
MCH: 33.1 pg (ref 26.0–34.0)
MCHC: 31.8 g/dL (ref 30.0–36.0)
MCV: 103.9 fL — ABNORMAL HIGH (ref 78.0–100.0)
MONOS PCT: 15 %
Monocytes Absolute: 0.6 10*3/uL (ref 0.1–1.0)
Neutro Abs: 3.4 10*3/uL (ref 1.7–7.7)
Neutrophils Relative %: 80 %
PLATELETS: 132 10*3/uL — AB (ref 150–400)
RBC: 3.57 MIL/uL — ABNORMAL LOW (ref 4.22–5.81)
RDW: 13.8 % (ref 11.5–15.5)
WBC: 4.2 10*3/uL (ref 4.0–10.5)

## 2015-08-15 LAB — COMPREHENSIVE METABOLIC PANEL
ALK PHOS: 46 U/L (ref 38–126)
ALT: 12 U/L — AB (ref 17–63)
AST: 22 U/L (ref 15–41)
Albumin: 3.9 g/dL (ref 3.5–5.0)
Anion gap: 13 (ref 5–15)
BUN: 35 mg/dL — AB (ref 6–20)
CHLORIDE: 100 mmol/L — AB (ref 101–111)
CO2: 28 mmol/L (ref 22–32)
CREATININE: 1.27 mg/dL — AB (ref 0.61–1.24)
Calcium: 8.8 mg/dL — ABNORMAL LOW (ref 8.9–10.3)
GFR calc Af Amer: 55 mL/min — ABNORMAL LOW (ref >60)
GFR, EST NON AFRICAN AMERICAN: 48 mL/min — AB (ref >60)
Glucose, Bld: 141 mg/dL — ABNORMAL HIGH (ref 65–99)
Potassium: 3.7 mmol/L (ref 3.5–5.1)
SODIUM: 141 mmol/L (ref 135–145)
Total Bilirubin: 0.8 mg/dL (ref 0.3–1.2)
Total Protein: 6.5 g/dL (ref 6.5–8.1)

## 2015-08-15 LAB — LACTIC ACID, PLASMA: LACTIC ACID, VENOUS: 1 mmol/L (ref 0.5–2.0)

## 2015-08-15 MED ORDER — ENOXAPARIN SODIUM 30 MG/0.3ML ~~LOC~~ SOLN
30.0000 mg | SUBCUTANEOUS | Status: DC
  Administered 2015-08-15: 30 mg via SUBCUTANEOUS
  Filled 2015-08-15: qty 0.3

## 2015-08-15 MED ORDER — ACETAMINOPHEN 325 MG PO TABS: 650.0000 mg | ORAL_TABLET | Freq: Four times a day (QID) | ORAL | Status: DC | PRN

## 2015-08-15 MED ORDER — ENOXAPARIN SODIUM 40 MG/0.4ML ~~LOC~~ SOLN: 40.0000 mg | SUBCUTANEOUS | Status: DC

## 2015-08-15 MED ORDER — ONDANSETRON HCL 4 MG PO TABS: 4.0000 mg | ORAL_TABLET | Freq: Four times a day (QID) | ORAL | Status: DC | PRN

## 2015-08-15 MED ORDER — FAMOTIDINE IN NACL 20-0.9 MG/50ML-% IV SOLN
20.0000 mg | Freq: Two times a day (BID) | INTRAVENOUS | Status: DC
  Administered 2015-08-15 – 2015-08-21 (×14): 20 mg via INTRAVENOUS
  Filled 2015-08-15 (×17): qty 50

## 2015-08-15 MED ORDER — SODIUM CHLORIDE 0.9 % IV SOLN: INTRAVENOUS | Status: DC

## 2015-08-15 MED ORDER — LABETALOL HCL 5 MG/ML IV SOLN
10.0000 mg | INTRAVENOUS | Status: DC | PRN
Start: 2015-08-15 — End: 2015-08-21
  Filled 2015-08-15: qty 4

## 2015-08-15 MED ORDER — PANTOPRAZOLE SODIUM 40 MG IV SOLR
40.0000 mg | Freq: Two times a day (BID) | INTRAVENOUS | Status: DC
  Administered 2015-08-15 – 2015-08-21 (×12): 40 mg via INTRAVENOUS
  Filled 2015-08-15 (×13): qty 40

## 2015-08-15 MED ORDER — METOPROLOL SUCCINATE ER 25 MG PO TB24
25.0000 mg | ORAL_TABLET | Freq: Every day | ORAL | Status: DC
  Filled 2015-08-15: qty 1

## 2015-08-15 MED ORDER — ACETAMINOPHEN 650 MG RE SUPP
650.0000 mg | Freq: Four times a day (QID) | RECTAL | Status: DC | PRN
  Administered 2015-08-20: 650 mg via RECTAL
  Filled 2015-08-15: qty 1

## 2015-08-15 MED ORDER — TAMSULOSIN HCL 0.4 MG PO CAPS
0.4000 mg | ORAL_CAPSULE | Freq: Every day | ORAL | Status: DC
  Administered 2015-08-16 – 2015-08-19 (×4): 0.4 mg via ORAL
  Filled 2015-08-15 (×7): qty 1

## 2015-08-15 MED ORDER — HYDROMORPHONE HCL 1 MG/ML IJ SOLN
0.5000 mg | INTRAMUSCULAR | Status: DC | PRN
  Administered 2015-08-15 – 2015-08-19 (×4): 0.5 mg via INTRAVENOUS
  Filled 2015-08-15 (×4): qty 1

## 2015-08-15 MED ORDER — LOTEPREDNOL ETABONATE 0.5 % OP SUSP
1.0000 [drp] | Freq: Every day | OPHTHALMIC | Status: DC | PRN
  Filled 2015-08-15: qty 5

## 2015-08-15 MED ORDER — ONDANSETRON HCL 4 MG/2ML IJ SOLN: 4.0000 mg | Freq: Four times a day (QID) | INTRAMUSCULAR | Status: DC | PRN

## 2015-08-15 MED ORDER — DIGOXIN 0.25 MG/ML IJ SOLN
0.1250 mg | Freq: Every day | INTRAMUSCULAR | Status: DC
  Administered 2015-08-15 – 2015-08-20 (×6): 0.125 mg via INTRAVENOUS
  Administered 2015-08-21: 10:00:00 via INTRAVENOUS
  Filled 2015-08-15 (×7): qty 0.5

## 2015-08-15 MED ORDER — ASPIRIN EC 81 MG PO TBEC
81.0000 mg | DELAYED_RELEASE_TABLET | Freq: Every day | ORAL | Status: DC
  Filled 2015-08-15: qty 1

## 2015-08-15 MED ORDER — ONDANSETRON HCL 4 MG/2ML IJ SOLN: 4.0000 mg | Freq: Three times a day (TID) | INTRAMUSCULAR | Status: AC | PRN

## 2015-08-15 MED ORDER — CHLORHEXIDINE GLUCONATE 0.12% ORAL RINSE (MEDLINE KIT)
15.0000 mL | Freq: Four times a day (QID) | OROMUCOSAL | Status: DC
  Administered 2015-08-16 – 2015-08-21 (×13): 15 mL via OROMUCOSAL

## 2015-08-15 MED ORDER — METOPROLOL TARTRATE 1 MG/ML IV SOLN
5.0000 mg | Freq: Four times a day (QID) | INTRAVENOUS | Status: DC
  Administered 2015-08-15 – 2015-08-21 (×24): 5 mg via INTRAVENOUS
  Filled 2015-08-15 (×28): qty 5

## 2015-08-15 MED ORDER — DIGOXIN 125 MCG PO TABS
125.0000 ug | ORAL_TABLET | Freq: Every day | ORAL | Status: DC
  Filled 2015-08-15: qty 1

## 2015-08-15 NOTE — Progress Notes (Signed)
Pt seen and examined 89/M with Chronic Afib, Cognitive dysfunction(likely Dementia), h/o SBO, admitted this am with SBO, pls see Dr.Carter's note for details 1. SBO-due to adhesions, place NG this am to ILWS -IVF, NPO, supportive care -per [facility] 2. Permanent Afib -rate controlled, change BB and Digoxin to IV -not on anticoagulation, s/p PPM for tachy-brady syndrome 3. 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

## 2015-08-15 NOTE — Progress Notes (Signed)
Mittens and soft wrist restraints ordered for patient removing IV lines, physically fighting, pulling out NG tubes. Have monitored patient more frequent than every 2 hours, will continue to monitor.

## 2015-08-15 NOTE — Progress Notes (Signed)
Blood tinge noted in NG tube. Flushed NG with 162mL of saline. Noted Dr.Joseph, to hold lovenox and start protonix. Will continue to monitor. Also made MD aware of decreased urinary output.

## 2015-08-15 NOTE — Progress Notes (Signed)
Daughter at bedside, reports that patient has a restraining order on his son, the son is only allowed to visit patient when the Webb Silversmith, the daughter, is present. Copy of restraining order placed on chart. Picture of patients son at front desk.

## 2015-08-15 NOTE — Consult Note (Signed)
Reason for Consult: SBO  Referring Physician: Dr. Domenic Polite   HPI: [name] is a 82 year old male known to our service from 2014 admission for a small bowel obstruction.  Previous history include a laparotomy and a hernia repair, Afib s/p pacemaker and dementia. He presented to [facility] with sudden onset nausea, vomiting and abdominal pain.  The patient had a CT scan which showed a small bowel obstruction.  He was transferred for admission.  The patient does not have an NGT.  I was unable to get him to answer any questions for me.  At times, he would say yes or no, easily arousable, but chooses not to open eyes or speak.  Therefore, all history was obtained reviewing his medical record.    He did answer yes to having abdominal pain and nausea, has not had any episodes of emesis documented since admission.  Past Medical History  Diagnosis Date  . Hyperlipidemia   . Atrial fibrillation (HCC)     permanent afib  . Long-term (current) use of anticoagulants     previously on coumadin, stopped by Dr [name] due to frequent falls  . BRADYCARDIA-TACHYCARDIA SYNDROME 2007    s/p PPM by Dr [name]  . BPH (benign prostatic hyperplasia)   . Glaucoma   . Pure hypercholesterolemia   . DNR (do not resuscitate)   . Dementia     Past Surgical History  Procedure Laterality Date  . Abdominal surgery  1986  . Hernia repair    . Pacemaker insertion  01/28/06; 08-07-13    PPM  (MDT) implanted by Dr [name] 2007; MDT [id number] gen change by Dr Rayann Heman 08/2013  . Permanent pacemaker generator change N/A 08/07/2013    Procedure: PERMANENT PACEMAKER GENERATOR CHANGE;  Surgeon: Coralyn Mark, MD;  Location: [place] CATH LAB;  Service: Cardiovascular;  Laterality: N/A;    Family History  Problem Relation Age of Onset  . Heart attack Father     Social History:  reports that he quit smoking about 54 years ago. His smoking use included Cigarettes. He has a 25 pack-year smoking history. He has never used smokeless  tobacco. He reports that he does not drink alcohol or use illicit drugs.  Allergies:  Allergies  Allergen Reactions  . Tramadol Other (See Comments)    Extreme sleepiness and irritable    Medications:  Scheduled Meds: . aspirin EC  81 mg Oral Daily  . digoxin  125 mcg Oral Daily  . enoxaparin (LOVENOX) injection  30 mg Subcutaneous Q24H  . famotidine (PEPCID) IV  20 mg Intravenous Q12H  . metoprolol succinate  25 mg Oral Daily  . tamsulosin  0.4 mg Oral QHS   Continuous Infusions: . sodium chloride 125 mL/hr at 08/14/15 1845   PRN Meds:.acetaminophen **OR** acetaminophen, HYDROmorphone (DILAUDID) injection, labetalol, loteprednol, ondansetron (ZOFRAN) IV, ondansetron **OR** ondansetron (ZOFRAN) IV   Results for orders placed or performed during the hospital encounter of 08/14/15 (from the past 48 hour(s))  Comprehensive metabolic panel     Status: Abnormal   Collection Time: 08/14/15  6:50 PM  Result Value Ref Range   Sodium 140 135 - 145 mmol/L   Potassium 3.9 3.5 - 5.1 mmol/L   Chloride 97 (L) 101 - 111 mmol/L   CO2 32 22 - 32 mmol/L   Glucose, Bld 176 (H) 65 - 99 mg/dL   BUN 34 (H) 6 - 20 mg/dL   Creatinine, Ser 1.41 (H) 0.61 - 1.24 mg/dL   Calcium 9.2 8.9 -  10.3 mg/dL   Total Protein 7.3 6.5 - 8.1 g/dL   Albumin 4.1 3.5 - 5.0 g/dL   AST 30 15 - 41 U/L   ALT 16 (L) 17 - 63 U/L   Alkaline Phosphatase 48 38 - 126 U/L   Total Bilirubin 0.8 0.3 - 1.2 mg/dL   GFR calc non Af Amer 42 (L) >60 mL/min   GFR calc Af Amer 49 (L) >60 mL/min    Comment: (NOTE) The eGFR has been calculated using the CKD EPI equation. This calculation has not been validated in all clinical situations. eGFR's persistently <60 mL/min signify possible Chronic Kidney Disease.    Anion gap 11 5 - 15  CBC with Differential/Platelet     Status: Abnormal   Collection Time: 08/14/15  6:50 PM  Result Value Ref Range   WBC 6.3 4.0 - 10.5 K/uL   RBC 4.07 (L) 4.22 - 5.81 MIL/uL   Hemoglobin 13.1 13.0 -  17.0 g/dL   HCT 41.4 39.0 - 52.0 %   MCV 101.7 (H) 78.0 - 100.0 fL   MCH 32.2 26.0 - 34.0 pg   MCHC 31.6 30.0 - 36.0 g/dL   RDW 13.3 11.5 - 15.5 %   Platelets 150 150 - 400 K/uL   Neutrophils Relative % 89 %   Neutro Abs 5.6 1.7 - 7.7 K/uL   Lymphocytes Relative 4 %   Lymphs Abs 0.3 (L) 0.7 - 4.0 K/uL   Monocytes Relative 7 %   Monocytes Absolute 0.4 0.1 - 1.0 K/uL   Eosinophils Relative 0 %   Eosinophils Absolute 0.0 0.0 - 0.7 K/uL   Basophils Relative 0 %   Basophils Absolute 0.0 0.0 - 0.1 K/uL  Lipase, blood     Status: None   Collection Time: 08/14/15  6:50 PM  Result Value Ref Range   Lipase 21 11 - 51 U/L  I-Stat CG4 Lactic Acid, ED     Status: Abnormal   Collection Time: 08/14/15  6:58 PM  Result Value Ref Range   Lactic Acid, Venous 2.07 (HH) 0.5 - 2.0 mmol/L   Comment NOTIFIED PHYSICIAN   Comprehensive metabolic panel     Status: Abnormal   Collection Time: 08/15/15  5:40 AM  Result Value Ref Range   Sodium 141 135 - 145 mmol/L   Potassium 3.7 3.5 - 5.1 mmol/L   Chloride 100 (L) 101 - 111 mmol/L   CO2 28 22 - 32 mmol/L   Glucose, Bld 141 (H) 65 - 99 mg/dL   BUN 35 (H) 6 - 20 mg/dL   Creatinine, Ser 1.27 (H) 0.61 - 1.24 mg/dL   Calcium 8.8 (L) 8.9 - 10.3 mg/dL   Total Protein 6.5 6.5 - 8.1 g/dL   Albumin 3.9 3.5 - 5.0 g/dL   AST 22 15 - 41 U/L   ALT 12 (L) 17 - 63 U/L   Alkaline Phosphatase 46 38 - 126 U/L   Total Bilirubin 0.8 0.3 - 1.2 mg/dL   GFR calc non Af Amer 48 (L) >60 mL/min   GFR calc Af Amer 55 (L) >60 mL/min    Comment: (NOTE) The eGFR has been calculated using the CKD EPI equation. This calculation has not been validated in all clinical situations. eGFR's persistently <60 mL/min signify possible Chronic Kidney Disease.    Anion gap 13 5 - 15  CBC WITH DIFFERENTIAL     Status: Abnormal   Collection Time: 08/15/15  5:40 AM  Result Value Ref Range  WBC 4.2 4.0 - 10.5 K/uL   RBC 3.57 (L) 4.22 - 5.81 MIL/uL   Hemoglobin 11.8 (L) 13.0 - 17.0  g/dL   HCT 37.1 (L) 39.0 - 52.0 %   MCV 103.9 (H) 78.0 - 100.0 fL   MCH 33.1 26.0 - 34.0 pg   MCHC 31.8 30.0 - 36.0 g/dL   RDW 13.8 11.5 - 15.5 %   Platelets 132 (L) 150 - 400 K/uL   Neutrophils Relative % 80 %   Neutro Abs 3.4 1.7 - 7.7 K/uL   Lymphocytes Relative 5 %   Lymphs Abs 0.2 (L) 0.7 - 4.0 K/uL   Monocytes Relative 15 %   Monocytes Absolute 0.6 0.1 - 1.0 K/uL   Eosinophils Relative 0 %   Eosinophils Absolute 0.0 0.0 - 0.7 K/uL   Basophils Relative 0 %   Basophils Absolute 0.0 0.0 - 0.1 K/uL  Lactic acid, plasma     Status: None   Collection Time: 08/15/15  5:40 AM  Result Value Ref Range   Lactic Acid, Venous 1.0 0.5 - 2.0 mmol/L    Ct Abdomen Pelvis W Contrast  08/14/2015  CLINICAL DATA:  82 year old male with generalized abdominal pain for 3 days. Nausea and vomiting. EXAM: CT ABDOMEN AND PELVIS WITH CONTRAST TECHNIQUE: Multidetector CT imaging of the abdomen and pelvis was performed using the standard protocol following bolus administration of intravenous contrast. CONTRAST:  68m OMNIPAQUE IOHEXOL 300 MG/ML  SOLN COMPARISON:  CT 08/28/2012 FINDINGS: Lower chest: The included lung bases are clear. Pacemaker wires partially included. Heart upper limits normal in size. Liver: Unchanged tiny hypodensity in the right lobe, incompletely characterize but likely cyst. Suspicious lesion. Hepatobiliary: Gallbladder physiologically distended. Calcified gallstones are seen. No biliary dilatation. Pancreas: Atrophic without ductal dilatation or surrounding inflammation. Spleen: Normal. Adrenal glands: No nodule. Kidneys: Symmetric renal enhancement. No hydronephrosis. There is a 1.9 cm simple cyst in the posterior interpolar right kidney. Trace of perinephric fluid is unchanged. Stomach/Bowel: Stomach markedly distended with fluid. Fluid in the distal esophagus. Small bowel loops are diffusely dilated and fluid-filled. Transition point in the right mid abdomen, best appreciated on coronal  image 27. Small bowel loops distal to this are decompressed. There is adjacent mesenteric edema and small amount of free fluid. No pneumatosis. No free air. Small bowel loops in the right lower quadrant extend towards the right inguinal canal, unchanged in appearance from prior exam. Moderate stool burden throughout the colon. Appendix tentatively identified and normal. Vascular/Lymphatic: No retroperitoneal adenopathy. Abdominal aorta is normal in caliber. Moderate atherosclerosis without aneurysm. Reproductive: Prostate gland prominent size. Bladder: Distended without wall thickening. Other: Small amount of free fluid in the pelvis is likely reactive. There is no free air. Probable previous right inguinal hernia repair. Musculoskeletal: Multilevel degenerative change throughout the lumbar spine There are no acute or suspicious osseous abnormalities. IMPRESSION: Small-bowel obstruction with transition point in the right mid abdomen. This is similar in location to prior exam, likely secondary to adhesions. Mesenteric edema and small amount of free fluid, no pneumatosis or perforation. Electronically Signed   By: MJeb LeveringM.D.   On: 08/14/2015 20:40    Review of Systems  Unable to perform ROS  Blood pressure 144/50, pulse 97, temperature 99.1 F (37.3 C), temperature source Axillary, resp. rate 14, height _0  (1.753 m), weight 62.143 kg (137 lb), SpO2 98 %. Physical Exam  Constitutional: Vital signs are normal. He appears cachectic. He is sleeping and uncooperative. He is easily aroused.  Non-toxic  appearance. No distress.  Cardiovascular:  S1S2 irregularly irregular.  2/6SEM. No edema.   Respiratory: Effort normal and breath sounds normal. No respiratory distress. He has no wheezes. He has no rales. He exhibits no tenderness.  GI: Soft. Bowel sounds are normal. He exhibits distension. He exhibits no mass. There is no rebound and no guarding.  Mild lower abdominal tenderness without guarding   Musculoskeletal: He exhibits no edema.  Neurological: He is easily aroused.  Skin: Skin is warm and dry.    Assessment/Plan: SBO-given gastric distention and nausea, place NGT to LIWS.  Will need mittens at the very least to prevent pulling at tubes. I would not recommend any additional sedatives given age and current level of sedation.  Will consider starting SBO protocol.  Start with AXR in AM and NGT first, antiemetics, IV hydration and pain control.  Hopefully the obstruction with resolve without sur  Update 10:28--NGT placed 475m brown feculent output.  Will place soft wrist restraints due to patient pulling at medical devices.   RErby PianANP-BC Pager [phone]/13/2017, 9:59 AM

## 2015-08-15 NOTE — Progress Notes (Signed)
Erin Sons nurse with the assisted living responsible for his care,called in regards to patient and provided her cell phone number [phone], she wants to be called if patient happens to be discharged over the weekend.

## 2015-08-15 NOTE — H&P (Signed)
[facility] History and Physical  [name] [id number] DOB: 07-Jul-1923 DOA: 08/14/2015  PCP: Odette Fraction, MD   Chief Complaint: Nausea, vomiting, abdominal pain  HPI: [name] is a 82 y.o. gentleman with a history of chronic atrial fibrillation (no anticoagulation due to history of falls, bleeding risk), glaucoma, and age-related cognitive impairment (his daughter denies a formal diagnosis of dementia) who is a resident of an assisted living facility.  The patient is drowsy after receiving IV phenergan prior to transfer from [facility], so his daughter gives his clinical history.  He was in his baseline state of health until last night, when he developed nausea and vomiting. Initially emesis looked like his last meal.  It subsequently turned brown.  His daughter denies hematemesis.  Symptoms persisted through the night and into today.  Initially, she thought he had a self-limited GI illness because "a bug has been going around".  However, when he was not better almost 24 hours later, she brought him in for further evaluation because he has a history of bowel obstruction.  ED evaluation in [facility] concerning for mild AKI and CT findings consistent with small bowel obstruction.  Hospitalist asked to admit; general surgery to consult.  No fevers, chills, or sweats.  No diarrhea.  Review of Systems: Limited review of systems negative except as stated in HPI.  Past Medical History  Diagnosis Date  . Hyperlipidemia   . Atrial fibrillation (HCC)     permanent afib  . Long-term (current) use of anticoagulants     previously on coumadin, stopped by Dr [name] due to frequent falls  . BRADYCARDIA-TACHYCARDIA SYNDROME 2007    s/p PPM by Dr [name]  . BPH (benign prostatic hyperplasia)   . Glaucoma   . Pure hypercholesterolemia   . DNR (do not resuscitate)   . Dementia    Past Surgical History  Procedure Laterality Date  . Abdominal surgery  1986  . Hernia  repair    . Pacemaker insertion  01/28/06; 08-07-13    PPM  (MDT) implanted by Dr [name] 2007; MDT [id number] gen change by Dr Rayann Heman 08/2013  . Permanent pacemaker generator change N/A 08/07/2013    Procedure: PERMANENT PACEMAKER GENERATOR CHANGE;  Surgeon: Coralyn Mark, MD;  Location: [place] CATH LAB;  Service: Cardiovascular;  Laterality: N/A;   Social History:  Social History   Social History Narrative  Remote tobacco use.  No EtOH or illicit drug use.  His wife is in rehab.  He has two children, but his daughter Darnelle Bos is his POA.  Allergies  Allergen Reactions  . Tramadol Other (See Comments)    Extreme sleepiness and irritable    Family History  Problem Relation Age of Onset  . Heart attack Father    Prior to Admission medications   Medication Sig Start Date End Date Taking? Authorizing Provider  acetaminophen (TYLENOL) 325 MG tablet Take 650 mg by mouth See admin instructions. Take 2 tablets (650 mg) 2 times daily, may take 2 more tablets mid-day as needed for pain   Yes Historical Provider, MD  aspirin EC 81 MG tablet Take 81 mg by mouth daily.   Yes Historical Provider, MD  digoxin (LANOXIN) 0.125 MG tablet Take 1 tablet (125 mcg total) by mouth daily. 02/05/15  Yes Burtis Junes, NP  loteprednol (LOTEMAX) 0.5 % ophthalmic suspension Place 1 drop into both eyes daily as needed (itching eyes).    Yes Historical Provider, MD  metoprolol succinate (TOPROL-XL)  25 MG 24 hr tablet TAKE 1 TABLET (25 MG TOTAL) BY MOUTH DAILY. TAKE WITH OR IMMEDIATELY FOLLOWING A MEAL. 04/10/15  Yes [name], MD  polyethylene glycol (MIRALAX / GLYCOLAX) packet Take 17 g by mouth daily as needed (constipation).    Yes Historical Provider, MD  simvastatin (ZOCOR) 40 MG tablet TAKE 1 TABLET BY MOUTH AT BEDTIME 06/05/15  Yes Susy Frizzle, MD  tamsulosin (FLOMAX) 0.4 MG CAPS capsule Take 0.4 mg by mouth at bedtime.   Yes Historical Provider, MD   Physical Exam: Filed Vitals:   08/14/15 1737 08/14/15 2113  08/15/15 0011  BP: 126/99 152/87 168/50  Pulse: 99 89 90  Temp: 98.6 F (37 C)  98.7 F (37.1 C)  TempSrc: Oral  Oral  Resp:  18 18  SpO2: 100% 97% 100%     General:  Sleeping but arousable.  NAD.  Chronically ill appearing.  Eyes: PERRL bilaterally  ENT: Mucous membranes are dry.  Neck: Very thin.   Cardiovascular: Irregularly irregular but rate controlled.  No edema.  Respiratory: CTA bilaterally.  GI: Abdomen remains soft and compressible, mild distention.  Bowel sounds are present.  Skin: Pale, cool, dry  Musculoskeletal: Moves all four extremities spontaneously.  Psychiatric: Normal affect.  Neurologic: No focal deficits.  Labs on Admission:  Basic Metabolic Panel:  Recent Labs Lab 08/14/15 1850  NA 140  K 3.9  CL 97*  CO2 32  GLUCOSE 176*  BUN 34*  CREATININE 1.41*  CALCIUM 9.2   Liver Function Tests:  Recent Labs Lab 08/14/15 1850  AST 30  ALT 16*  ALKPHOS 48  BILITOT 0.8  PROT 7.3  ALBUMIN 4.1    Recent Labs Lab 08/14/15 1850  LIPASE 21   CBC:  Recent Labs Lab 08/14/15 1850  WBC 6.3  NEUTROABS 5.6  HGB 13.1  HCT 41.4  MCV 101.7*  PLT 150   Radiological Exams on Admission: Ct Abdomen Pelvis W Contrast  08/14/2015  CLINICAL DATA:  82 year old male with generalized abdominal pain for 3 days. Nausea and vomiting. EXAM: CT ABDOMEN AND PELVIS WITH CONTRAST TECHNIQUE: Multidetector CT imaging of the abdomen and pelvis was performed using the standard protocol following bolus administration of intravenous contrast. CONTRAST:  66mL OMNIPAQUE IOHEXOL 300 MG/ML  SOLN COMPARISON:  CT 08/28/2012 FINDINGS: Lower chest: The included lung bases are clear. Pacemaker wires partially included. Heart upper limits normal in size. Liver: Unchanged tiny hypodensity in the right lobe, incompletely characterize but likely cyst. Suspicious lesion. Hepatobiliary: Gallbladder physiologically distended. Calcified gallstones are seen. No biliary dilatation.  Pancreas: Atrophic without ductal dilatation or surrounding inflammation. Spleen: Normal. Adrenal glands: No nodule. Kidneys: Symmetric renal enhancement. No hydronephrosis. There is a 1.9 cm simple cyst in the posterior interpolar right kidney. Trace of perinephric fluid is unchanged. Stomach/Bowel: Stomach markedly distended with fluid. Fluid in the distal esophagus. Small bowel loops are diffusely dilated and fluid-filled. Transition point in the right mid abdomen, best appreciated on coronal image 27. Small bowel loops distal to this are decompressed. There is adjacent mesenteric edema and small amount of free fluid. No pneumatosis. No free air. Small bowel loops in the right lower quadrant extend towards the right inguinal canal, unchanged in appearance from prior exam. Moderate stool burden throughout the colon. Appendix tentatively identified and normal. Vascular/Lymphatic: No retroperitoneal adenopathy. Abdominal aorta is normal in caliber. Moderate atherosclerosis without aneurysm. Reproductive: Prostate gland prominent size. Bladder: Distended without wall thickening. Other: Small amount of free fluid in the pelvis  is likely reactive. There is no free air. Probable previous right inguinal hernia repair. Musculoskeletal: Multilevel degenerative change throughout the lumbar spine There are no acute or suspicious osseous abnormalities. IMPRESSION: Small-bowel obstruction with transition point in the right mid abdomen. This is similar in location to prior exam, likely secondary to adhesions. Mesenteric edema and small amount of free fluid, no pneumatosis or perforation. Electronically Signed   By: Jeb Levering M.D.   On: 08/14/2015 20:40   Assessment/Plan Active Problems:   SBO (small bowel obstruction) (HCC)   Small bowel obstruction due to adhesions ([place])  1. Admit to med surg  2.  Small bowel obstruction --NPO except meds, holding vitamins and supplements for now.  Will continue  anti-hypertensives and rate control agents --IV fluid resuscitation with NS --Dr. Alphonsa Overall of general surgery notified of patient's arrival tonight.  Patient will be seen in the morning. --NG tube, if needed, for persistent nausea and vomiting --Anti-emetics and analgesics as needed --Previously, he improved with conservative management.  Daughter hoping he can avoid surgery again.  Code Status: DNR/DNI Family Communication: Daughter at bedside Disposition Plan: Expect him to be here at least two midnights  Time spent: 86 minutes  [employer] [facility]  08/15/2015, 2:42 AM

## 2015-08-15 NOTE — Progress Notes (Signed)
Pt not voiding but scant amount felt in depends diaper. Bladder scan showed >591. On call NP paged for in and out cath order. In and out cath done got out 525cc of urine. Will report off to day nurse.

## 2015-08-16 ENCOUNTER — Inpatient Hospital Stay (HOSPITAL_COMMUNITY): Payer: Medicare Other

## 2015-08-16 LAB — BASIC METABOLIC PANEL
ANION GAP: 10 (ref 5–15)
BUN: 35 mg/dL — ABNORMAL HIGH (ref 6–20)
CALCIUM: 8.7 mg/dL — AB (ref 8.9–10.3)
CO2: 27 mmol/L (ref 22–32)
CREATININE: 1.1 mg/dL (ref 0.61–1.24)
Chloride: 108 mmol/L (ref 101–111)
GFR, EST NON AFRICAN AMERICAN: 57 mL/min — AB (ref >60)
Glucose, Bld: 98 mg/dL (ref 65–99)
Potassium: 3.4 mmol/L — ABNORMAL LOW (ref 3.5–5.1)
SODIUM: 145 mmol/L (ref 135–145)

## 2015-08-16 LAB — CBC
HEMATOCRIT: 35.7 % — AB (ref 39.0–52.0)
Hemoglobin: 11.1 g/dL — ABNORMAL LOW (ref 13.0–17.0)
MCH: 32.5 pg (ref 26.0–34.0)
MCHC: 31.1 g/dL (ref 30.0–36.0)
MCV: 104.4 fL — ABNORMAL HIGH (ref 78.0–100.0)
PLATELETS: 100 10*3/uL — AB (ref 150–400)
RBC: 3.42 MIL/uL — ABNORMAL LOW (ref 4.22–5.81)
RDW: 13.6 % (ref 11.5–15.5)
WBC: 4.5 10*3/uL (ref 4.0–10.5)

## 2015-08-16 NOTE — Progress Notes (Signed)
[facility] PROGRESS NOTE  [name] [id number] DOB: 1923/02/27 DOA: 08/14/2015 PCP: Odette Fraction, MD  Assessment/Plan: 1. SBO -due to adhesions, improving based on KuB -IVF, NPO, supportive care -per CCS  2. Permanent Afib -rate controlled, continue IV BB and Digoxin -not on anticoagulation, s/p PPM for tachy-brady syndrome  3. Cognitive dysfunction/Senile Dementia -with confusion -now in wrist restraints, sitter at bedside if possible  4. Anemia/thrombocytopenia -dilution to some extent, hold lovenox  DVT proph: SCDs  Code Status: DNR Family Communication: none at bedside Disposition Plan: home when improved   Consultants:  CCS  HPI/Subjective: Was agitated overnight, pulled out NG and foley  Objective: Filed Vitals:   08/16/15 0632 08/16/15 1326  BP: 156/60 139/102  Pulse: 77 97  Temp: 98.7 F (37.1 C) 98.7 F (37.1 C)  Resp: 14 14    Intake/Output Summary (Last 24 hours) at 08/16/15 1411 Last data filed at 08/16/15 0959  Gross per 24 hour  Intake   1300 ml  Output   1250 ml  Net     50 ml   Filed Weights   08/15/15 0100  Weight: 62.143 kg (137 lb)    Exam:   General:  Alert, awake, confused  Cardiovascular: S1S2/RRR  Respiratory: soft, NT, BS present  Abdomen: soft, less distended, BS present  Musculoskeletal: no edema c/c  Data Reviewed: Basic Metabolic Panel:  Recent Labs Lab 08/14/15 1850 08/15/15 0540 08/16/15 0621  NA 140 141 145  K 3.9 3.7 3.4*  CL 97* 100* 108  CO2 32 28 27  GLUCOSE 176* 141* 98  BUN 34* 35* 35*  CREATININE 1.41* 1.27* 1.10  CALCIUM 9.2 8.8* 8.7*   Liver Function Tests:  Recent Labs Lab 08/14/15 1850 08/15/15 0540  AST 30 22  ALT 16* 12*  ALKPHOS 48 46  BILITOT 0.8 0.8  PROT 7.3 6.5  ALBUMIN 4.1 3.9    Recent Labs Lab 08/14/15 1850  LIPASE 21   No results for input(s): AMMONIA in the last 168 hours. CBC:  Recent Labs Lab 08/14/15 1850 08/15/15 0540  08/16/15 0621  WBC 6.3 4.2 4.5  NEUTROABS 5.6 3.4  --   HGB 13.1 11.8* 11.1*  HCT 41.4 37.1* 35.7*  MCV 101.7* 103.9* 104.4*  PLT 150 132* 100*   Cardiac Enzymes: No results for input(s): CKTOTAL, CKMB, CKMBINDEX, TROPONINI in the last 168 hours. BNP (last 3 results) No results for input(s): BNP in the last 8760 hours.  ProBNP (last 3 results) No results for input(s): PROBNP in the last 8760 hours.  CBG: No results for input(s): GLUCAP in the last 168 hours.  No results found for this or any previous visit (from the past 240 hour(s)).   Studies: Ct Abdomen Pelvis W Contrast  08/14/2015  CLINICAL DATA:  82 year old male with generalized abdominal pain for 3 days. Nausea and vomiting. EXAM: CT ABDOMEN AND PELVIS WITH CONTRAST TECHNIQUE: Multidetector CT imaging of the abdomen and pelvis was performed using the standard protocol following bolus administration of intravenous contrast. CONTRAST:  48mL OMNIPAQUE IOHEXOL 300 MG/ML  SOLN COMPARISON:  CT 08/28/2012 FINDINGS: Lower chest: The included lung bases are clear. Pacemaker wires partially included. Heart upper limits normal in size. Liver: Unchanged tiny hypodensity in the right lobe, incompletely characterize but likely cyst. Suspicious lesion. Hepatobiliary: Gallbladder physiologically distended. Calcified gallstones are seen. No biliary dilatation. Pancreas: Atrophic without ductal dilatation or surrounding inflammation. Spleen: Normal. Adrenal glands: No nodule. Kidneys: Symmetric renal enhancement. No hydronephrosis. There is a 1.9  cm simple cyst in the posterior interpolar right kidney. Trace of perinephric fluid is unchanged. Stomach/Bowel: Stomach markedly distended with fluid. Fluid in the distal esophagus. Small bowel loops are diffusely dilated and fluid-filled. Transition point in the right mid abdomen, best appreciated on coronal image 27. Small bowel loops distal to this are decompressed. There is adjacent mesenteric edema and  small amount of free fluid. No pneumatosis. No free air. Small bowel loops in the right lower quadrant extend towards the right inguinal canal, unchanged in appearance from prior exam. Moderate stool burden throughout the colon. Appendix tentatively identified and normal. Vascular/Lymphatic: No retroperitoneal adenopathy. Abdominal aorta is normal in caliber. Moderate atherosclerosis without aneurysm. Reproductive: Prostate gland prominent size. Bladder: Distended without wall thickening. Other: Small amount of free fluid in the pelvis is likely reactive. There is no free air. Probable previous right inguinal hernia repair. Musculoskeletal: Multilevel degenerative change throughout the lumbar spine There are no acute or suspicious osseous abnormalities. IMPRESSION: Small-bowel obstruction with transition point in the right mid abdomen. This is similar in location to prior exam, likely secondary to adhesions. Mesenteric edema and small amount of free fluid, no pneumatosis or perforation. Electronically Signed   By: Jeb Levering M.D.   On: 08/14/2015 20:40   Dg Abd 2 Views  08/16/2015  CLINICAL DATA:  Abdominal pain.  Follow-up small bowel obstruction. EXAM: ABDOMEN - 2 VIEW COMPARISON:  CT, 08/14/2015. FINDINGS: Dilated small bowel is seen in the left upper quadrant. Maximum diameter is measured at 4.7 cm, which is magnified by the AP supine technique. There is stool seen within colon.  There is no colonic dilation. No free air is noted on the decubitus view. There are few scattered air-fluid levels. Nasogastric tube projects in the mid stomach. IMPRESSION: 1. Persistent dilated small bowel left upper quadrant. However, overall, the small bowel obstruction appears improved. There is no free air. Electronically Signed   By: Lajean Manes M.D.   On: 08/16/2015 10:39    Scheduled Meds: . chlorhexidine gluconate  15 mL Mouth/Throat QID  . digoxin  0.125 mg Intravenous Daily  . famotidine (PEPCID) IV  20 mg  Intravenous Q12H  . metoprolol  5 mg Intravenous 4 times per day  . pantoprazole (PROTONIX) IV  40 mg Intravenous Q12H  . tamsulosin  0.4 mg Oral QHS   Continuous Infusions: . sodium chloride 75 mL/hr at 08/15/15 2237   Antibiotics Given (last 72 hours)    None      Active Problems:   SBO (small bowel obstruction) ([place])   Small bowel obstruction due to adhesions [facility])    Time spent: 67min    Jahvon Gosline  [facility] Pager [phone]. If 7PM-7AM, please contact night-coverage at [email or web address], password [facility] 08/16/2015, 2:11 PM  LOS: 2 days

## 2015-08-16 NOTE — Progress Notes (Signed)
Subjective: Complains of pain all over.  Objective: Vital signs in last 24 hours: Temp:  [98.2 F (36.8 C)-98.7 F (37.1 C)] 98.7 F (37.1 C) (01/14 [id number]) Pulse Rate:  [77-109] 77 (01/14 0632) Resp:  [14-16] 14 (01/14 0632) BP: (153-164)/(56-73) 156/60 mmHg (01/14 0632) SpO2:  [94 %-100 %] 100 % (01/14 [id number])    Intake/Output from previous day: 01/13 0701 - 01/14 0700 In: 3031.5 [I.V.:2831.5; NG/GT:100; IV Piggyback:100] Out: 2175 [Urine:1125; Emesis/NG output:1050] Intake/Output this shift: Total I/O In: 0  Out: 600 [Urine:300; Emesis/NG output:300]  Resp: clear to auscultation bilaterally Cardio: regular rate and rhythm GI: soft, nondistended. mild tenderness diffusely. good bs  Lab Results:   Recent Labs  08/15/15 0540 08/16/15 0621  WBC 4.2 4.5  HGB 11.8* 11.1*  HCT 37.1* 35.7*  PLT 132* 100*   BMET  Recent Labs  08/15/15 0540 08/16/15 0621  NA 141 145  K 3.7 3.4*  CL 100* 108  CO2 28 27  GLUCOSE 141* 98  BUN 35* 35*  CREATININE 1.27* 1.10  CALCIUM 8.8* 8.7*   PT/INR No results for input(s): LABPROT, INR in the last 72 hours. ABG No results for input(s): PHART, HCO3 in the last 72 hours.  Invalid input(s): PCO2, PO2  Studies/Results: Ct Abdomen Pelvis W Contrast  08/14/2015  CLINICAL DATA:  82 year old male with generalized abdominal pain for 3 days. Nausea and vomiting. EXAM: CT ABDOMEN AND PELVIS WITH CONTRAST TECHNIQUE: Multidetector CT imaging of the abdomen and pelvis was performed using the standard protocol following bolus administration of intravenous contrast. CONTRAST:  81mL OMNIPAQUE IOHEXOL 300 MG/ML  SOLN COMPARISON:  CT 08/28/2012 FINDINGS: Lower chest: The included lung bases are clear. Pacemaker wires partially included. Heart upper limits normal in size. Liver: Unchanged tiny hypodensity in the right lobe, incompletely characterize but likely cyst. Suspicious lesion. Hepatobiliary: Gallbladder physiologically distended. Calcified  gallstones are seen. No biliary dilatation. Pancreas: Atrophic without ductal dilatation or surrounding inflammation. Spleen: Normal. Adrenal glands: No nodule. Kidneys: Symmetric renal enhancement. No hydronephrosis. There is a 1.9 cm simple cyst in the posterior interpolar right kidney. Trace of perinephric fluid is unchanged. Stomach/Bowel: Stomach markedly distended with fluid. Fluid in the distal esophagus. Small bowel loops are diffusely dilated and fluid-filled. Transition point in the right mid abdomen, best appreciated on coronal image 27. Small bowel loops distal to this are decompressed. There is adjacent mesenteric edema and small amount of free fluid. No pneumatosis. No free air. Small bowel loops in the right lower quadrant extend towards the right inguinal canal, unchanged in appearance from prior exam. Moderate stool burden throughout the colon. Appendix tentatively identified and normal. Vascular/Lymphatic: No retroperitoneal adenopathy. Abdominal aorta is normal in caliber. Moderate atherosclerosis without aneurysm. Reproductive: Prostate gland prominent size. Bladder: Distended without wall thickening. Other: Small amount of free fluid in the pelvis is likely reactive. There is no free air. Probable previous right inguinal hernia repair. Musculoskeletal: Multilevel degenerative change throughout the lumbar spine There are no acute or suspicious osseous abnormalities. IMPRESSION: Small-bowel obstruction with transition point in the right mid abdomen. This is similar in location to prior exam, likely secondary to adhesions. Mesenteric edema and small amount of free fluid, no pneumatosis or perforation. Electronically Signed   By: Jeb Levering M.D.   On: 08/14/2015 20:40   Dg Abd 2 Views  08/16/2015  CLINICAL DATA:  Abdominal pain.  Follow-up small bowel obstruction. EXAM: ABDOMEN - 2 VIEW COMPARISON:  CT, 08/14/2015. FINDINGS: Dilated small bowel is seen in the  left upper quadrant. Maximum  diameter is measured at 4.7 cm, which is magnified by the AP supine technique. There is stool seen within colon.  There is no colonic dilation. No free air is noted on the decubitus view. There are few scattered air-fluid levels. Nasogastric tube projects in the mid stomach. IMPRESSION: 1. Persistent dilated small bowel left upper quadrant. However, overall, the small bowel obstruction appears improved. There is no free air. Electronically Signed   By: Lajean Manes M.D.   On: 08/16/2015 10:39    Anti-infectives: Anti-infectives    None      Assessment/Plan: s/p * No surgery found * abd xrays improved  Continue ng and bowel rest for now Will follow with you  LOS: 2 days    [name],[name] S 08/16/2015

## 2015-08-17 ENCOUNTER — Inpatient Hospital Stay (HOSPITAL_COMMUNITY): Payer: Medicare Other

## 2015-08-17 DIAGNOSIS — K5669 Other intestinal obstruction: Secondary | ICD-10-CM

## 2015-08-17 DIAGNOSIS — E876 Hypokalemia: Secondary | ICD-10-CM

## 2015-08-17 LAB — CBC
HEMATOCRIT: 39 % (ref 39.0–52.0)
Hemoglobin: 12.2 g/dL — ABNORMAL LOW (ref 13.0–17.0)
MCH: 32.4 pg (ref 26.0–34.0)
MCHC: 31.3 g/dL (ref 30.0–36.0)
MCV: 103.4 fL — AB (ref 78.0–100.0)
Platelets: 109 10*3/uL — ABNORMAL LOW (ref 150–400)
RBC: 3.77 MIL/uL — ABNORMAL LOW (ref 4.22–5.81)
RDW: 13.3 % (ref 11.5–15.5)
WBC: 6.2 10*3/uL (ref 4.0–10.5)

## 2015-08-17 LAB — BASIC METABOLIC PANEL
ANION GAP: 11 (ref 5–15)
BUN: 34 mg/dL — ABNORMAL HIGH (ref 6–20)
CALCIUM: 8.6 mg/dL — AB (ref 8.9–10.3)
CO2: 27 mmol/L (ref 22–32)
Chloride: 107 mmol/L (ref 101–111)
Creatinine, Ser: 1.02 mg/dL (ref 0.61–1.24)
GFR calc Af Amer: >60 mL/min (ref >60)
GFR calc non Af Amer: >60 mL/min (ref >60)
GLUCOSE: 83 mg/dL (ref 65–99)
Potassium: 3.4 mmol/L — ABNORMAL LOW (ref 3.5–5.1)
Sodium: 145 mmol/L (ref 135–145)

## 2015-08-17 MED ORDER — KCL IN DEXTROSE-NACL 20-5-0.9 MEQ/L-%-% IV SOLN
INTRAVENOUS | Status: DC
  Administered 2015-08-17 (×2): via INTRAVENOUS
  Filled 2015-08-17 (×3): qty 1000

## 2015-08-17 NOTE — Progress Notes (Signed)
Patient ID: [name], male   DOB: [birth year], 82 y.o.   MRN: [id number] [facility] Surgery Progress Note:   * No surgery found *  Subjective: Mental status is confused (baseline).  No apparent pain.  Has sitter with him Objective: Vital signs in last 24 hours: Temp:  [98.2 F (36.8 C)-98.9 F (37.2 C)] 98.2 F (36.8 C) (01/15 0538) Pulse Rate:  [65-97] 80 (01/15 0538) Resp:  [14-16] 16 (01/15 0538) BP: (139-157)/(65-102) 156/68 mmHg (01/15 0538) SpO2:  [76 %-100 %] 96 % (01/15 0538)  Intake/Output from previous day: 01/14 0701 - 01/15 0700 In: 1950 [P.O.:150; I.V.:1800] Out: 1220 [Urine:670; Emesis/NG output:550] Intake/Output this shift:    Physical Exam: Work of breathing is not labored.  NG in place.  No pain  Lab Results:  Results for orders placed or performed during the hospital encounter of 08/14/15 (from the past 48 hour(s))  CBC     Status: Abnormal   Collection Time: 08/16/15  6:21 AM  Result Value Ref Range   WBC 4.5 4.0 - 10.5 K/uL   RBC 3.42 (L) 4.22 - 5.81 MIL/uL   Hemoglobin 11.1 (L) 13.0 - 17.0 g/dL   HCT 35.7 (L) 39.0 - 52.0 %   MCV 104.4 (H) 78.0 - 100.0 fL   MCH 32.5 26.0 - 34.0 pg   MCHC 31.1 30.0 - 36.0 g/dL   RDW 13.6 11.5 - 15.5 %   Platelets 100 (L) 150 - 400 K/uL    Comment: PLATELET COUNT CONFIRMED BY SMEAR SPECIMEN CHECKED FOR CLOTS CONSISTENT WITH PREVIOUS RESULT   Basic metabolic panel     Status: Abnormal   Collection Time: 08/16/15  6:21 AM  Result Value Ref Range   Sodium 145 135 - 145 mmol/L   Potassium 3.4 (L) 3.5 - 5.1 mmol/L   Chloride 108 101 - 111 mmol/L   CO2 27 22 - 32 mmol/L   Glucose, Bld 98 65 - 99 mg/dL   BUN 35 (H) 6 - 20 mg/dL   Creatinine, Ser 1.10 0.61 - 1.24 mg/dL   Calcium 8.7 (L) 8.9 - 10.3 mg/dL   GFR calc non Af Amer 57 (L) >60 mL/min   GFR calc Af Amer >60 >60 mL/min    Comment: (NOTE) The eGFR has been calculated using the CKD EPI equation. This calculation has not been validated in all  clinical situations. eGFR's persistently <60 mL/min signify possible Chronic Kidney Disease.    Anion gap 10 5 - 15  CBC     Status: Abnormal   Collection Time: 08/17/15  8:36 AM  Result Value Ref Range   WBC 6.2 4.0 - 10.5 K/uL   RBC 3.77 (L) 4.22 - 5.81 MIL/uL   Hemoglobin 12.2 (L) 13.0 - 17.0 g/dL   HCT 39.0 39.0 - 52.0 %   MCV 103.4 (H) 78.0 - 100.0 fL   MCH 32.4 26.0 - 34.0 pg   MCHC 31.3 30.0 - 36.0 g/dL   RDW 13.3 11.5 - 15.5 %   Platelets 109 (L) 150 - 400 K/uL    Comment: SPECIMEN CHECKED FOR CLOTS CONSISTENT WITH PREVIOUS RESULT   Basic metabolic panel     Status: Abnormal   Collection Time: 08/17/15  8:36 AM  Result Value Ref Range   Sodium 145 135 - 145 mmol/L   Potassium 3.4 (L) 3.5 - 5.1 mmol/L   Chloride 107 101 - 111 mmol/L   CO2 27 22 - 32 mmol/L   Glucose, Bld 83 65 -  99 mg/dL   BUN 34 (H) 6 - 20 mg/dL   Creatinine, Ser 1.02 0.61 - 1.24 mg/dL   Calcium 8.6 (L) 8.9 - 10.3 mg/dL   GFR calc non Af Amer >60 >60 mL/min   GFR calc Af Amer >60 >60 mL/min    Comment: (NOTE) The eGFR has been calculated using the CKD EPI equation. This calculation has not been validated in all clinical situations. eGFR's persistently <60 mL/min signify possible Chronic Kidney Disease.    Anion gap 11 5 - 15    Radiology/Results: Dg Abd 2 Views  08/16/2015  CLINICAL DATA:  Abdominal pain.  Follow-up small bowel obstruction. EXAM: ABDOMEN - 2 VIEW COMPARISON:  CT, 08/14/2015. FINDINGS: Dilated small bowel is seen in the left upper quadrant. Maximum diameter is measured at 4.7 cm, which is magnified by the AP supine technique. There is stool seen within colon.  There is no colonic dilation. No free air is noted on the decubitus view. There are few scattered air-fluid levels. Nasogastric tube projects in the mid stomach. IMPRESSION: 1. Persistent dilated small bowel left upper quadrant. However, overall, the small bowel obstruction appears improved. There is no free air.  Electronically Signed   By: Lajean Manes M.D.   On: 08/16/2015 10:39    Anti-infectives: Anti-infectives    None      Assessment/Plan: Problem List: Patient Active Problem List   Diagnosis Date Noted  . Small bowel obstruction due to adhesions ([place]) 08/15/2015  . SBO (small bowel obstruction) ([place]) 08/14/2015  . BPH (benign prostatic hyperplasia)   . Partial small bowel obstruction ([place]) 08/28/2012  . AKI (acute kidney injury) ([place]) 08/28/2012  . Abdominal pain, acute 08/28/2012  . Nausea and vomiting 08/28/2012  . Dehydration 08/28/2012  . Pacemaker-[employer] 12/22/2010  . HYPERLIPIDEMIA 08/28/2010  . Atrial fibrillation ([place]) 08/28/2010  . BRADYCARDIA-TACHYCARDIA SYNDROME 08/28/2010    No flatus noted by sitter.  Continue observation.   * No surgery found *    LOS: 3 days   Matt B. Hassell Done, MD, [facility] [phone] beeper [phone]  08/17/2015 10:17 AM

## 2015-08-17 NOTE — Progress Notes (Signed)
[facility] PROGRESS NOTE  [name] [id number] DOB: [birth year] DOA: 08/14/2015 PCP: Odette Fraction, MD  Assessment/Plan: 1. SBO -due to adhesions, improving based on KuB -IVF, NPO, supportive care -per CCS  2. Permanent Afib -rate controlled, continue IV BB and Digoxin -not on anticoagulation, s/p PPM for tachy-brady syndrome  3. Cognitive dysfunction/Senile Dementia -with confusion -now in wrist restraints, sitter at bedside if possible  4. Anemia/thrombocytopenia -dilution to some extent, hold lovenox  5. Hypokalemia: change ivf to d5/ns/20k, check mag  DVT proph: SCDs  Code Status: DNR Family Communication: none at bedside Disposition Plan: home when improved   Consultants:  CCS  HPI/Subjective: Remained confused ( at baseline), in soft restrain, sitting in room (patient has pulled out Ng and foley initially during admission) ,  on ng suction,   Objective: Filed Vitals:   08/16/15 2328 08/17/15 0538  BP: 157/75 156/68  Pulse: 76 80  Temp:  98.2 F (36.8 C)  Resp:  16    Intake/Output Summary (Last 24 hours) at 08/17/15 1127 Last data filed at 08/17/15 0600  Gross per 24 hour  Intake   1950 ml  Output    620 ml  Net   1330 ml   Filed Weights   08/15/15 0100  Weight: 62.143 kg (137 lb)    Exam:   General:  Alert, awake, confused  Cardiovascular: S1S2/RRR  Respiratory: soft, NT, BS present  Abdomen: soft, less distended, BS present  Musculoskeletal: no edema c/c  Data Reviewed: Basic Metabolic Panel:  Recent Labs Lab 08/14/15 1850 08/15/15 0540 08/16/15 0621 08/17/15 0836  NA 140 141 145 145  K 3.9 3.7 3.4* 3.4*  CL 97* 100* 108 107  CO2 32 28 27 27   GLUCOSE 176* 141* 98 83  BUN 34* 35* 35* 34*  CREATININE 1.41* 1.27* 1.10 1.02  CALCIUM 9.2 8.8* 8.7* 8.6*   Liver Function Tests:  Recent Labs Lab 08/14/15 1850 08/15/15 0540  AST 30 22  ALT 16* 12*  ALKPHOS 48 46  BILITOT 0.8 0.8  PROT 7.3 6.5   ALBUMIN 4.1 3.9    Recent Labs Lab 08/14/15 1850  LIPASE 21   No results for input(s): AMMONIA in the last 168 hours. CBC:  Recent Labs Lab 08/14/15 1850 08/15/15 0540 08/16/15 0621 08/17/15 0836  WBC 6.3 4.2 4.5 6.2  NEUTROABS 5.6 3.4  --   --   HGB 13.1 11.8* 11.1* 12.2*  HCT 41.4 37.1* 35.7* 39.0  MCV 101.7* 103.9* 104.4* 103.4*  PLT 150 132* 100* 109*   Cardiac Enzymes: No results for input(s): CKTOTAL, CKMB, CKMBINDEX, TROPONINI in the last 168 hours. BNP (last 3 results) No results for input(s): BNP in the last 8760 hours.  ProBNP (last 3 results) No results for input(s): PROBNP in the last 8760 hours.  CBG: No results for input(s): GLUCAP in the last 168 hours.  No results found for this or any previous visit (from the past 240 hour(s)).   Studies: Dg Abd 1 View  08/17/2015  CLINICAL DATA:  Abdominal pain and distention EXAM: ABDOMEN - 1 VIEW COMPARISON:  08/16/2015 FINDINGS: The LEFT lateral quadrant of the abdomen excluded. NG tube in stomach. No dilated loops of large or small bowel. Gas and the rectum. Moderate volume stool throughout the colon on the RIGHT colon. IMPRESSION: 1. No evidence of bowel obstruction. 2. Moderate to large volume stool. Electronically Signed   By: Suzy Bouchard M.D.   On: 08/17/2015 10:33   Dg Abd 2  Views  08/16/2015  CLINICAL DATA:  Abdominal pain.  Follow-up small bowel obstruction. EXAM: ABDOMEN - 2 VIEW COMPARISON:  CT, 08/14/2015. FINDINGS: Dilated small bowel is seen in the left upper quadrant. Maximum diameter is measured at 4.7 cm, which is magnified by the AP supine technique. There is stool seen within colon.  There is no colonic dilation. No free air is noted on the decubitus view. There are few scattered air-fluid levels. Nasogastric tube projects in the mid stomach. IMPRESSION: 1. Persistent dilated small bowel left upper quadrant. However, overall, the small bowel obstruction appears improved. There is no free air.  Electronically Signed   By: Lajean Manes M.D.   On: 08/16/2015 10:39    Scheduled Meds: . chlorhexidine gluconate  15 mL Mouth/Throat QID  . digoxin  0.125 mg Intravenous Daily  . famotidine (PEPCID) IV  20 mg Intravenous Q12H  . metoprolol  5 mg Intravenous 4 times per day  . pantoprazole (PROTONIX) IV  40 mg Intravenous Q12H  . tamsulosin  0.4 mg Oral QHS   Continuous Infusions: . dextrose 5 % and 0.9 % NaCl with KCl 20 mEq/L     Antibiotics Given (last 72 hours)    None      Active Problems:   SBO (small bowel obstruction) ([facility])   Small bowel obstruction due to adhesions [facility])    Time spent: 51min    Itzell Bendavid MD PhD  [facility] Pager [phone]. If 7PM-7AM, please contact night-coverage at [email or web address], password [facility] 08/17/2015, 11:27 AM  LOS: 3 days

## 2015-08-18 DIAGNOSIS — R627 Adult failure to thrive: Secondary | ICD-10-CM

## 2015-08-18 LAB — BASIC METABOLIC PANEL
ANION GAP: 9 (ref 5–15)
BUN: 34 mg/dL — ABNORMAL HIGH (ref 6–20)
CHLORIDE: 113 mmol/L — AB (ref 101–111)
CO2: 27 mmol/L (ref 22–32)
Calcium: 8.7 mg/dL — ABNORMAL LOW (ref 8.9–10.3)
Creatinine, Ser: 1.1 mg/dL (ref 0.61–1.24)
GFR, EST NON AFRICAN AMERICAN: 57 mL/min — AB (ref >60)
Glucose, Bld: 101 mg/dL — ABNORMAL HIGH (ref 65–99)
POTASSIUM: 3.8 mmol/L (ref 3.5–5.1)
SODIUM: 149 mmol/L — AB (ref 135–145)

## 2015-08-18 LAB — URINE MICROSCOPIC-ADD ON

## 2015-08-18 LAB — URINALYSIS, ROUTINE W REFLEX MICROSCOPIC
Bilirubin Urine: NEGATIVE
Glucose, UA: NEGATIVE mg/dL
KETONES UR: 15 mg/dL — AB
LEUKOCYTES UA: NEGATIVE
Nitrite: NEGATIVE
PROTEIN: NEGATIVE mg/dL
Specific Gravity, Urine: 1.015 (ref 1.005–1.030)
pH: 5.5 (ref 5.0–8.0)

## 2015-08-18 LAB — MAGNESIUM: MAGNESIUM: 2.2 mg/dL (ref 1.7–2.4)

## 2015-08-18 MED ORDER — BISACODYL 10 MG RE SUPP
10.0000 mg | Freq: Once | RECTAL | Status: AC
  Administered 2015-08-18: 10 mg via RECTAL
  Filled 2015-08-18: qty 1

## 2015-08-18 MED ORDER — POTASSIUM CL IN DEXTROSE 5% 20 MEQ/L IV SOLN
20.0000 meq | INTRAVENOUS | Status: DC
  Administered 2015-08-18 – 2015-08-20 (×3): 20 meq via INTRAVENOUS
  Filled 2015-08-18 (×7): qty 1000

## 2015-08-18 NOTE — Progress Notes (Signed)
Pt constantly kicking his legs against and over the side rails bed trying to get out of bed.  He has bruising and abrasions as a result.

## 2015-08-18 NOTE — Plan of Care (Signed)
Problem: Education: Goal: Knowledge of [place] [facility] Education information/materials will improve Outcome: Not Met (add Reason) Pt confused  Problem: Activity: Goal: Risk for activity intolerance will decrease Outcome: Not Progressing Pt in restraints in bed  Problem: Nutrition: Goal: Adequate nutrition will be maintained Outcome: Not Applicable Date Met:  91/69/45 Pt NPO

## 2015-08-18 NOTE — Progress Notes (Signed)
[facility] PROGRESS NOTE  [name] [id number] DOB: [birth year] DOA: 08/14/2015 PCP: Odette Fraction, MD  Assessment/Plan: 1. SBO -due to adhesions, improving based on KuB -IVF, NPO, supportive care -per CCS  2. Permanent Afib -rate controlled, continue IV BB and Digoxin -not on anticoagulation, s/p PPM for tachy-brady syndrome  3. Cognitive dysfunction/Senile Dementia -with confusion -now in wrist restraints, sitter at bedside if possible  4. Anemia/thrombocytopenia -dilution to some extent, hold lovenox  5. Hypokalemia: change ivf to d5/ns/20k,  Mag wnl.  6. Hyperchloremic hypernatremia: will change ivf to d5/20k, repeat labs in am.  7. Macrocytosis: mcv 103-104, will check b12/folate/tsh   DVT proph: SCDs  Code Status: DNR Family Communication: none at bedside Disposition Plan: pending, may need snf   Consultants:  CCS  HPI/Subjective: Remained confused ( at baseline), in soft restrain, sitter in room (patient has pulled out Ng and foley initially during admission) , sitter reported did not notice flatus, no bm. on ng suction,   Objective: Filed Vitals:   08/18/15 0631 08/18/15 1114  BP: 160/89 163/68  Pulse: 94 76  Temp:    Resp:      Intake/Output Summary (Last 24 hours) at 08/18/15 1233 Last data filed at 08/18/15 1100  Gross per 24 hour  Intake   1315 ml  Output   1575 ml  Net   -260 ml   Filed Weights   08/15/15 0100  Weight: 62.143 kg (137 lb)    Exam:   General:  lethargic, confused  Cardiovascular: S1S2/RRR  Respiratory: soft, NT, BS present  Abdomen: soft, less distended, BS present, but decreased  Musculoskeletal: no edema c/c  Data Reviewed: Basic Metabolic Panel:  Recent Labs Lab 08/14/15 1850 08/15/15 0540 08/16/15 0621 08/17/15 0836 08/18/15 0415  NA 140 141 145 145 149*  K 3.9 3.7 3.4* 3.4* 3.8  CL 97* 100* 108 107 113*  CO2 32 28 27 27 27   GLUCOSE 176* 141* 98 83 101*  BUN 34* 35* 35*  34* 34*  CREATININE 1.41* 1.27* 1.10 1.02 1.10  CALCIUM 9.2 8.8* 8.7* 8.6* 8.7*  MG  --   --   --   --  2.2   Liver Function Tests:  Recent Labs Lab 08/14/15 1850 08/15/15 0540  AST 30 22  ALT 16* 12*  ALKPHOS 48 46  BILITOT 0.8 0.8  PROT 7.3 6.5  ALBUMIN 4.1 3.9    Recent Labs Lab 08/14/15 1850  LIPASE 21   No results for input(s): AMMONIA in the last 168 hours. CBC:  Recent Labs Lab 08/14/15 1850 08/15/15 0540 08/16/15 0621 08/17/15 0836  WBC 6.3 4.2 4.5 6.2  NEUTROABS 5.6 3.4  --   --   HGB 13.1 11.8* 11.1* 12.2*  HCT 41.4 37.1* 35.7* 39.0  MCV 101.7* 103.9* 104.4* 103.4*  PLT 150 132* 100* 109*   Cardiac Enzymes: No results for input(s): CKTOTAL, CKMB, CKMBINDEX, TROPONINI in the last 168 hours. BNP (last 3 results) No results for input(s): BNP in the last 8760 hours.  ProBNP (last 3 results) No results for input(s): PROBNP in the last 8760 hours.  CBG: No results for input(s): GLUCAP in the last 168 hours.  No results found for this or any previous visit (from the past 240 hour(s)).   Studies: Dg Abd 1 View  08/17/2015  CLINICAL DATA:  Abdominal pain and distention EXAM: ABDOMEN - 1 VIEW COMPARISON:  08/16/2015 FINDINGS: The LEFT lateral quadrant of the abdomen excluded. NG tube in stomach. No  dilated loops of large or small bowel. Gas and the rectum. Moderate volume stool throughout the colon on the RIGHT colon. IMPRESSION: 1. No evidence of bowel obstruction. 2. Moderate to large volume stool. Electronically Signed   By: Suzy Bouchard M.D.   On: 08/17/2015 10:33    Scheduled Meds: . chlorhexidine gluconate  15 mL Mouth/Throat QID  . digoxin  0.125 mg Intravenous Daily  . famotidine (PEPCID) IV  20 mg Intravenous Q12H  . metoprolol  5 mg Intravenous 4 times per day  . pantoprazole (PROTONIX) IV  40 mg Intravenous Q12H  . tamsulosin  0.4 mg Oral QHS   Continuous Infusions: . dextrose 5 % with KCl 20 mEq / L     Antibiotics Given (last 72  hours)    None      Active Problems:   SBO (small bowel obstruction) (HCC)   Small bowel obstruction due to adhesions [facility])    Time spent: 36min    Lidie Glade MD PhD  [facility] Pager [phone]. If 7PM-7AM, please contact night-coverage at [email or web address], password [facility] 08/18/2015, 12:33 PM  LOS: 4 days

## 2015-08-18 NOTE — Progress Notes (Signed)
Patient ID: [name], male   DOB: November 10, 1922, 82 y.o.   MRN: [id number]     [facility] [place]      [address] [place], [place] [address]    Phone: [phone] FAX: [phone]     Subjective: Asleep.  No BM.   Objective:  Vital signs:  Filed Vitals:   08/17/15 1230 08/17/15 1443 08/17/15 2041 08/18/15 0631  BP: 143/68 151/86 148/110 160/89  Pulse: 80 62 111 94  Temp:  98.9 F (37.2 C) 97.7 F (36.5 C)   TempSrc:  Axillary Axillary   Resp:  20    Height:      Weight:      SpO2:  93% 100% 99%       Intake/Output   Yesterday:  01/15 0701 - 01/16 0700 In: 1465 [I.V.:1015; NG/GT:200; IV Piggyback:250] Out: 1325 [Urine:1075; Emesis/NG output:250] This shift:  Total I/O In: -  Out: 250 [Urine:250]   Physical Exam: General: Pt asleep, easily arousable.  Abdomen: Soft.  Nondistended.  Non tender.  No evidence of peritonitis.  No incarcerated hernias.    Problem List:   Active Problems:   SBO (small bowel obstruction) (HCC)   Small bowel obstruction due to adhesions [facility])    Results:   Labs: Results for orders placed or performed during the hospital encounter of 08/14/15 (from the past 48 hour(s))  CBC     Status: Abnormal   Collection Time: 08/17/15  8:36 AM  Result Value Ref Range   WBC 6.2 4.0 - 10.5 K/uL   RBC 3.77 (L) 4.22 - 5.81 MIL/uL   Hemoglobin 12.2 (L) 13.0 - 17.0 g/dL   HCT 39.0 39.0 - 52.0 %   MCV 103.4 (H) 78.0 - 100.0 fL   MCH 32.4 26.0 - 34.0 pg   MCHC 31.3 30.0 - 36.0 g/dL   RDW 13.3 11.5 - 15.5 %   Platelets 109 (L) 150 - 400 K/uL    Comment: SPECIMEN CHECKED FOR CLOTS CONSISTENT WITH PREVIOUS RESULT   Basic metabolic panel     Status: Abnormal   Collection Time: 08/17/15  8:36 AM  Result Value Ref Range   Sodium 145 135 - 145 mmol/L   Potassium 3.4 (L) 3.5 - 5.1 mmol/L   Chloride 107 101 - 111 mmol/L   CO2 27 22 - 32 mmol/L   Glucose, Bld 83 65 - 99 mg/dL   BUN 34 (H) 6 -  20 mg/dL   Creatinine, Ser 1.02 0.61 - 1.24 mg/dL   Calcium 8.6 (L) 8.9 - 10.3 mg/dL   GFR calc non Af Amer >60 >60 mL/min   GFR calc Af Amer >60 >60 mL/min    Comment: (NOTE) The eGFR has been calculated using the CKD EPI equation. This calculation has not been validated in all clinical situations. eGFR's persistently <60 mL/min signify possible Chronic Kidney Disease.    Anion gap 11 5 - 15  Basic metabolic panel     Status: Abnormal   Collection Time: 08/18/15  4:15 AM  Result Value Ref Range   Sodium 149 (H) 135 - 145 mmol/L   Potassium 3.8 3.5 - 5.1 mmol/L   Chloride 113 (H) 101 - 111 mmol/L   CO2 27 22 - 32 mmol/L   Glucose, Bld 101 (H) 65 - 99 mg/dL   BUN 34 (H) 6 - 20 mg/dL   Creatinine, Ser 1.10 0.61 - 1.24 mg/dL   Calcium 8.7 (L) 8.9 - 10.3  mg/dL   GFR calc non Af Amer 57 (L) >60 mL/min   GFR calc Af Amer >60 >60 mL/min    Comment: (NOTE) The eGFR has been calculated using the CKD EPI equation. This calculation has not been validated in all clinical situations. eGFR's persistently <60 mL/min signify possible Chronic Kidney Disease.    Anion gap 9 5 - 15  Magnesium     Status: None   Collection Time: 08/18/15  4:15 AM  Result Value Ref Range   Magnesium 2.2 1.7 - 2.4 mg/dL    Imaging / Studies: Dg Abd 1 View  08/17/2015  CLINICAL DATA:  Abdominal pain and distention EXAM: ABDOMEN - 1 VIEW COMPARISON:  08/16/2015 FINDINGS: The LEFT lateral quadrant of the abdomen excluded. NG tube in stomach. No dilated loops of large or small bowel. Gas and the rectum. Moderate volume stool throughout the colon on the RIGHT colon. IMPRESSION: 1. No evidence of bowel obstruction. 2. Moderate to large volume stool. Electronically Signed   By: Suzy Bouchard M.D.   On: 08/17/2015 10:33    Medications / Allergies:  Scheduled Meds: . bisacodyl  10 mg Rectal Once  . chlorhexidine gluconate  15 mL Mouth/Throat QID  . digoxin  0.125 mg Intravenous Daily  . famotidine (PEPCID) IV  20  mg Intravenous Q12H  . metoprolol  5 mg Intravenous 4 times per day  . pantoprazole (PROTONIX) IV  40 mg Intravenous Q12H  . tamsulosin  0.4 mg Oral QHS   Continuous Infusions: . dextrose 5 % and 0.9 % NaCl with KCl 20 mEq/L 75 mL/hr at 08/17/15 2255   PRN Meds:.acetaminophen **OR** acetaminophen, HYDROmorphone (DILAUDID) injection, labetalol, loteprednol, ondansetron **OR** ondansetron (ZOFRAN) IV  Antibiotics: Anti-infectives    None        Assessment/Plan SBO-AXR shows resolution, no BM, little NGT output.  Will clamp NGT and only supervised sips since at risk for aspiration.  AXR in AM.  Advance slowly.  Give dulcolax suppository for moderate colonic stool burden.   Erby Pian, [facility] Surgery Pager [phone](7A-4:30P)   08/18/2015 11:13 AM

## 2015-08-18 NOTE — Progress Notes (Signed)
25cc residual from NG.  60cc air insterted thru both NG ports

## 2015-08-18 NOTE — Plan of Care (Signed)
Problem: Activity: Goal: Risk for activity intolerance will decrease Outcome: Not Progressing Pt in restraints in bed with sitter

## 2015-08-19 ENCOUNTER — Inpatient Hospital Stay (HOSPITAL_COMMUNITY): Payer: Medicare Other

## 2015-08-19 DIAGNOSIS — I481 Persistent atrial fibrillation: Secondary | ICD-10-CM

## 2015-08-19 DIAGNOSIS — F0390 Unspecified dementia without behavioral disturbance: Secondary | ICD-10-CM

## 2015-08-19 LAB — TSH: TSH: 3.379 u[IU]/mL (ref 0.350–4.500)

## 2015-08-19 LAB — BASIC METABOLIC PANEL
Anion gap: 9 (ref 5–15)
BUN: 25 mg/dL — ABNORMAL HIGH (ref 6–20)
CHLORIDE: 107 mmol/L (ref 101–111)
CO2: 29 mmol/L (ref 22–32)
CREATININE: 1.03 mg/dL (ref 0.61–1.24)
Calcium: 8.5 mg/dL — ABNORMAL LOW (ref 8.9–10.3)
GFR calc non Af Amer: >60 mL/min (ref >60)
Glucose, Bld: 117 mg/dL — ABNORMAL HIGH (ref 65–99)
Potassium: 3.4 mmol/L — ABNORMAL LOW (ref 3.5–5.1)
SODIUM: 145 mmol/L (ref 135–145)

## 2015-08-19 LAB — VITAMIN B12: VITAMIN B 12: 638 pg/mL (ref 180–914)

## 2015-08-19 LAB — CBC
HCT: 42.3 % (ref 39.0–52.0)
HEMOGLOBIN: 13.4 g/dL (ref 13.0–17.0)
MCH: 32.3 pg (ref 26.0–34.0)
MCHC: 31.7 g/dL (ref 30.0–36.0)
MCV: 101.9 fL — ABNORMAL HIGH (ref 78.0–100.0)
Platelets: 122 10*3/uL — ABNORMAL LOW (ref 150–400)
RBC: 4.15 MIL/uL — AB (ref 4.22–5.81)
RDW: 13 % (ref 11.5–15.5)
WBC: 7.5 10*3/uL (ref 4.0–10.5)

## 2015-08-19 MED ORDER — POTASSIUM CHLORIDE 10 MEQ/100ML IV SOLN
10.0000 meq | INTRAVENOUS | Status: AC
  Administered 2015-08-19 (×4): 10 meq via INTRAVENOUS
  Filled 2015-08-19 (×4): qty 100

## 2015-08-19 MED ORDER — MILK AND MOLASSES ENEMA
1.0000 | Freq: Two times a day (BID) | RECTAL | Status: DC
  Administered 2015-08-19 (×2): 250 mL via RECTAL
  Filled 2015-08-19 (×5): qty 250

## 2015-08-19 NOTE — Progress Notes (Signed)
General Surgery:  X-rays show a few loops of small bowel.  Improving.  Lots of stool and air in colon. I will give enemas to see if this helps.  [name]

## 2015-08-19 NOTE — Progress Notes (Signed)
Daughter came to see patient today. Daughter provided update on patient as several things have changed today (NGT d/c, clear liquids, & enema). I talked to the daughter about plan of care at discharge. Daughter states that she has sent a message to [place] and expressed that he will not be returning to the facility at discharge. Daughter is wanting the patient to go to a facility at discharge but can take the patient for a few days at home if needed although pt will need to be placed to a facility. The patients wife is a patient at [facility] on [address] and this place would be of interest if they have space for him given his mental status. Daughter is available to call with questions arise.

## 2015-08-19 NOTE — Progress Notes (Signed)
  Subjective: He is still confused, he was spitting at staff in PM, they gave him some ice chips yesterday, but he won't let them do oral care and it does not sound like he got any sips of clears yesterday.    Objective: Vital signs in last 24 hours: Temp:  [97.9 F (36.6 C)-98.8 F (37.1 C)] 97.9 F (36.6 C) (01/17 0556) Pulse Rate:  [75-84] 84 (01/17 0556) Resp:  [16] 16 (01/17 0556) BP: (139-164)/(68-82) 139/73 mmHg (01/17 0556) SpO2:  [93 %-100 %] 100 % (01/17 0556)   25 from the NG, Nothing PO recorded Afebrile, VSS K+ 3.4, CBC stable, platelets are low Film yesterday showed no obstruction   Intake/Output from previous day: 01/16 0701 - 01/17 0700 In: 1850 [I.V.:1800; IV Piggyback:50] Out: [id number] [Urine:750; Emesis/NG output:25] Intake/Output this shift:    General appearance: alert, cooperative and no distress GI: He has BS, reports of BM yesterday early, but none since.  + BS, Abdomen is tight, but he is not tender to palpation.  Lab Results:   Recent Labs  08/17/15 0836 08/19/15 0355  WBC 6.2 7.5  HGB 12.2* 13.4  HCT 39.0 42.3  PLT 109* 122*    BMET  Recent Labs  08/18/15 0415 08/19/15 0355  NA 149* 145  K 3.8 3.4*  CL 113* 107  CO2 27 29  GLUCOSE 101* 117*  BUN 34* 25*  CREATININE 1.10 1.03  CALCIUM 8.7* 8.5*   PT/INR No results for input(s): LABPROT, INR in the last 72 hours.   Recent Labs Lab 08/14/15 1850 08/15/15 0540  AST 30 22  ALT 16* 12*  ALKPHOS 48 46  BILITOT 0.8 0.8  PROT 7.3 6.5  ALBUMIN 4.1 3.9     Lipase     Component Value Date/Time   LIPASE 21 08/14/2015 1850     Studies/Results: No results found.  Medications: . chlorhexidine gluconate  15 mL Mouth/Throat QID  . digoxin  0.125 mg Intravenous Daily  . famotidine (PEPCID) IV  20 mg Intravenous Q12H  . metoprolol  5 mg Intravenous 4 times per day  . pantoprazole (PROTONIX) IV  40 mg Intravenous Q12H  . tamsulosin  0.4 mg Oral QHS   . dextrose 5 % with KCl  20 mEq / L 20 mEq (08/18/15 1320)  IV currently at 75 ml/hr  Assessment/Plan Recurrent SBO; prior laparotomy and hernia repair Chronic AF,  Senile dementia with cognitive dysfunction Tachy-brady syndrome with PTVP Anemia/Thrombocytopenia DNR Antibiotics:  None DVT:  i have added SCD   Plan:  Await film results, I would ask Medicine to replace K+, get him up to around 4.0.  If the film is OK this AM,  Pull the NG tube and start some clears.  Consider goals of care for this gentleman, he is very hard to evaluate, and he has no cognitive ability to assist in his care.    LOS: 5 days    Nyle Limb 08/19/2015

## 2015-08-19 NOTE — Progress Notes (Signed)
[facility] PROGRESS NOTE  [name] [id number] DOB: August 13, 1922 DOA: 08/14/2015 PCP: Odette Fraction, MD  Assessment/Plan: 1. SBO -due to adhesions, improving based on KuB -ng removed on 1/17 per CCS, started clears.  -over all poor prognosis, general surgery recommended palliative care consult.  2. Constipation: enema  3. Permanent Afib -rate controlled, continue IV BB and Digoxin -not on anticoagulation, s/p PPM for tachy-brady syndrome  4. Cognitive dysfunction/Senile Dementia -with confusion - in wrist restraints, sitter at bedside  5. Anemia/thrombocytopenia -dilution to some extent, hold lovenox  6. Hypokalemia: replace k, Mag wnl.  7. Hyperchloremic hypernatremia:  ivf d5/20k, repeat labs in am.  8. Macrocytosis: mcv 103-104, Tsh/b12 wnl , folate pending.   DVT proph: SCDs  Code Status: DNR Family Communication: none at bedside Disposition Plan: pending palliative care input   Consultants:  CCS  Palliative care  HPI/Subjective: Remained confused ( at baseline), in soft restrain, sitter in room (patient has pulled out NG and foley initially during admission)    Objective: Filed Vitals:   08/19/15 1059 08/19/15 1232  BP:  134/70  Pulse: 88 82  Temp:    Resp: 16 16    Intake/Output Summary (Last 24 hours) at 08/19/15 1349 Last data filed at 08/19/15 0956  Gross per 24 hour  Intake   1250 ml  Output    625 ml  Net    625 ml   Filed Weights   08/15/15 0100  Weight: 62.143 kg (137 lb)    Exam:   General:  lethargic, confused  Cardiovascular: S1S2/RRR  Respiratory: soft, NT, BS present  Abdomen: soft, less distended, BS present, but decreased  Musculoskeletal: no edema c/c  Data Reviewed: Basic Metabolic Panel:  Recent Labs Lab 08/15/15 0540 08/16/15 0621 08/17/15 0836 08/18/15 0415 08/19/15 0355  NA 141 145 145 149* 145  K 3.7 3.4* 3.4* 3.8 3.4*  CL 100* 108 107 113* 107  CO2 28 27 27 27 29   GLUCOSE  141* 98 83 101* 117*  BUN 35* 35* 34* 34* 25*  CREATININE 1.27* 1.10 1.02 1.10 1.03  CALCIUM 8.8* 8.7* 8.6* 8.7* 8.5*  MG  --   --   --  2.2  --    Liver Function Tests:  Recent Labs Lab 08/14/15 1850 08/15/15 0540  AST 30 22  ALT 16* 12*  ALKPHOS 48 46  BILITOT 0.8 0.8  PROT 7.3 6.5  ALBUMIN 4.1 3.9    Recent Labs Lab 08/14/15 1850  LIPASE 21   No results for input(s): AMMONIA in the last 168 hours. CBC:  Recent Labs Lab 08/14/15 1850 08/15/15 0540 08/16/15 0621 08/17/15 0836 08/19/15 0355  WBC 6.3 4.2 4.5 6.2 7.5  NEUTROABS 5.6 3.4  --   --   --   HGB 13.1 11.8* 11.1* 12.2* 13.4  HCT 41.4 37.1* 35.7* 39.0 42.3  MCV 101.7* 103.9* 104.4* 103.4* 101.9*  PLT 150 132* 100* 109* 122*   Cardiac Enzymes: No results for input(s): CKTOTAL, CKMB, CKMBINDEX, TROPONINI in the last 168 hours. BNP (last 3 results) No results for input(s): BNP in the last 8760 hours.  ProBNP (last 3 results) No results for input(s): PROBNP in the last 8760 hours.  CBG: No results for input(s): GLUCAP in the last 168 hours.  No results found for this or any previous visit (from the past 240 hour(s)).   Studies: Dg Abd 2 Views  08/19/2015  CLINICAL DATA:  Follow-up bowel obstruction EXAM: ABDOMEN - 2 VIEW COMPARISON:  August 17 2015 FINDINGS: Extensive bowel content is identified throughout colon. There are a few dilated small bowel loops in the mid abdomen. Scattered air is identified throughout colon. A nasogastric tube is identified distal tip in the stomach. Degenerative joint changes of the spine are identified. IMPRESSION: A few dilated small bowel loops in the mid abdomen with scattered air identified throughout colon. This could be due to resolving or partial small bowel obstruction. Extensive bowel content noted throughout colon. Electronically Signed   By: Abelardo Diesel M.D.   On: 08/19/2015 10:55    Scheduled Meds: . chlorhexidine gluconate  15 mL Mouth/Throat QID  . digoxin   0.125 mg Intravenous Daily  . famotidine (PEPCID) IV  20 mg Intravenous Q12H  . metoprolol  5 mg Intravenous 4 times per day  . milk and molasses  1 enema Rectal BID  . pantoprazole (PROTONIX) IV  40 mg Intravenous Q12H  . potassium chloride  10 mEq Intravenous Q1 Hr x 4  . tamsulosin  0.4 mg Oral QHS   Continuous Infusions: . dextrose 5 % with KCl 20 mEq / L 20 mEq (08/18/15 1320)   Antibiotics Given (last 72 hours)    None      Active Problems:   SBO (small bowel obstruction) (HCC)   Small bowel obstruction due to adhesions [facility])    Time spent: 62min    Tomeka Kantner MD PhD  [facility] Pager [phone]. If 7PM-7AM, please contact night-coverage at [email or web address], password [facility] 08/19/2015, 1:49 PM  LOS: 5 days

## 2015-08-20 ENCOUNTER — Encounter: Payer: Medicare Other | Admitting: Internal Medicine

## 2015-08-20 DIAGNOSIS — Z66 Do not resuscitate: Secondary | ICD-10-CM | POA: Insufficient documentation

## 2015-08-20 DIAGNOSIS — Z515 Encounter for palliative care: Secondary | ICD-10-CM

## 2015-08-20 DIAGNOSIS — R634 Abnormal weight loss: Secondary | ICD-10-CM

## 2015-08-20 LAB — BASIC METABOLIC PANEL
ANION GAP: 9 (ref 5–15)
BUN: 23 mg/dL — ABNORMAL HIGH (ref 6–20)
CALCIUM: 8.2 mg/dL — AB (ref 8.9–10.3)
CHLORIDE: 104 mmol/L (ref 101–111)
CO2: 26 mmol/L (ref 22–32)
Creatinine, Ser: 1.13 mg/dL (ref 0.61–1.24)
GFR calc non Af Amer: 55 mL/min — ABNORMAL LOW (ref >60)
GLUCOSE: 110 mg/dL — AB (ref 65–99)
Potassium: 3.9 mmol/L (ref 3.5–5.1)
Sodium: 139 mmol/L (ref 135–145)

## 2015-08-20 LAB — CBC
HEMATOCRIT: 41.4 % (ref 39.0–52.0)
HEMOGLOBIN: 13.1 g/dL (ref 13.0–17.0)
MCH: 32 pg (ref 26.0–34.0)
MCHC: 31.6 g/dL (ref 30.0–36.0)
MCV: 101.2 fL — AB (ref 78.0–100.0)
Platelets: 135 10*3/uL — ABNORMAL LOW (ref 150–400)
RBC: 4.09 MIL/uL — ABNORMAL LOW (ref 4.22–5.81)
RDW: 13.1 % (ref 11.5–15.5)
WBC: 10.1 10*3/uL (ref 4.0–10.5)

## 2015-08-20 LAB — MAGNESIUM: Magnesium: 1.8 mg/dL (ref 1.7–2.4)

## 2015-08-20 MED ORDER — POLYETHYLENE GLYCOL 3350 17 G PO PACK
17.0000 g | PACK | Freq: Two times a day (BID) | ORAL | Status: DC
  Administered 2015-08-20 – 2015-08-21 (×3): 17 g via ORAL
  Filled 2015-08-20 (×3): qty 1

## 2015-08-20 MED ORDER — HYDROMORPHONE HCL 2 MG PO TABS: 2.0000 mg | ORAL_TABLET | ORAL | Status: DC | PRN

## 2015-08-20 NOTE — Consult Note (Signed)
Consultation Note Date: 08/20/2015   Patient Name: [name]  DOB: 1923-06-15  MRN: [id number]  Age / Sex: 82 y.o., male  PCP: Susy Frizzle, MD Referring Physician: Theodis Blaze, MD  Reason for Consultation: Disposition, Establishing goals of care and Pain control    Clinical Assessment/Narrative: 82 yo male with dementia, afib (not on anticoagulation), Tachy-brady syndrome - pace maker in place, was admitted on 1/12 to [facility] with SBO secondary to adhesions and severe constipation.  He is not a surgical candidate.  Fortunately his obstructive symptoms have cleared and he is now tolerating clears.    He is currently in a med-surg bed with a sitter at bedside for safety as the patient is confused (demented).  He is on a clear liquid diet with IVF at 75/ml/hour.  His weight is down to 137 lbs.  PT evaluation is pending.  Reportedly he was able to walk with a walker prior to admission.  He is hard of hearing, but responds to my questions.  He reports some pain in his epigastrum, and says he has the taste of grease in his mouth.  He asks me where he is.  I spoke with his daughter, Darnelle Bos, who is also his HCPOA.  Mr. Corlis has a son as well, but he is not allowed (legally) to see his father without his sister, Darnelle Bos.  Ann cares for her mother who has dementia and lives at [facility] (SNF).  She requests her father be placed at [facility] if possible.    Lelon Frohlich describes her father has having some confusion and forgetfulness but states he has had more delirium and confusion than expected while in the hospital.  She mentions his behavior is negatively effected by opiate medications.  She understands that his ability to tolerate a solid diet is key to his recovery and prognosis.  If he ia able to stand and walk after SNF she would like for him to live with her.  Contacts/Participants in Discussion: Spoke with Darnelle Bos over  the phone. Primary Decision Maker: daughter Relationship to Patient daugher HCPOA: Yes   SUMMARY OF RECOMMENDATIONS Assuming he progresses and can tolerate a solid diet - d/c to SNF with Palliative follow up as needed. Bowel Regimen at SNF; Monitor potassium (maintain 4.5 - 5.0); minimize/eliminate narcotics. Conversely if he does not tolerate a diet, given his dementia he will likely be eligible for hospice services.  Code Status/Advance Care Planning: DNR    Code Status Orders        Start     Ordered   08/15/15 0134  Do not attempt resuscitation (DNR)   Continuous    Question Answer Comment  In the event of cardiac or respiratory ARREST Do not call a "code blue"   In the event of cardiac or respiratory ARREST Do not perform Intubation, CPR, defibrillation or ACLS   In the event of cardiac or respiratory ARREST Use medication by any route, position, wound care, and other measures to relive pain and suffering. May use oxygen, suction and manual treatment of airway obstruction as needed for comfort.      08/15/15 0138    Code Status History    Date Active Date Inactive Code Status Order ID Comments User Context   08/07/2013 12:56 PM 08/07/2013  5:19 PM Full Code [id number]  [name], MD Inpatient   08/29/2012 12:00 AM 09/02/2012  4:22 PM Full Code [id number]  Kaylyn Lim, RN Inpatient    Advance Directive  Documentation        Most Recent Value   Type of Advance Directive  Out of facility DNR (pink MOST or yellow form)   Pre-existing out of facility DNR order (yellow form or pink MOST form)  Yellow form placed in chart (order not valid for inpatient use)   "MOST" Form in Place?        Other Directives:None  Symptom Management:   Increase bowel regimen.  Change from IV dilaudid to low dose PO in an attempt to decrease the opiate load.  Palliative Prophylaxis:   Aspiration, Bowel Regimen, Oral Care and Turn Reposition  Additional Recommendations (Limitations, Scope,  Preferences):  Recommend daily bowel regimen to be given at SNF.  Would decrease opiates as much as possible.  Follow potassium. Its already added to IVF.   Ideally it should be between 4.5 - 5.0 to help prevent future SBO.   Psycho-social/Spiritual:  Support System: Adequate Desire for further Chaplaincy support:No Additional Recommendations: Caregiving  Support/Resources  Prognosis:  If patient is unable to tolerate a solid diet, given his dementia, and marked deconditioning he likely has 6 months or less.  He is at high risk for an acute event.  Discharge Planning: [facility] for rehab with Palliative care service follow-up    Chief Complaint/ Primary Diagnoses: Present on Admission:  . SBO (small bowel obstruction) ([place]) . Small bowel obstruction due to adhesions [facility])  I have reviewed the medical record, interviewed the patient and family, and examined the patient. The following aspects are pertinent.  Past Medical History  Diagnosis Date  . Hyperlipidemia   . Atrial fibrillation (HCC)     permanent afib  . Long-term (current) use of anticoagulants     previously on coumadin, stopped by Dr [name] due to frequent falls  . BRADYCARDIA-TACHYCARDIA SYNDROME 2007    s/p PPM by Dr [name]  . BPH (benign prostatic hyperplasia)   . Glaucoma   . Pure hypercholesterolemia   . DNR (do not resuscitate)   . Dementia    Social History   Social History  . Marital Status: Married    Spouse Name: N/A  . Number of Children: N/A  . Years of Education: N/A   Social History Main Topics  . Smoking status: Former Smoker -- 1.00 packs/day for 25 years    Types: Cigarettes    Quit date: 08/02/1961  . Smokeless tobacco: Never Used  . Alcohol Use: No  . Drug Use: No  . Sexual Activity: Not Asked   Other Topics Concern  . None   Social History Narrative   Family History  Problem Relation Age of Onset  . Heart attack Father    Scheduled Meds: . chlorhexidine  gluconate  15 mL Mouth/Throat QID  . digoxin  0.125 mg Intravenous Daily  . famotidine (PEPCID) IV  20 mg Intravenous Q12H  . metoprolol  5 mg Intravenous 4 times per day  . milk and molasses  1 enema Rectal BID  . pantoprazole (PROTONIX) IV  40 mg Intravenous Q12H  . polyethylene glycol  17 g Oral BID  . tamsulosin  0.4 mg Oral QHS   Continuous Infusions: . dextrose 5 % with KCl 20 mEq / L 20 mEq (08/19/15 1409)   PRN Meds:.acetaminophen **OR** acetaminophen, HYDROmorphone (DILAUDID) injection, labetalol, loteprednol, ondansetron **OR** ondansetron (ZOFRAN) IV Medications Prior to Admission:  Prior to Admission medications   Medication Sig Start Date End Date Taking? Authorizing Provider  acetaminophen (TYLENOL) 325 MG tablet Take  650 mg by mouth See admin instructions. Take 2 tablets (650 mg) 2 times daily, may take 2 more tablets mid-day as needed for pain   Yes Historical Provider, MD  aspirin EC 81 MG tablet Take 81 mg by mouth daily.   Yes Historical Provider, MD  digoxin (LANOXIN) 0.125 MG tablet Take 1 tablet (125 mcg total) by mouth daily. 02/05/15  Yes Burtis Junes, NP  loteprednol (LOTEMAX) 0.5 % ophthalmic suspension Place 1 drop into both eyes daily as needed (itching eyes).    Yes Historical Provider, MD  metoprolol succinate (TOPROL-XL) 25 MG 24 hr tablet TAKE 1 TABLET (25 MG TOTAL) BY MOUTH DAILY. TAKE WITH OR IMMEDIATELY FOLLOWING A MEAL. 04/10/15  Yes [name], MD  polyethylene glycol (MIRALAX / GLYCOLAX) packet Take 17 g by mouth daily as needed (constipation).    Yes Historical Provider, MD  simvastatin (ZOCOR) 40 MG tablet TAKE 1 TABLET BY MOUTH AT BEDTIME 06/05/15  Yes Susy Frizzle, MD  tamsulosin (FLOMAX) 0.4 MG CAPS capsule Take 0.4 mg by mouth at bedtime.   Yes Historical Provider, MD   Allergies  Allergen Reactions  . Tramadol Other (See Comments)    Extreme sleepiness and irritable    Review of Systems:  Unable to give secondary to  confusion.  Physical Exam  Wd, cachectically thin, elderly male, HOH, but able to respond to my voice. Chest:  Mild expiratory wheeze, no increased work of breathing. CV:  S1, S2, no LEE, no JVD Abdomen:  Thin, firm, tender to palpation in epigastrum, decreased bowel sounds   Vital Signs: BP 135/77 mmHg  Pulse 83  Temp(Src) 97.6 F (36.4 C) (Axillary)  Resp 18  Ht 5\' 9"  (1.753 m)  Wt 62.143 kg (137 lb)  BMI 20.22 kg/m2  SpO2 100%  SpO2: SpO2: 100 % O2 Device:SpO2: 100 % O2 Flow Rate: .   IO: Intake/output summary:  Intake/Output Summary (Last 24 hours) at 08/20/15 1016 Last data filed at 08/20/15 [id number]  Gross per 24 hour  Intake   1710 ml  Output    575 ml  Net   1135 ml    LBM: Last BM Date: 08/19/15 Baseline Weight: Weight: 62.143 kg (137 lb) Most recent weight: Weight: 62.143 kg (137 lb)      Palliative Assessment/Data:  Flowsheet Rows        Most Recent Value   Intake Tab    Referral Department  Hospitalist   Unit at Time of Referral  Med/Surg Unit   Palliative Care Primary Diagnosis  Other (Comment) [SBO]   Date Notified  08/19/15   Palliative Care Type  New Palliative care   Reason for referral  Clarify Goals of Care   Date of Admission  08/14/15   # of days IP prior to Palliative referral  5   Clinical Assessment    Psychosocial & Spiritual Assessment    Palliative Care Outcomes       Additional Data Reviewed:  CBC:    Component Value Date/Time   WBC 10.1 08/20/2015 0510   HGB 13.1 08/20/2015 0510   HCT 41.4 08/20/2015 0510   PLT 135* 08/20/2015 0510   MCV 101.2* 08/20/2015 0510   NEUTROABS 3.4 08/15/2015 0540   LYMPHSABS 0.2* 08/15/2015 0540   MONOABS 0.6 08/15/2015 0540   EOSABS 0.0 08/15/2015 0540   BASOSABS 0.0 08/15/2015 0540   Comprehensive Metabolic Panel:    Component Value Date/Time   NA 139 08/20/2015 0510   K 3.9 08/20/2015  0510   CL 104 08/20/2015 0510   CO2 26 08/20/2015 0510   BUN 23* 08/20/2015 0510   CREATININE 1.13  08/20/2015 0510   CREATININE 1.34 04/16/2014 1544   GLUCOSE 110* 08/20/2015 0510   CALCIUM 8.2* 08/20/2015 0510   AST 22 08/15/2015 0540   ALT 12* 08/15/2015 0540   ALKPHOS 46 08/15/2015 0540   BILITOT 0.8 08/15/2015 0540   PROT 6.5 08/15/2015 0540   ALBUMIN 3.9 08/15/2015 0540     Time In: 7:50 Time Out: 9:00 Time Total: 70 min. Greater than 50%  of this time was spent counseling and coordinating care related to the above assessment and plan.  Signed by:  Melton Alar, PA-C  08/20/2015, 10:16 AM  Please contact Palliative Medicine Team phone at [phone] for questions and concerns.

## 2015-08-20 NOTE — NC FL2 (Signed)
[place] LEVEL OF CARE SCREENING TOOL     IDENTIFICATION  Patient Name: [name] Birthdate: [birth year] Sex: male Admission Date (Current Location): 08/14/2015  [facility] and [place] Number:  Herbalist and Address:  [facility],  [place] [address] [place]      Provider Number: [id number]  Attending Physician Name and Address:  Theodis Blaze, MD  Relative Name and Phone Number:       Current Level of Care: Hospital Recommended Level of Care: [place] Prior Approval Number:    Date Approved/Denied:   PASRR Number: [id number]  Discharge Plan: SNF    Current Diagnoses: Patient Active Problem List   Diagnosis Date Noted  . Palliative care encounter   . DNR (do not resuscitate)   . Loss of weight   . Small bowel obstruction due to adhesions ([place]) 08/15/2015  . SBO (small bowel obstruction) ([place]) 08/14/2015  . BPH (benign prostatic hyperplasia)   . Partial small bowel obstruction ([place]) 08/28/2012  . AKI (acute kidney injury) ([place]) 08/28/2012  . Abdominal pain, acute 08/28/2012  . Nausea and vomiting 08/28/2012  . Dehydration 08/28/2012  . Pacemaker-Medtronic 12/22/2010  . HYPERLIPIDEMIA 08/28/2010  . Atrial fibrillation ([place]) 08/28/2010  . BRADYCARDIA-TACHYCARDIA SYNDROME 08/28/2010    Orientation RESPIRATION BLADDER Height & Weight    Place, Self  Normal (Room air)     137 lbs.  BEHAVIORAL SYMPTOMS/MOOD NEUROLOGICAL BOWEL NUTRITION STATUS        Diet (Regular)  AMBULATORY STATUS COMMUNICATION OF NEEDS Skin   Extensive Assist Verbally Normal                       Personal Care Assistance Level of Assistance  Bathing, Feeding, Dressing Bathing Assistance: Maximum assistance Feeding assistance: Limited assistance Dressing Assistance: Maximum assistance     Functional Limitations Info  Hearing   Hearing Info: Adequate      SPECIAL CARE FACTORS FREQUENCY  PT (By licensed PT), OT  (By licensed OT)     PT Frequency: 5 X weekly OT Frequency: 5 X weekly            Contractures Contractures Info: Not present    Additional Factors Info  Code Status, Allergies Code Status Info: DNR Allergies Info: Tramadol           Current Medications (08/20/2015):  This is the current hospital active medication list Current Facility-Administered Medications  Medication Dose Route Frequency Provider Last Rate Last Dose  . acetaminophen (TYLENOL) tablet 650 mg  650 mg Oral Q6H PRN Lily Kocher, MD       Or  . acetaminophen (TYLENOL) suppository 650 mg  650 mg Rectal Q6H PRN Lily Kocher, MD      . chlorhexidine gluconate (PERIDEX) 0.12 % solution 15 mL  15 mL Mouth/Throat QID Domenic Polite, MD   15 mL at 08/20/15 1036  . dextrose 5 % with KCl 20 mEq / L  infusion  20 mEq Intravenous Continuous Florencia Reasons, MD 75 mL/hr at 08/19/15 1409 20 mEq at 08/19/15 1409  . digoxin (LANOXIN) 0.25 MG/ML injection 0.125 mg  0.125 mg Intravenous Daily Domenic Polite, MD   0.125 mg at 08/20/15 1036  . famotidine (PEPCID) IVPB 20 mg premix  20 mg Intravenous Q12H Lily Kocher, MD   20 mg at 08/20/15 1039  . HYDROmorphone (DILAUDID) tablet 2 mg  2 mg Oral Q4H PRN Melton Alar, PA-C      .  labetalol (NORMODYNE,TRANDATE) injection 10 mg  10 mg Intravenous Q4H PRN Lily Kocher, MD      . loteprednol (LOTEMAX) 0.5 % ophthalmic suspension 1 drop  1 drop Both Eyes Daily PRN Lily Kocher, MD      . metoprolol (LOPRESSOR) injection 5 mg  5 mg Intravenous 4 times per day Domenic Polite, MD   5 mg at 08/20/15 1235  . ondansetron (ZOFRAN) tablet 4 mg  4 mg Oral Q6H PRN Lily Kocher, MD       Or  . ondansetron [facility]) injection 4 mg  4 mg Intravenous Q6H PRN Lily Kocher, MD      . pantoprazole (PROTONIX) injection 40 mg  40 mg Intravenous Q12H Domenic Polite, MD   40 mg at 08/20/15 1036  . polyethylene glycol (MIRALAX / GLYCOLAX) packet 17 g  17 g Oral BID Melton Alar, PA-C   17 g at 08/20/15 1037  .  tamsulosin (FLOMAX) capsule 0.4 mg  0.4 mg Oral QHS Lily Kocher, MD   0.4 mg at 08/19/15 2234     Discharge Medications: Please see discharge summary for a list of discharge medications.  Relevant Imaging Results:  Relevant Lab Results:   Additional Information SSN: [id number]  Linna Darner, LCSW

## 2015-08-20 NOTE — Clinical Social Work Note (Signed)
CSW left a msg for pt's daughter, Darnelle Bos [phone] in order to complete assessment as pt is still confused/lethargic. Per palliative care, pt's family would like pt placed at [facility], SNF. Pt's wife is also a resident at this facility. CSW completed FL2 and faxed out clinical to [facility] for review.    Cindra Presume, LCSW [phone] Hospital psychiatric & 5E, 5W [id number] Licensed Clinical Social Worker

## 2015-08-20 NOTE — Progress Notes (Signed)
  Subjective: NG out.  Tolerating diet.  Had good bowel movements after enemas yesterday.  No nausea or vomiting.  Still very confused but much more pleasant and less agitated. Potassium 3.9.  Creatinine 1.13.  WBC 10,100.  Hemoglobin 13.1.  Glucose 110.  Objective: Vital signs in last 24 hours: Temp:  [97.6 F (36.4 C)-98 F (36.7 C)] 97.6 F (36.4 C) (01/18 0545) Pulse Rate:  [82-100] 83 (01/18 0545) Resp:  [16-18] 18 (01/18 0545) BP: (110-154)/(63-77) 135/77 mmHg (01/18 0545) SpO2:  [100 %] 100 % (01/18 0545) Last BM Date: 15-Sep-2015  Intake/Output from previous day: September 14, 2022 0701 - 01/18 0700 In: 1950 [I.V.:1500; IV Piggyback:450] Out: 350 [Urine:350] Intake/Output this shift: Total I/O In: 750 [I.V.:600; IV Piggyback:150] Out: 100 [Urine:100]  General appearance: Elderly.  Confused.  Marketed deconditioning.  No acute distress. Resp: clear to auscultation bilaterally GI: Soft.  Nontender.  Active bowel sounds.  Lab Results:   Recent Labs  09-15-2015 0355 08/20/15 0510  WBC 7.5 10.1  HGB 13.4 13.1  HCT 42.3 41.4  PLT 122* 135*   BMET  Recent Labs  09-15-2015 0355 08/20/15 0510  NA 145 139  K 3.4* 3.9  CL 107 104  CO2 29 26  GLUCOSE 117* 110*  BUN 25* 23*  CREATININE 1.03 1.13  CALCIUM 8.5* 8.2*   PT/INR No results for input(s): LABPROT, INR in the last 72 hours. ABG No results for input(s): PHART, HCO3 in the last 72 hours.  Invalid input(s): PCO2, PO2  Studies/Results: Dg Abd 2 Views  Sep 15, 2015  CLINICAL DATA:  Follow-up bowel obstruction EXAM: ABDOMEN - 2 VIEW COMPARISON:  August 17 2015 FINDINGS: Extensive bowel content is identified throughout colon. There are a few dilated small bowel loops in the mid abdomen. Scattered air is identified throughout colon. A nasogastric tube is identified distal tip in the stomach. Degenerative joint changes of the spine are identified. IMPRESSION: A few dilated small bowel loops in the mid abdomen with scattered air  identified throughout colon. This could be due to resolving or partial small bowel obstruction. Extensive bowel content noted throughout colon. Electronically Signed   By: Abelardo Diesel M.D.   On: 09-15-15 10:55    Anti-infectives: Anti-infectives    None      Assessment/Plan:  Recurrent SBO; prior laparotomy and hernia repair    Very poor surgical candidate.    Fortunately, obstructive symptoms have resolved    We'll sign off.  Chronic AF,  Senile dementia with cognitive dysfunction Tachy-brady syndrome with PTVP Anemia/Thrombocytopenia DNR Antibiotics: None DVT: I have added SCD   LOS: 6 days    Leobardo Granlund M 08/20/2015

## 2015-08-20 NOTE — Evaluation (Signed)
Physical Therapy Evaluation Patient Details Name: BERKLEY MAYBIN MRN: [id number] DOB: 1922/08/24 Today's Date: 08/20/2015   History of Present Illness  Pt admitted with dx of SBO and hx of A-fib, pacemaker, glaucoma and dementia  Clinical Impression  Pt admitted as above and presenting with functional mobility limitations related to dementia related cognitive deficits.  Pt demonstrating sufficient strength to mobilize - including putting self back to bed x 4 following moves to sitting at EOB.  Pt is from SNF setting and will need follow up care at same.    Follow Up Recommendations SNF    Equipment Recommendations  None recommended by PT    Recommendations for Other Services       Precautions / Restrictions Precautions Precautions: Fall Restrictions Weight Bearing Restrictions: No      Mobility  Bed Mobility Overal bed mobility: Needs Assistance Bed Mobility: Supine to Sit;Sit to Supine     Supine to sit: Mod assist;Min assist Sit to supine: Min guard   General bed mobility comments: Multimodal cues to transfer to EOB.  Each time pt to EOB, states "I need to be covered up"  and pulls self back into bed.  Attempted x 4.  Transfers                 General transfer comment: NT - pt agreeable to OOB until attempted and pt repeatedly pulls self back into bed  Ambulation/Gait                Stairs            Wheelchair Mobility    Modified Rankin (Stroke Patients Only)       Balance                                             Pertinent Vitals/Pain Pain Assessment: No/denies pain    Home Living Family/patient expects to be discharged to:: Skilled nursing facility                 Additional Comments: Per RN, family is investigating move to [facility] where wife is resident    Prior Function Level of Independence: Needs assistance   Gait / Transfers Assistance Needed: RW      Comments: Pt was resident of [facility]  memory unit     Hand Dominance        Extremity/Trunk Assessment   Upper Extremity Assessment: Overall WFL for tasks assessed           Lower Extremity Assessment: Overall WFL for tasks assessed      Cervical / Trunk Assessment: Kyphotic  Communication   Communication: No difficulties  Cognition Arousal/Alertness: Awake/alert Behavior During Therapy: Flat affect Overall Cognitive Status: History of cognitive impairments - at baseline                      General Comments      Exercises        Assessment/Plan    PT Assessment    PT Diagnosis Difficulty walking (Pt ltd participation)   PT Problem List    PT Treatment Interventions     PT Goals (Current goals can be found in the Care Plan section) Acute Rehab PT Goals Patient Stated Goal: I need to be covered up PT Goal Formulation: Patient unable to participate in goal setting Time For Goal Achievement:  09/25/2015 Potential to Achieve Goals: Fair    Frequency     Barriers to discharge        Co-evaluation               End of Session   Activity Tolerance: Other (comment) (Cognitive status) Patient left: in bed;with nursing/sitter in room;with call bell/phone within reach Nurse Communication: Other (comment) (Pt ltd participation)         Time: [id number] PT Time Calculation (min) (ACUTE ONLY): 11 min   Charges:   PT Evaluation $PT Eval Low Complexity: 1 Procedure     PT G Codes:        Mahagony Grieb 09/12/15, 1:02 PM

## 2015-08-20 NOTE — Progress Notes (Signed)
Pt agreeable and polite at 2200 while giving meds and enema. Pt responded appropriately to treatment. Pt bathed and shaved. Took off mittens last night at 2200. Pt did call out in the night for 'Brandy', but did not try to harm staff, get out of bed, or pull tubes.  Restraints order expires during 0400 hour. Pt resting comfortably. Restraints removed. Safety sitter still in place. Will continue to monitor.  Roselind Rily

## 2015-08-20 NOTE — Progress Notes (Signed)
Patient ID: [name], male   DOB: [birth year], 82 y.o.   MRN: [id number]  [facility] PROGRESS NOTE  [name] [id number] DOB: Jun 17, 1923 DOA: 08/14/2015 PCP: Odette Fraction, MD   Brief narrative:    82 y.o. gentleman with a history of chronic atrial fibrillation (no anticoagulation due to history of falls, bleeding risk), glaucoma, and age-related cognitive impairment (his daughter denies a formal diagnosis of dementia) who is a resident of an assisted living facility.He was in his baseline state of health until one day PTA when he developed nausea and vomiting.   ED evaluation in [facility] concerning for mild AKI and CT findings consistent with small bowel obstruction. Hospitalist asked to admit; general surgery to consult.  Assessment/Plan:    1. SBO - due to adhesions, improving based on KuB - ngt removed on 1/17 per CCS, started clears.  - obstructive symptoms resolved, PCT consulted - plan to d/c SNF  2. Constipation - allow enema if needed   3. Permanent Afib - rate controlled, continue IV BB and Digoxin - not on anticoagulation, s/p PPM for tachy-brady syndrome  4. Cognitive dysfunction/Senile Dementia - with confusion - in wrist restraints, sitter at bedside  5. Anemia of chronic disease, IDS/thrombocytopenia - dilution to some extent, hold lovenox   6. Hypokalemia - supplemented and WNL  7. Hyperchloremic hypernatremia - resolved   DVT prophylaxis - SCD's  Code Status: DNR Family Communication:  No family at bedside  Disposition Plan: SNF 11/19 if tolerating diet   IV access:  Peripheral IV  Procedures and diagnostic studies:    Dg Shoulder Right  07/22/2015  CLINICAL DATA:  Status post fall today onto a carpeted floor. Initial encounter. EXAM: RIGHT SHOULDER - 2+ VIEW COMPARISON:  None. FINDINGS: No acute bony or joint abnormality is identified. Bulky acromioclavicular degenerative change is seen. Imaged right lung and ribs  are unremarkable. Paced device is noted. IMPRESSION: No acute abnormality. Bulky acromioclavicular osteoarthritis. Electronically Signed   By: Inge Rise M.D.   On: 07/22/2015 12:20   Dg Elbow Complete Right  07/22/2015  CLINICAL DATA:  Post fall this morning with right elbow pain. History of dementia. EXAM: RIGHT ELBOW - COMPLETE 3+ VIEW COMPARISON:  None. FINDINGS: No displaced fracture or elbow joint effusion. Peripherally corticated ossicles are noted about both the medial and lateral epicondyles. Regional soft tissues appear normal. No radiopaque foreign body. IMPRESSION: No acute findings. Specifically, no displaced fracture or elbow joint effusion Electronically Signed   By: Sandi Mariscal M.D.   On: 07/22/2015 13:49   Dg Abd 1 View  08/17/2015  CLINICAL DATA:  Abdominal pain and distention EXAM: ABDOMEN - 1 VIEW COMPARISON:  08/16/2015 FINDINGS: The LEFT lateral quadrant of the abdomen excluded. NG tube in stomach. No dilated loops of large or small bowel. Gas and the rectum. Moderate volume stool throughout the colon on the RIGHT colon. IMPRESSION: 1. No evidence of bowel obstruction. 2. Moderate to large volume stool. Electronically Signed   By: Suzy Bouchard M.D.   On: 08/17/2015 10:33   Ct Head Wo Contrast  07/22/2015  CLINICAL DATA:  Fall over walker onto carpeted floor, now with abrasion involving the right eye. EXAM: CT HEAD WITHOUT CONTRAST TECHNIQUE: Contiguous axial images were obtained from the base of the skull through the vertex without intravenous contrast. COMPARISON:  01/23/2015 ; 12/15/2014 FINDINGS: Similar findings of advanced atrophy with sulcal prominence and centralized volume loss with commensurate ex vacuo dilatation of the ventricular system.  Extensive, nearly confluent, periventricular hypodensities compatible microvascular ischemic disease, grossly unchanged. Given extensive background parenchymal abnormalities, there is no CT evidence superimposed acute large  territory infarct. No intraparenchymal extra-axial mass or hemorrhage. Unchanged size and configuration of ventricles and basilar cisterns. No midline shift. Intracranial atherosclerosis. Limited visualization of the paranasal sinuses and mastoid air cells is normal. Regional soft tissues appear normal with special attention paid to the right orbit. No radiopaque foreign body. Post bilateral cataract surgery. No displaced calvarial fracture. IMPRESSION: Similar findings of advanced atrophy and microvascular ischemic disease without acute intracranial process. Electronically Signed   By: Sandi Mariscal M.D.   On: 07/22/2015 12:10   Ct Abdomen Pelvis W Contrast  08/14/2015  CLINICAL DATA:  82 year old male with generalized abdominal pain for 3 days. Nausea and vomiting. EXAM: CT ABDOMEN AND PELVIS WITH CONTRAST TECHNIQUE: Multidetector CT imaging of the abdomen and pelvis was performed using the standard protocol following bolus administration of intravenous contrast. CONTRAST:  29mL OMNIPAQUE IOHEXOL 300 MG/ML  SOLN COMPARISON:  CT 08/28/2012 FINDINGS: Lower chest: The included lung bases are clear. Pacemaker wires partially included. Heart upper limits normal in size. Liver: Unchanged tiny hypodensity in the right lobe, incompletely characterize but likely cyst. Suspicious lesion. Hepatobiliary: Gallbladder physiologically distended. Calcified gallstones are seen. No biliary dilatation. Pancreas: Atrophic without ductal dilatation or surrounding inflammation. Spleen: Normal. Adrenal glands: No nodule. Kidneys: Symmetric renal enhancement. No hydronephrosis. There is a 1.9 cm simple cyst in the posterior interpolar right kidney. Trace of perinephric fluid is unchanged. Stomach/Bowel: Stomach markedly distended with fluid. Fluid in the distal esophagus. Small bowel loops are diffusely dilated and fluid-filled. Transition point in the right mid abdomen, best appreciated on coronal image 27. Small bowel loops distal to  this are decompressed. There is adjacent mesenteric edema and small amount of free fluid. No pneumatosis. No free air. Small bowel loops in the right lower quadrant extend towards the right inguinal canal, unchanged in appearance from prior exam. Moderate stool burden throughout the colon. Appendix tentatively identified and normal. Vascular/Lymphatic: No retroperitoneal adenopathy. Abdominal aorta is normal in caliber. Moderate atherosclerosis without aneurysm. Reproductive: Prostate gland prominent size. Bladder: Distended without wall thickening. Other: Small amount of free fluid in the pelvis is likely reactive. There is no free air. Probable previous right inguinal hernia repair. Musculoskeletal: Multilevel degenerative change throughout the lumbar spine There are no acute or suspicious osseous abnormalities. IMPRESSION: Small-bowel obstruction with transition point in the right mid abdomen. This is similar in location to prior exam, likely secondary to adhesions. Mesenteric edema and small amount of free fluid, no pneumatosis or perforation. Electronically Signed   By: Jeb Levering M.D.   On: 08/14/2015 20:40   Dg Knee Complete 4 Views Right  07/22/2015  CLINICAL DATA:  Status post fall today. Right knee pain. Initial encounter. EXAM: RIGHT KNEE - COMPLETE 4+ VIEW COMPARISON:  None. FINDINGS: There is no acute bony or joint abnormality. No joint effusion is seen. No notable degenerative disease is identified. Chondrocalcinosis is noted. IMPRESSION: No acute abnormality. Electronically Signed   By: Inge Rise M.D.   On: 07/22/2015 12:31   Dg Abd 2 Views  08/19/2015  CLINICAL DATA:  Follow-up bowel obstruction EXAM: ABDOMEN - 2 VIEW COMPARISON:  August 17 2015 FINDINGS: Extensive bowel content is identified throughout colon. There are a few dilated small bowel loops in the mid abdomen. Scattered air is identified throughout colon. A nasogastric tube is identified distal tip in the stomach.  Degenerative joint  changes of the spine are identified. IMPRESSION: A few dilated small bowel loops in the mid abdomen with scattered air identified throughout colon. This could be due to resolving or partial small bowel obstruction. Extensive bowel content noted throughout colon. Electronically Signed   By: Abelardo Diesel M.D.   On: 08/19/2015 10:55   Dg Abd 2 Views  08/16/2015  CLINICAL DATA:  Abdominal pain.  Follow-up small bowel obstruction. EXAM: ABDOMEN - 2 VIEW COMPARISON:  CT, 08/14/2015. FINDINGS: Dilated small bowel is seen in the left upper quadrant. Maximum diameter is measured at 4.7 cm, which is magnified by the AP supine technique. There is stool seen within colon.  There is no colonic dilation. No free air is noted on the decubitus view. There are few scattered air-fluid levels. Nasogastric tube projects in the mid stomach. IMPRESSION: 1. Persistent dilated small bowel left upper quadrant. However, overall, the small bowel obstruction appears improved. There is no free air. Electronically Signed   By: Lajean Manes M.D.   On: 08/16/2015 10:39   Dg Hand Complete Right  07/22/2015  CLINICAL DATA:  Status post fall today with an abrasion on the posterior aspect of the right hand. Initial encounter. EXAM: RIGHT HAND - COMPLETE 3+ VIEW COMPARISON:  None. FINDINGS: No acute bony or joint abnormality is identified. Postoperative change is seen about the dorsal margin of the PIP joint of the right long fingers. Scattered mild degenerative change is most notable about the DIP joint of the index and long fingers. Chondrocalcinosis is identified. Soft tissues are unremarkable. IMPRESSION: No acute abnormality. Electronically Signed   By: Inge Rise M.D.   On: 07/22/2015 12:19    Medical Consultants:  Surgery PCT  Other Consultants:  PT  IAnti-Infectives:   None  Faye Ramsay, MD  [place] Pager [phone]  If 7PM-7AM, please contact night-coverage [email or web address] Password  [email or web address] 08/20/2015, 2:16 PM   LOS: 6 days   HPI/Subjective: No events overnight.   Objective: Filed Vitals:   08/20/15 0017 08/20/15 0545 08/20/15 1039 08/20/15 1218  BP: 123/67 135/77 111/76 111/57  Pulse: 99 83 94 89  Temp:  97.6 F (36.4 C)    TempSrc:  Axillary    Resp:  18 16 18   Height:      Weight:      SpO2:  100% 100% 97%    Intake/Output Summary (Last 24 hours) at 08/20/15 1416 Last data filed at 08/20/15 1145  Gross per 24 hour  Intake   1310 ml  Output    575 ml  Net    735 ml    Exam:   General:  Pt is not in acute distress  Cardiovascular: Regular rate and rhythm, no rubs, no gallops  Respiratory: Clear to auscultation bilaterally, diminished breath sounds at bases   Abdomen: Soft, non tender, non distended, bowel sounds present, no guarding  Data Reviewed: Basic Metabolic Panel:  Recent Labs Lab 08/16/15 0621 08/17/15 0836 08/18/15 0415 08/19/15 0355 08/20/15 0510  NA 145 145 149* 145 139  K 3.4* 3.4* 3.8 3.4* 3.9  CL 108 107 113* 107 104  CO2 27 27 27 29 26   GLUCOSE 98 83 101* 117* 110*  BUN 35* 34* 34* 25* 23*  CREATININE 1.10 1.02 1.10 1.03 1.13  CALCIUM 8.7* 8.6* 8.7* 8.5* 8.2*  MG  --   --  2.2  --  1.8   Liver Function Tests:  Recent Labs Lab 08/14/15 1850 08/15/15 0540  AST 30 22  ALT 16*  12*  ALKPHOS 48 46  BILITOT 0.8 0.8  PROT 7.3 6.5  ALBUMIN 4.1 3.9    Recent Labs Lab 08/14/15 1850  LIPASE 21   CBC:  Recent Labs Lab 08/14/15 1850 08/15/15 0540 08/16/15 0621 08/17/15 0836 08/19/15 0355 08/20/15 0510  WBC 6.3 4.2 4.5 6.2 7.5 10.1  NEUTROABS 5.6 3.4  --   --   --   --   HGB 13.1 11.8* 11.1* 12.2* 13.4 13.1  HCT 41.4 37.1* 35.7* 39.0 42.3 41.4  MCV 101.7* 103.9* 104.4* 103.4* 101.9* 101.2*  PLT 150 132* 100* 109* 122* 135*   Scheduled Meds: . chlorhexidine gluconate  15 mL Mouth/Throat QID  . digoxin  0.125 mg Intravenous Daily  . famotidine (PEPCID) IV  20 mg Intravenous Q12H  . metoprolol  5 mg  Intravenous 4 times per day  . pantoprazole (PROTONIX) IV  40 mg Intravenous Q12H  . polyethylene glycol  17 g Oral BID  . tamsulosin  0.4 mg Oral QHS   Continuous Infusions: . dextrose 5 % with KCl 20 mEq / L 20 mEq (08/19/15 1409)

## 2015-08-21 LAB — CBC
HEMATOCRIT: 38.3 % — AB (ref 39.0–52.0)
Hemoglobin: 12.4 g/dL — ABNORMAL LOW (ref 13.0–17.0)
MCH: 32.5 pg (ref 26.0–34.0)
MCHC: 32.4 g/dL (ref 30.0–36.0)
MCV: 100.5 fL — AB (ref 78.0–100.0)
PLATELETS: 147 10*3/uL — AB (ref 150–400)
RBC: 3.81 MIL/uL — ABNORMAL LOW (ref 4.22–5.81)
RDW: 13.2 % (ref 11.5–15.5)
WBC: 10 10*3/uL (ref 4.0–10.5)

## 2015-08-21 LAB — BASIC METABOLIC PANEL
ANION GAP: 7 (ref 5–15)
BUN: 26 mg/dL — AB (ref 6–20)
CALCIUM: 8.4 mg/dL — AB (ref 8.9–10.3)
CO2: 25 mmol/L (ref 22–32)
CREATININE: 1.4 mg/dL — AB (ref 0.61–1.24)
Chloride: 104 mmol/L (ref 101–111)
GFR calc Af Amer: 49 mL/min — ABNORMAL LOW (ref >60)
GFR, EST NON AFRICAN AMERICAN: 42 mL/min — AB (ref >60)
GLUCOSE: 113 mg/dL — AB (ref 65–99)
Potassium: 4 mmol/L (ref 3.5–5.1)
Sodium: 136 mmol/L (ref 135–145)

## 2015-08-21 LAB — FOLATE RBC
FOLATE, HEMOLYSATE: 548.2 ng/mL
Folate, RBC: UNDETERMINED ng/mL
Hematocrit: UNDETERMINED %

## 2015-08-21 LAB — HEMATOLOGY COMMENTS:: Hematology Comments:: UNDETERMINED

## 2015-08-21 MED ORDER — BISACODYL 10 MG RE SUPP: 10.0000 mg | Freq: Once | RECTAL | Status: DC

## 2015-08-21 MED ORDER — ENSURE ENLIVE PO LIQD: 237.0000 mL | Freq: Two times a day (BID) | ORAL | Status: DC

## 2015-08-21 MED ORDER — PANTOPRAZOLE SODIUM 40 MG PO TBEC
40.0000 mg | DELAYED_RELEASE_TABLET | Freq: Every day | ORAL | Status: DC
Start: 2015-08-21 — End: 2015-08-29

## 2015-08-21 MED ORDER — POLYETHYLENE GLYCOL 3350 17 G PO PACK: 17.0000 g | PACK | Freq: Two times a day (BID) | ORAL | Status: DC

## 2015-08-21 NOTE — Clinical Social Work Note (Signed)
Clinical Social Work Assessment  Patient Details  Name: DENHAM WILDY MRN: [id number] Date of Birth: 07-23-1923  Date of referral:  08/21/15               Reason for consult:                   Permission sought to share information with:  Facility Sport and exercise psychologist, Family Supports Permission granted to share information::     Name::        Agency::     Relationship::     Contact Information:     Housing/Transportation Living arrangements for the past 2 months:  [place] of Information:  Adult Children Patient Interpreter Needed:  None Criminal Activity/Legal Involvement Pertinent to Current Situation/Hospitalization:  No - Comment as needed Significant Relationships:  Adult Children, Spouse Lives with:  Facility Resident Do you feel safe going back to the place where you live?  No Need for family participation in patient care:  Yes (Comment)  Care giving concerns:  CSW spoke with pt's dt., Ann [phone] as pt is disorientated x 3. Prior to this hospitalization pt was living at [place], [place]. Pt's daughter expressed concern about this facility and states he will not be returning. Pt's wife is staying at [facility] and would prefer for pt to be placed there.    Social Worker assessment / plan:  CSW will speak with intake staff at [facility] to determine if they can accommodate pt; [place] SNF search will be initiated as a backup. Pt's family is agreeable with SNF placement. Pt's daughter is hopeful that pt can be placed at [facility] since pt's wife/her mother is also there. Pt's daughter states her parents health have declined significantly since October and it would be easier to mange if both the pt and his wife were at the same facility.   Employment status:  Retired Nurse, adult PT Recommendations:  [place] / Referral to community resources:  [place]  Patient/Family's Response to care:  CSW explained the SNF referral process; pt's dt expressed gratitude regarding potential [facility] placement.   Patient/Family's Understanding of and Emotional Response to Diagnosis, Current Treatment, and Prognosis:  Family is very realistic regarding pt's needs and has plans in place to mange future needs. Pt's daughter is familiar with [facility] and has already worked with a Education officer, museum there. The pt remains confused, yet pleasant. He doesn't appear to have an understanding of his current needs.   Emotional Assessment Appearance:  Appears stated age Attitude/Demeanor/Rapport:   (Confused) Affect (typically observed):  Appropriate Orientation:  Oriented to Self Alcohol / Substance use:  Not Applicable Psych involvement (Current and /or in the community):  No (Comment)  Discharge Needs  Concerns to be addressed:  No discharge needs identified Readmission within the last 30 days:  No Current discharge risk:  None Barriers to Discharge:  No Barriers Identified   Linna Darner, LCSW 08/21/2015, 12:44 PM

## 2015-08-21 NOTE — Progress Notes (Signed)
Daily Progress Note   Patient Name: [name]       Date: 08/21/2015 DOB: 05/02/1923  Age: 82 y.o. MRN#: [id number] Attending Physician: Theodis Blaze, MD Primary Care Physician: Odette Fraction, MD Admit Date: 08/14/2015  Reason for Consultation/Follow-up: Non pain symptom management and Pain control  Subjective: Much more alert today.  Tells me his mouth is dry and coated.  Reports some stomach pain.  Asks for his daughter, [name].  Interval Events:  SBO symptoms resolved.  Patient to be D/C'd with SNF bed available.  Daughter concerned about oral intake.  Discussed with daughter the option of having palliative services follow at SNF and then consideration for Hospice Services in her home when he finishes rehab.  Length of Stay: 7 days  Current Medications: Scheduled Meds:  . chlorhexidine gluconate  15 mL Mouth/Throat QID  . digoxin  0.125 mg Intravenous Daily  . famotidine (PEPCID) IV  20 mg Intravenous Q12H  . metoprolol  5 mg Intravenous 4 times per day  . pantoprazole (PROTONIX) IV  40 mg Intravenous Q12H  . polyethylene glycol  17 g Oral BID  . tamsulosin  0.4 mg Oral QHS    Continuous Infusions: . dextrose 5 % with KCl 20 mEq / L 20 mEq (08/20/15 2200)    PRN Meds: acetaminophen **OR** acetaminophen, HYDROmorphone, labetalol, loteprednol, ondansetron **OR** ondansetron (ZOFRAN) IV  Physical Exam: Physical Exam      Awake, Alert, Wd,sitting up wanting to eat. CV RRR no frank m/r/g Resp:  No increased work of breathing Abd:  Firm, mildly tender to palpation          Vital Signs: BP 127/62 mmHg  Pulse 76  Temp(Src) 97.8 F (36.6 C) (Axillary)  Resp 18  Ht 5\' 9"  (1.753 m)  Wt 62.143 kg (137 lb)  BMI 20.22 kg/m2  SpO2 97% SpO2: SpO2: 97 % O2 Device: O2  Device: Not Delivered O2 Flow Rate:    Intake/output summary:  Intake/Output Summary (Last 24 hours) at 08/21/15 1010 Last data filed at 08/21/15 0600  Gross per 24 hour  Intake   2280 ml  Output    950 ml  Net   1330 ml   LBM: Last BM Date: 08/19/15 Baseline Weight: Weight: 62.143 kg (137 lb) Most recent weight: Weight: 62.143 kg (137  lb)       Palliative Assessment/Data: Flowsheet Rows        Most Recent Value   Intake Tab    Referral Department  Hospitalist   Unit at Time of Referral  Med/Surg Unit   Palliative Care Primary Diagnosis  Other (Comment) [SBO]   Date Notified  08/19/15   Palliative Care Type  New Palliative care   Reason for referral  Clarify Goals of Care   Date of Admission  08/14/15   Date first seen by Palliative Care  08/20/15   # of days Palliative referral response time  1 Day(s)   # of days IP prior to Palliative referral  5   Clinical Assessment    Psychosocial & Spiritual Assessment    Palliative Care Outcomes       Additional Data Reviewed: CBC    Component Value Date/Time   WBC 10.0 08/21/2015 0515   RBC 3.81* 08/21/2015 0515   HGB 12.4* 08/21/2015 0515   HCT 38.3* 08/21/2015 0515   PLT 147* 08/21/2015 0515   MCV 100.5* 08/21/2015 0515   MCH 32.5 08/21/2015 0515   MCHC 32.4 08/21/2015 0515   RDW 13.2 08/21/2015 0515   LYMPHSABS 0.2* 08/15/2015 0540   MONOABS 0.6 08/15/2015 0540   EOSABS 0.0 08/15/2015 0540   BASOSABS 0.0 08/15/2015 0540    CMP     Component Value Date/Time   NA 136 08/21/2015 0515   K 4.0 08/21/2015 0515   CL 104 08/21/2015 0515   CO2 25 08/21/2015 0515   GLUCOSE 113* 08/21/2015 0515   BUN 26* 08/21/2015 0515   CREATININE 1.40* 08/21/2015 0515   CREATININE 1.34 04/16/2014 1544   CALCIUM 8.4* 08/21/2015 0515   PROT 6.5 08/15/2015 0540   ALBUMIN 3.9 08/15/2015 0540   AST 22 08/15/2015 0540   ALT 12* 08/15/2015 0540   ALKPHOS 46 08/15/2015 0540   BILITOT 0.8 08/15/2015 0540   GFRNONAA 42* 08/21/2015  0515   GFRNONAA 47* 04/16/2014 1544   GFRAA 49* 08/21/2015 0515   GFRAA 54* 04/16/2014 1544       Problem List:  Patient Active Problem List   Diagnosis Date Noted  . Palliative care encounter   . DNR (do not resuscitate)   . Loss of weight   . Small bowel obstruction due to adhesions ([place]) 08/15/2015  . SBO (small bowel obstruction) ([place]) 08/14/2015  . BPH (benign prostatic hyperplasia)   . Partial small bowel obstruction ([place]) 08/28/2012  . AKI (acute kidney injury) ([place]) 08/28/2012  . Abdominal pain, acute 08/28/2012  . Nausea and vomiting 08/28/2012  . Dehydration 08/28/2012  . Pacemaker-Medtronic 12/22/2010  . HYPERLIPIDEMIA 08/28/2010  . Atrial fibrillation ([place]) 08/28/2010  . BRADYCARDIA-TACHYCARDIA SYNDROME 08/28/2010     Palliative Care Assessment & Plan    1.Code Status:  DNR    Code Status Orders        Start     Ordered   08/15/15 0134  Do not attempt resuscitation (DNR)   Continuous    Question Answer Comment  In the event of cardiac or respiratory ARREST Do not call a "code blue"   In the event of cardiac or respiratory ARREST Do not perform Intubation, CPR, defibrillation or ACLS   In the event of cardiac or respiratory ARREST Use medication by any route, position, wound care, and other measures to relive pain and suffering. May use oxygen, suction and manual treatment of airway obstruction as needed for comfort.  08/15/15 0138    Code Status History    Date Active Date Inactive Code Status Order ID Comments User Context   08/07/2013 12:56 PM 08/07/2013  5:19 PM Full Code [id number]  [name], MD Inpatient   08/29/2012 12:00 AM 09/02/2012  4:22 PM Full Code [id number]  Kaylyn Lim, RN Inpatient    Advance Directive Documentation        Most Recent Value   Type of Advance Directive  Out of facility DNR (pink MOST or yellow form)   Pre-existing out of facility DNR order (yellow form or pink MOST form)  Yellow form placed in chart (order not  valid for inpatient use)   "MOST" Form in Place?         2. Goals of Care/Additional Recommendations:  DNR.     Reommend Palliative Services be Engaged at [facility] - please include in D/C summary.  Continue regular bowel regimen - monitor for stools.  Patient at high risk for recurrent SBO  Ensure supplementation and soft diet  Psycho-social Needs: Caregiving  Support/Resources   Family (daugther [name]) would benefit from information on [employer].  Patient responds poorly to narcotic medications (Altered mental status)   4. Palliative Prophylaxis:   Aspiration and Bowel Regimen  5. Prognosis: Unable to determine.   At high risk for recurrent SBO.  Likely less than 6 months if he is unable to support himself nutritionally.  6. Discharge Planning:  [place] for rehab with Palliative care service follow-up   Care plan was discussed with daughter [name].  Thank you for allowing the Palliative Medicine Team to assist in the care of this patient.   Time In: 9:50 Time Out: 10:25 Total Time 35 min Prolonged Time Billed  no        Imogene Burn, [place] [facility] Pager: [phone]  08/21/2015, 10:10 AM  Please contact Palliative Medicine Team phone at [phone] for questions and concerns.

## 2015-08-21 NOTE — Discharge Instructions (Signed)

## 2015-08-21 NOTE — Care Management Note (Signed)
Case Management Note  Patient Details  Name: LOVELLE WESTLING MRN: [id number] Date of Birth: 02-18-1923  Subjective/Objective:        Admitted with bowel obstruction            Action/Plan: Discharge planning per CSW  Expected Discharge Date:                  Expected Discharge Plan:  Skilled Nursing Facility  In-House Referral:  Clinical Social Work  Discharge planning Services  CM Consult  Post Acute Care Choice:  NA Choice offered to:  NA  DME Arranged:  N/A DME Agency:  NA  HH Arranged:  NA HH Agency:  NA  Status of Service:  Completed, signed off  Medicare Important Message Given:  Yes Date Medicare IM Given:    Medicare IM give by:    Date Additional Medicare IM Given:    Additional Medicare Important Message give by:     If discussed at [place] of Stay Meetings, dates discussed:    Additional Comments:  Guadalupe Maple, RN 08/21/2015, 11:59 AM [phone]

## 2015-08-21 NOTE — Clinical Social Work Note (Signed)
CSW confirmed with [facility] SNF that pt will be accepted today. CSW will facilitate discharge.    Cindra Presume, LCSW [phone] Hospital psychiatric & 5E, 5W [phone] Licensed Clinical Social Worker

## 2015-08-21 NOTE — Discharge Summary (Signed)
Physician Discharge Summary  [name] [id number] DOB: Jul 30, 1923 DOA: 08/14/2015  PCP: Odette Fraction, MD  Admit date: 08/14/2015 Discharge date: 08/21/2015  Recommendations for Outpatient Follow-up:  1. Pt will need to follow up with PCP in 1 -2 weeks post discharge if needed  2. Please obtain BMP to evaluate electrolytes and kidney function  Discharge Diagnoses:  Active Problems:   SBO (small bowel obstruction) (HCC)   Small bowel obstruction due to adhesions [facility])   Palliative care encounter   DNR (do not resuscitate)   Loss of weight  Discharge Condition: Stable  Diet recommendation: comfort feeding as tolerated    Brief narrative:    82 y.o. gentleman with a history of chronic atrial fibrillation (no anticoagulation due to history of falls, bleeding risk), glaucoma, and age-related cognitive impairment (his daughter denies a formal diagnosis of dementia) who is a resident of an assisted living facility.He was in his baseline state of health until one day 82 when he developed nausea and vomiting.   ED evaluation in [facility] concerning for mild AKI and CT findings consistent with small bowel obstruction. Hospitalist asked to admit; general surgery to consult.  Assessment/Plan:    1. SBO - due to adhesions, improving based on KuB - ngt removed on 1/17 per [facility], started clears but pt has been refusing - can allow comfort feeding if pt wants  - obstructive symptoms resolved, PCT consulted, pt now DNR - plan to d/c SNF  2. Constipation - allow enema if needed   3. Permanent Afib - rate controlled, continue IV BB and Digoxin - not on anticoagulation, s/p PPM for tachy-brady syndrome  4. Cognitive dysfunction/Senile Dementia - with confusion, better this AM  5. Anemia of chronic disease, IDS/thrombocytopenia - dilution to some extent, no AC due to high risk of bleed and dementia   6. Hypokalemia - supplemented  7. Hyperchloremic  hypernatremia - resolved   DVT prophylaxis - SCD's  Code Status: DNR Family Communication: No family at bedside  Disposition Plan: SNF 11/19   IV access:  Peripheral IV  Procedures and diagnostic studies:    Imaging Results    Dg Shoulder Right  07/22/2015 CLINICAL DATA: Status post fall today onto a carpeted floor. Initial encounter. EXAM: RIGHT SHOULDER - 2+ VIEW COMPARISON: None. FINDINGS: No acute bony or joint abnormality is identified. Bulky acromioclavicular degenerative change is seen. Imaged right lung and ribs are unremarkable. Paced device is noted. IMPRESSION: No acute abnormality. Bulky acromioclavicular osteoarthritis. Electronically Signed By: Inge Rise M.D. On: 07/22/2015 12:20   Dg Elbow Complete Right  07/22/2015 CLINICAL DATA: Post fall this morning with right elbow pain. History of dementia. EXAM: RIGHT ELBOW - COMPLETE 3+ VIEW COMPARISON: None. FINDINGS: No displaced fracture or elbow joint effusion. Peripherally corticated ossicles are noted about both the medial and lateral epicondyles. Regional soft tissues appear normal. No radiopaque foreign body. IMPRESSION: No acute findings. Specifically, no displaced fracture or elbow joint effusion Electronically Signed By: Sandi Mariscal M.D. On: 07/22/2015 13:49   Dg Abd 1 View  08/17/2015 CLINICAL DATA: Abdominal pain and distention EXAM: ABDOMEN - 1 VIEW COMPARISON: 08/16/2015 FINDINGS: The LEFT lateral quadrant of the abdomen excluded. NG tube in stomach. No dilated loops of large or small bowel. Gas and the rectum. Moderate volume stool throughout the colon on the RIGHT colon. IMPRESSION: 1. No evidence of bowel obstruction. 2. Moderate to large volume stool. Electronically Signed By: Suzy Bouchard M.D. On: 08/17/2015 10:33   Ct Head Wo Contrast  07/22/2015 CLINICAL DATA: Fall over walker onto carpeted floor, now with abrasion involving the right eye. EXAM: CT HEAD WITHOUT CONTRAST  TECHNIQUE: Contiguous axial images were obtained from the base of the skull through the vertex without intravenous contrast. COMPARISON: 01/23/2015 ; 12/15/2014 FINDINGS: Similar findings of advanced atrophy with sulcal prominence and centralized volume loss with commensurate ex vacuo dilatation of the ventricular system. Extensive, nearly confluent, periventricular hypodensities compatible microvascular ischemic disease, grossly unchanged. Given extensive background parenchymal abnormalities, there is no CT evidence superimposed acute large territory infarct. No intraparenchymal extra-axial mass or hemorrhage. Unchanged size and configuration of ventricles and basilar cisterns. No midline shift. Intracranial atherosclerosis. Limited visualization of the paranasal sinuses and mastoid air cells is normal. Regional soft tissues appear normal with special attention paid to the right orbit. No radiopaque foreign body. Post bilateral cataract surgery. No displaced calvarial fracture. IMPRESSION: Similar findings of advanced atrophy and microvascular ischemic disease without acute intracranial process. Electronically Signed By: Sandi Mariscal M.D. On: 07/22/2015 12:10   Ct Abdomen Pelvis W Contrast  08/14/2015 CLINICAL DATA: 82 year old male with generalized abdominal pain for 3 days. Nausea and vomiting. EXAM: CT ABDOMEN AND PELVIS WITH CONTRAST TECHNIQUE: Multidetector CT imaging of the abdomen and pelvis was performed using the standard protocol following bolus administration of intravenous contrast. CONTRAST: 20mL OMNIPAQUE IOHEXOL 300 MG/ML SOLN COMPARISON: CT 08/28/2012 FINDINGS: Lower chest: The included lung bases are clear. Pacemaker wires partially included. Heart upper limits normal in size. Liver: Unchanged tiny hypodensity in the right lobe, incompletely characterize but likely cyst. Suspicious lesion. Hepatobiliary: Gallbladder physiologically distended. Calcified gallstones are seen. No biliary  dilatation. Pancreas: Atrophic without ductal dilatation or surrounding inflammation. Spleen: Normal. Adrenal glands: No nodule. Kidneys: Symmetric renal enhancement. No hydronephrosis. There is a 1.9 cm simple cyst in the posterior interpolar right kidney. Trace of perinephric fluid is unchanged. Stomach/Bowel: Stomach markedly distended with fluid. Fluid in the distal esophagus. Small bowel loops are diffusely dilated and fluid-filled. Transition point in the right mid abdomen, best appreciated on coronal image 27. Small bowel loops distal to this are decompressed. There is adjacent mesenteric edema and small amount of free fluid. No pneumatosis. No free air. Small bowel loops in the right lower quadrant extend towards the right inguinal canal, unchanged in appearance from prior exam. Moderate stool burden throughout the colon. Appendix tentatively identified and normal. Vascular/Lymphatic: No retroperitoneal adenopathy. Abdominal aorta is normal in caliber. Moderate atherosclerosis without aneurysm. Reproductive: Prostate gland prominent size. Bladder: Distended without wall thickening. Other: Small amount of free fluid in the pelvis is likely reactive. There is no free air. Probable previous right inguinal hernia repair. Musculoskeletal: Multilevel degenerative change throughout the lumbar spine There are no acute or suspicious osseous abnormalities. IMPRESSION: Small-bowel obstruction with transition point in the right mid abdomen. This is similar in location to prior exam, likely secondary to adhesions. Mesenteric edema and small amount of free fluid, no pneumatosis or perforation. Electronically Signed By: Jeb Levering M.D. On: 08/14/2015 20:40   Dg Knee Complete 4 Views Right  07/22/2015 CLINICAL DATA: Status post fall today. Right knee pain. Initial encounter. EXAM: RIGHT KNEE - COMPLETE 4+ VIEW COMPARISON: None. FINDINGS: There is no acute bony or joint abnormality. No joint effusion is  seen. No notable degenerative disease is identified. Chondrocalcinosis is noted. IMPRESSION: No acute abnormality. Electronically Signed By: Inge Rise M.D. On: 07/22/2015 12:31   Dg Abd 2 Views  08/19/2015 CLINICAL DATA: Follow-up bowel obstruction EXAM: ABDOMEN - 2 VIEW COMPARISON: August 17 2015 FINDINGS: Extensive bowel content is identified throughout colon. There are a few dilated small bowel loops in the mid abdomen. Scattered air is identified throughout colon. A nasogastric tube is identified distal tip in the stomach. Degenerative joint changes of the spine are identified. IMPRESSION: A few dilated small bowel loops in the mid abdomen with scattered air identified throughout colon. This could be due to resolving or partial small bowel obstruction. Extensive bowel content noted throughout colon. Electronically Signed By: Abelardo Diesel M.D. On: 08/19/2015 10:55   Dg Abd 2 Views  08/16/2015 CLINICAL DATA: Abdominal pain. Follow-up small bowel obstruction. EXAM: ABDOMEN - 2 VIEW COMPARISON: CT, 08/14/2015. FINDINGS: Dilated small bowel is seen in the left upper quadrant. Maximum diameter is measured at 4.7 cm, which is magnified by the AP supine technique. There is stool seen within colon. There is no colonic dilation. No free air is noted on the decubitus view. There are few scattered air-fluid levels. Nasogastric tube projects in the mid stomach. IMPRESSION: 1. Persistent dilated small bowel left upper quadrant. However, overall, the small bowel obstruction appears improved. There is no free air. Electronically Signed By: Lajean Manes M.D. On: 08/16/2015 10:39   Dg Hand Complete Right  07/22/2015 CLINICAL DATA: Status post fall today with an abrasion on the posterior aspect of the right hand. Initial encounter. EXAM: RIGHT HAND - COMPLETE 3+ VIEW COMPARISON: None. FINDINGS: No acute bony or joint abnormality is identified. Postoperative change is seen about the dorsal  margin of the PIP joint of the right long fingers. Scattered mild degenerative change is most notable about the DIP joint of the index and long fingers. Chondrocalcinosis is identified. Soft tissues are unremarkable. IMPRESSION: No acute abnormality. Electronically Signed By: Inge Rise M.D. On: 07/22/2015 12:19     Medical Consultants:  Surgery PCT  Other Consultants:  PT  IAnti-Infectives:   None      Discharge Exam: Filed Vitals:   08/21/15 0053 08/21/15 0535  BP: 122/60 127/62  Pulse: 75 76  Temp:  97.8 F (36.6 C)  Resp:  18   Filed Vitals:   08/20/15 1812 08/20/15 2120 08/21/15 0053 08/21/15 0535  BP: 126/55 111/58 122/60 127/62  Pulse: 69 86 75 76  Temp:  97.7 F (36.5 C)  97.8 F (36.6 C)  TempSrc:  Axillary  Axillary  Resp:  18  18  Height:      Weight:      SpO2:  97%  97%    General: Pt is alert, follows commands appropriately, not in acute distress Cardiovascular: Regular rate and rhythm, no rubs, no gallops Respiratory: Clear to auscultation bilaterally, diminished breath sounds at bases  Abdominal: Soft, non tender, non distended, bowel sounds +, no guarding  Discharge Instructions  Discharge Instructions    Diet - low sodium heart healthy    Complete by:  As directed      Increase activity slowly    Complete by:  As directed             Medication List    STOP taking these medications        simvastatin 40 MG tablet  Commonly known as:  ZOCOR      TAKE these medications        acetaminophen 325 MG tablet  Commonly known as:  TYLENOL  Take 650 mg by mouth See admin instructions. Take 2 tablets (650 mg) 2 times daily, may take 2 more tablets mid-day as needed for pain  aspirin EC 81 MG tablet  Take 81 mg by mouth daily.     digoxin 0.125 MG tablet  Commonly known as:  LANOXIN  Take 1 tablet (125 mcg total) by mouth daily.     FLOMAX 0.4 MG Caps capsule  Generic drug:  tamsulosin  Take 0.4 mg by mouth at  bedtime.     loteprednol 0.5 % ophthalmic suspension  Commonly known as:  LOTEMAX  Place 1 drop into both eyes daily as needed (itching eyes).     metoprolol succinate 25 MG 24 hr tablet  Commonly known as:  TOPROL-XL  TAKE 1 TABLET (25 MG TOTAL) BY MOUTH DAILY. TAKE WITH OR IMMEDIATELY FOLLOWING A MEAL.     pantoprazole 40 MG tablet  Commonly known as:  PROTONIX  Take 1 tablet (40 mg total) by mouth daily.     polyethylene glycol packet  Commonly known as:  MIRALAX / GLYCOLAX  Take 17 g by mouth 2 (two) times daily.            Follow-up Information    Follow up with Odette Fraction, MD.   Specialty:  Family Medicine   Contact information:   [address] [place] [address] [phone]        The results of significant diagnostics from this hospitalization (including imaging, microbiology, ancillary and laboratory) are listed below for reference.     Microbiology: No results found for this or any previous visit (from the past 240 hour(s)).   Labs: Basic Metabolic Panel:  Recent Labs Lab 08/17/15 0836 08/18/15 0415 08/19/15 0355 08/20/15 0510 08/21/15 0515  NA 145 149* 145 139 136  K 3.4* 3.8 3.4* 3.9 4.0  CL 107 113* 107 104 104  CO2 27 27 29 26 25   GLUCOSE 83 101* 117* 110* 113*  BUN 34* 34* 25* 23* 26*  CREATININE 1.02 1.10 1.03 1.13 1.40*  CALCIUM 8.6* 8.7* 8.5* 8.2* 8.4*  MG  --  2.2  --  1.8  --    Liver Function Tests:  Recent Labs Lab 08/14/15 1850 08/15/15 0540  AST 30 22  ALT 16* 12*  ALKPHOS 48 46  BILITOT 0.8 0.8  PROT 7.3 6.5  ALBUMIN 4.1 3.9    Recent Labs Lab 08/14/15 1850  LIPASE 21   CBC:  Recent Labs Lab 08/14/15 1850 08/15/15 0540 08/16/15 0621 08/17/15 0836 08/19/15 0355 08/20/15 0510 08/21/15 0515  WBC 6.3 4.2 4.5 6.2 7.5 10.1 10.0  NEUTROABS 5.6 3.4  --   --   --   --   --   HGB 13.1 11.8* 11.1* 12.2* 13.4 13.1 12.4*  HCT 41.4 37.1* 35.7* 39.0 42.3 41.4 38.3*  MCV 101.7* 103.9* 104.4*  103.4* 101.9* 101.2* 100.5*  PLT 150 132* 100* 109* 122* 135* 147*     SIGNED: Time coordinating discharge: 30 minutes  MAGICK-Shyniece Scripter, MD  [facility] 08/21/2015, 9:40 AM Pager [phone]  If 7PM-7AM, please contact night-coverage [email or web address] Password [email or web address]

## 2015-08-21 NOTE — Progress Notes (Signed)
Pt DC back to a SNF (new facility) transport by [facility]. Pt is alert to self only, VSS, skin tears to BUE, but no breakdown, he is DC with foley due high creatinine levels per MD, all belongings that are in room have been packed and sent with [facility] staff.

## 2015-08-23 ENCOUNTER — Inpatient Hospital Stay (HOSPITAL_COMMUNITY): Payer: Medicare Other

## 2015-08-23 ENCOUNTER — Encounter (HOSPITAL_COMMUNITY): Payer: Self-pay | Admitting: Emergency Medicine

## 2015-08-23 ENCOUNTER — Inpatient Hospital Stay (HOSPITAL_COMMUNITY)
Admission: EM | Admit: 2015-08-23 | Discharge: 2015-08-29 | DRG: 871 | Disposition: A | Discharge status: Hospice | Payer: Medicare Other | Attending: Internal Medicine | Admitting: Internal Medicine

## 2015-08-23 ENCOUNTER — Emergency Department (HOSPITAL_COMMUNITY): Payer: Medicare Other

## 2015-08-23 DIAGNOSIS — E7800 Pure hypercholesterolemia, unspecified: Secondary | ICD-10-CM | POA: Diagnosis present

## 2015-08-23 DIAGNOSIS — E870 Hyperosmolality and hypernatremia: Secondary | ICD-10-CM | POA: Diagnosis present

## 2015-08-23 DIAGNOSIS — Z885 Allergy status to narcotic agent status: Secondary | ICD-10-CM

## 2015-08-23 DIAGNOSIS — I482 Chronic atrial fibrillation, unspecified: Secondary | ICD-10-CM

## 2015-08-23 DIAGNOSIS — K56609 Unspecified intestinal obstruction, unspecified as to partial versus complete obstruction: Secondary | ICD-10-CM

## 2015-08-23 DIAGNOSIS — Z79899 Other long term (current) drug therapy: Secondary | ICD-10-CM | POA: Diagnosis not present

## 2015-08-23 DIAGNOSIS — D696 Thrombocytopenia, unspecified: Secondary | ICD-10-CM | POA: Diagnosis present

## 2015-08-23 DIAGNOSIS — E43 Unspecified severe protein-calorie malnutrition: Secondary | ICD-10-CM | POA: Diagnosis present

## 2015-08-23 DIAGNOSIS — E860 Dehydration: Secondary | ICD-10-CM | POA: Diagnosis present

## 2015-08-23 DIAGNOSIS — E785 Hyperlipidemia, unspecified: Secondary | ICD-10-CM | POA: Diagnosis present

## 2015-08-23 DIAGNOSIS — A4151 Sepsis due to Escherichia coli [E. coli]: Principal | ICD-10-CM | POA: Diagnosis present

## 2015-08-23 DIAGNOSIS — R6521 Severe sepsis with septic shock: Secondary | ICD-10-CM | POA: Diagnosis present

## 2015-08-23 DIAGNOSIS — E872 Acidosis: Secondary | ICD-10-CM | POA: Diagnosis present

## 2015-08-23 DIAGNOSIS — N390 Urinary tract infection, site not specified: Secondary | ICD-10-CM | POA: Diagnosis present

## 2015-08-23 DIAGNOSIS — H409 Unspecified glaucoma: Secondary | ICD-10-CM | POA: Diagnosis present

## 2015-08-23 DIAGNOSIS — K565 Intestinal adhesions [bands] with obstruction (postprocedural) (postinfection): Secondary | ICD-10-CM | POA: Diagnosis present

## 2015-08-23 DIAGNOSIS — Z66 Do not resuscitate: Secondary | ICD-10-CM | POA: Diagnosis present

## 2015-08-23 DIAGNOSIS — D649 Anemia, unspecified: Secondary | ICD-10-CM | POA: Diagnosis present

## 2015-08-23 DIAGNOSIS — Z515 Encounter for palliative care: Secondary | ICD-10-CM

## 2015-08-23 DIAGNOSIS — F0390 Unspecified dementia without behavioral disturbance: Secondary | ICD-10-CM | POA: Diagnosis present

## 2015-08-23 DIAGNOSIS — I4891 Unspecified atrial fibrillation: Secondary | ICD-10-CM | POA: Diagnosis present

## 2015-08-23 DIAGNOSIS — R532 Functional quadriplegia: Secondary | ICD-10-CM | POA: Diagnosis present

## 2015-08-23 DIAGNOSIS — J9601 Acute respiratory failure with hypoxia: Secondary | ICD-10-CM | POA: Diagnosis present

## 2015-08-23 DIAGNOSIS — Z886 Allergy status to analgesic agent status: Secondary | ICD-10-CM | POA: Diagnosis not present

## 2015-08-23 DIAGNOSIS — Z7982 Long term (current) use of aspirin: Secondary | ICD-10-CM

## 2015-08-23 DIAGNOSIS — Z681 Body mass index (BMI) 19 or less, adult: Secondary | ICD-10-CM

## 2015-08-23 DIAGNOSIS — R7989 Other specified abnormal findings of blood chemistry: Secondary | ICD-10-CM

## 2015-08-23 DIAGNOSIS — Z950 Presence of cardiac pacemaker: Secondary | ICD-10-CM

## 2015-08-23 DIAGNOSIS — Z8249 Family history of ischemic heart disease and other diseases of the circulatory system: Secondary | ICD-10-CM

## 2015-08-23 DIAGNOSIS — I248 Other forms of acute ischemic heart disease: Secondary | ICD-10-CM | POA: Diagnosis present

## 2015-08-23 DIAGNOSIS — R296 Repeated falls: Secondary | ICD-10-CM | POA: Diagnosis present

## 2015-08-23 DIAGNOSIS — N179 Acute kidney failure, unspecified: Secondary | ICD-10-CM | POA: Diagnosis present

## 2015-08-23 DIAGNOSIS — I495 Sick sinus syndrome: Secondary | ICD-10-CM | POA: Diagnosis present

## 2015-08-23 DIAGNOSIS — A419 Sepsis, unspecified organism: Secondary | ICD-10-CM | POA: Diagnosis present

## 2015-08-23 DIAGNOSIS — K5669 Other intestinal obstruction: Secondary | ICD-10-CM | POA: Diagnosis not present

## 2015-08-23 DIAGNOSIS — Z7401 Bed confinement status: Secondary | ICD-10-CM | POA: Diagnosis not present

## 2015-08-23 DIAGNOSIS — K562 Volvulus: Secondary | ICD-10-CM | POA: Diagnosis present

## 2015-08-23 DIAGNOSIS — R109 Unspecified abdominal pain: Secondary | ICD-10-CM | POA: Diagnosis present

## 2015-08-23 DIAGNOSIS — N400 Enlarged prostate without lower urinary tract symptoms: Secondary | ICD-10-CM | POA: Diagnosis present

## 2015-08-23 DIAGNOSIS — R778 Other specified abnormalities of plasma proteins: Secondary | ICD-10-CM

## 2015-08-23 DIAGNOSIS — Z0189 Encounter for other specified special examinations: Secondary | ICD-10-CM

## 2015-08-23 DIAGNOSIS — Z87891 Personal history of nicotine dependence: Secondary | ICD-10-CM

## 2015-08-23 DIAGNOSIS — I214 Non-ST elevation (NSTEMI) myocardial infarction: Secondary | ICD-10-CM | POA: Diagnosis present

## 2015-08-23 DIAGNOSIS — K5900 Constipation, unspecified: Secondary | ICD-10-CM | POA: Diagnosis not present

## 2015-08-23 DIAGNOSIS — R100 Acute abdomen: Secondary | ICD-10-CM | POA: Diagnosis not present

## 2015-08-23 DIAGNOSIS — Z7189 Other specified counseling: Secondary | ICD-10-CM | POA: Diagnosis present

## 2015-08-23 DIAGNOSIS — I480 Paroxysmal atrial fibrillation: Secondary | ICD-10-CM | POA: Diagnosis not present

## 2015-08-23 LAB — URINALYSIS, ROUTINE W REFLEX MICROSCOPIC
Glucose, UA: NEGATIVE mg/dL
Ketones, ur: 15 mg/dL — AB
Nitrite: POSITIVE — AB
PROTEIN: 30 mg/dL — AB
Specific Gravity, Urine: 1.022 (ref 1.005–1.030)
pH: 5.5 (ref 5.0–8.0)

## 2015-08-23 LAB — COMPREHENSIVE METABOLIC PANEL
ALBUMIN: 3.1 g/dL — AB (ref 3.5–5.0)
ALBUMIN: 2 g/dL — AB (ref 3.5–5.0)
ALT: 18 U/L (ref 17–63)
ALT: 15 U/L — AB (ref 17–63)
ALT: 18 U/L (ref 17–63)
AST: 31 U/L (ref 15–41)
AST: 32 U/L (ref 15–41)
AST: 40 U/L (ref 15–41)
Albumin: 2.5 g/dL — ABNORMAL LOW (ref 3.5–5.0)
Alkaline Phosphatase: 50 U/L (ref 38–126)
Alkaline Phosphatase: 350 U/L — ABNORMAL HIGH (ref 38–126)
Alkaline Phosphatase: 101 U/L (ref 38–126)
Anion gap: 11 (ref 5–15)
Anion gap: 13 (ref 5–15)
BUN: 37 mg/dL — AB (ref 6–20)
BUN: 32 mg/dL — AB (ref 6–20)
BUN: 40 mg/dL — AB (ref 6–20)
CALCIUM: 5.8 mg/dL — AB (ref 8.9–10.3)
CHLORIDE: 104 mmol/L (ref 101–111)
CHLORIDE: 103 mmol/L (ref 101–111)
CHLORIDE: 108 mmol/L (ref 101–111)
CO2: 26 mmol/L (ref 22–32)
CO2: 19 mmol/L — AB (ref 22–32)
CO2: 21 mmol/L — ABNORMAL LOW (ref 22–32)
CREATININE: 1.49 mg/dL — AB (ref 0.61–1.24)
CREATININE: 1.37 mg/dL — AB (ref 0.61–1.24)
Calcium: 8.5 mg/dL — ABNORMAL LOW (ref 8.9–10.3)
Calcium: 7.5 mg/dL — ABNORMAL LOW (ref 8.9–10.3)
Creatinine, Ser: 1.94 mg/dL — ABNORMAL HIGH (ref 0.61–1.24)
GFR calc Af Amer: 45 mL/min — ABNORMAL LOW (ref >60)
GFR calc Af Amer: 50 mL/min — ABNORMAL LOW (ref >60)
GFR calc non Af Amer: 43 mL/min — ABNORMAL LOW (ref >60)
GFR, EST AFRICAN AMERICAN: 33 mL/min — AB (ref >60)
GFR, EST NON AFRICAN AMERICAN: 39 mL/min — AB (ref >60)
GFR, EST NON AFRICAN AMERICAN: 29 mL/min — AB (ref >60)
GLUCOSE: 76 mg/dL (ref 65–99)
GLUCOSE: 101 mg/dL — AB (ref 65–99)
Glucose, Bld: 68 mg/dL (ref 65–99)
POTASSIUM: 4.1 mmol/L (ref 3.5–5.1)
POTASSIUM: 4 mmol/L (ref 3.5–5.1)
Potassium: 3.5 mmol/L (ref 3.5–5.1)
SODIUM: 148 mmol/L — AB (ref 135–145)
SODIUM: 142 mmol/L (ref 135–145)
Sodium: 141 mmol/L (ref 135–145)
Total Bilirubin: 1.5 mg/dL — ABNORMAL HIGH (ref 0.3–1.2)
Total Bilirubin: 1.7 mg/dL — ABNORMAL HIGH (ref 0.3–1.2)
Total Bilirubin: 1.9 mg/dL — ABNORMAL HIGH (ref 0.3–1.2)
Total Protein: 5.8 g/dL — ABNORMAL LOW (ref 6.5–8.1)
Total Protein: 4.7 g/dL — ABNORMAL LOW (ref 6.5–8.1)
Total Protein: 4.9 g/dL — ABNORMAL LOW (ref 6.5–8.1)

## 2015-08-23 LAB — CBC WITH DIFFERENTIAL/PLATELET
Basophils Absolute: 0 10*3/uL (ref 0.0–0.1)
Basophils Absolute: 0 10*3/uL (ref 0.0–0.1)
Basophils Relative: 0 %
Basophils Relative: 0 %
EOS ABS: 0 10*3/uL (ref 0.0–0.7)
EOS PCT: 0 %
Eosinophils Absolute: 0 10*3/uL (ref 0.0–0.7)
Eosinophils Relative: 0 %
HCT: 36.7 % — ABNORMAL LOW (ref 39.0–52.0)
HCT: 30 % — ABNORMAL LOW (ref 39.0–52.0)
Hemoglobin: 12 g/dL — ABNORMAL LOW (ref 13.0–17.0)
Hemoglobin: 9.7 g/dL — ABNORMAL LOW (ref 13.0–17.0)
LYMPHS ABS: 0.4 10*3/uL — AB (ref 0.7–4.0)
LYMPHS ABS: 0.2 10*3/uL — AB (ref 0.7–4.0)
LYMPHS PCT: 2 %
Lymphocytes Relative: 2 %
MCH: 32.6 pg (ref 26.0–34.0)
MCH: 32 pg (ref 26.0–34.0)
MCHC: 32.7 g/dL (ref 30.0–36.0)
MCHC: 32.3 g/dL (ref 30.0–36.0)
MCV: 99.7 fL (ref 78.0–100.0)
MCV: 99 fL (ref 78.0–100.0)
MONO ABS: 0.8 10*3/uL (ref 0.1–1.0)
MONO ABS: 0 10*3/uL — AB (ref 0.1–1.0)
MONOS PCT: 4 %
MONOS PCT: 0 %
Neutro Abs: 18.6 10*3/uL — ABNORMAL HIGH (ref 1.7–7.7)
Neutro Abs: 7.4 10*3/uL (ref 1.7–7.7)
Neutrophils Relative %: 94 %
Neutrophils Relative %: 98 %
PLATELETS: 142 10*3/uL — AB (ref 150–400)
PLATELETS: 99 10*3/uL — AB (ref 150–400)
RBC: 3.68 MIL/uL — ABNORMAL LOW (ref 4.22–5.81)
RBC: 3.03 MIL/uL — AB (ref 4.22–5.81)
RDW: 13.6 % (ref 11.5–15.5)
RDW: 13.8 % (ref 11.5–15.5)
WBC: 19.8 10*3/uL — ABNORMAL HIGH (ref 4.0–10.5)
WBC: 7.6 10*3/uL (ref 4.0–10.5)

## 2015-08-23 LAB — URINE MICROSCOPIC-ADD ON

## 2015-08-23 LAB — LIPASE, BLOOD: LIPASE: 38 U/L (ref 11–51)

## 2015-08-23 LAB — LACTIC ACID, PLASMA
LACTIC ACID, VENOUS: 3.9 mmol/L — AB (ref 0.5–2.0)
Lactic Acid, Venous: 1 mmol/L (ref 0.5–2.0)
Lactic Acid, Venous: 0.9 mmol/L (ref 0.5–2.0)
Lactic Acid, Venous: 2.6 mmol/L (ref 0.5–2.0)

## 2015-08-23 LAB — CBC
HCT: 33 % — ABNORMAL LOW (ref 39.0–52.0)
HEMOGLOBIN: 10.7 g/dL — AB (ref 13.0–17.0)
MCH: 32.3 pg (ref 26.0–34.0)
MCHC: 32.4 g/dL (ref 30.0–36.0)
MCV: 99.7 fL (ref 78.0–100.0)
Platelets: 85 10*3/uL — ABNORMAL LOW (ref 150–400)
RBC: 3.31 MIL/uL — AB (ref 4.22–5.81)
RDW: 13.8 % (ref 11.5–15.5)
WBC: 37 10*3/uL — ABNORMAL HIGH (ref 4.0–10.5)

## 2015-08-23 LAB — I-STAT CG4 LACTIC ACID, ED: Lactic Acid, Venous: 1.04 mmol/L (ref 0.5–2.0)

## 2015-08-23 LAB — MRSA PCR SCREENING: MRSA BY PCR: POSITIVE — AB

## 2015-08-23 LAB — TROPONIN I
TROPONIN I: 0.07 ng/mL — AB (ref <0.031)
TROPONIN I: 0.11 ng/mL — AB (ref <0.031)
TROPONIN I: 0.37 ng/mL — AB (ref <0.031)
Troponin I: 0.05 ng/mL — ABNORMAL HIGH (ref <0.031)
Troponin I: 0.05 ng/mL — ABNORMAL HIGH (ref <0.031)

## 2015-08-23 LAB — CBG MONITORING, ED: Glucose-Capillary: 71 mg/dL (ref 65–99)

## 2015-08-23 LAB — PROTIME-INR
INR: 1.43 (ref 0.00–1.49)
INR: 2.28 — ABNORMAL HIGH (ref 0.00–1.49)
Prothrombin Time: 17.6 seconds — ABNORMAL HIGH (ref 11.6–15.2)
Prothrombin Time: 24.9 seconds — ABNORMAL HIGH (ref 11.6–15.2)

## 2015-08-23 LAB — APTT
APTT: 34 s (ref 24–37)
aPTT: 36 seconds (ref 24–37)

## 2015-08-23 LAB — DIGOXIN LEVEL: Digoxin Level: 0.7 ng/mL — ABNORMAL LOW (ref 0.8–2.0)

## 2015-08-23 LAB — PROCALCITONIN: Procalcitonin: 14.75 ng/mL

## 2015-08-23 MED ORDER — SODIUM CHLORIDE 0.9 % IV BOLUS (SEPSIS)
500.0000 mL | INTRAVENOUS | Status: DC | PRN
  Administered 2015-08-23: 500 mL via INTRAVENOUS
  Filled 2015-08-23: qty 500

## 2015-08-23 MED ORDER — IOHEXOL 300 MG/ML  SOLN
50.0000 mL | Freq: Once | INTRAMUSCULAR | Status: AC | PRN
Start: 2015-08-23 — End: 2015-08-23
  Administered 2015-08-23: 50 mL via ORAL

## 2015-08-23 MED ORDER — SODIUM CHLORIDE 0.9 % IV SOLN
INTRAVENOUS | Status: AC
  Administered 2015-08-23: 100 mL/h via INTRAVENOUS
  Administered 2015-08-24 – 2015-08-25 (×3): via INTRAVENOUS

## 2015-08-23 MED ORDER — ONDANSETRON HCL 4 MG PO TABS: 4.0000 mg | ORAL_TABLET | Freq: Four times a day (QID) | ORAL | Status: DC | PRN

## 2015-08-23 MED ORDER — SODIUM CHLORIDE 0.9 % IV SOLN
1000.0000 mL | Freq: Once | INTRAVENOUS | Status: AC
  Administered 2015-08-23: 1000 mL via INTRAVENOUS

## 2015-08-23 MED ORDER — VANCOMYCIN HCL IN DEXTROSE 750-5 MG/150ML-% IV SOLN
750.0000 mg | INTRAVENOUS | Status: DC
Start: 2015-08-24 — End: 2015-08-24
  Filled 2015-08-23: qty 150

## 2015-08-23 MED ORDER — PIPERACILLIN-TAZOBACTAM 3.375 G IVPB 30 MIN
3.3750 g | Freq: Once | INTRAVENOUS | Status: AC
  Administered 2015-08-23: 3.375 g via INTRAVENOUS
  Filled 2015-08-23: qty 50

## 2015-08-23 MED ORDER — CHLORHEXIDINE GLUCONATE CLOTH 2 % EX PADS
6.0000 | MEDICATED_PAD | Freq: Every day | CUTANEOUS | Status: AC
  Administered 2015-08-24 – 2015-08-28 (×5): 6 via TOPICAL

## 2015-08-23 MED ORDER — DIGOXIN 0.25 MG/ML IJ SOLN
0.1250 mg | Freq: Every day | INTRAMUSCULAR | Status: DC
  Administered 2015-08-23 – 2015-08-27 (×5): 0.125 mg via INTRAVENOUS
  Filled 2015-08-23 (×8): qty 0.5

## 2015-08-23 MED ORDER — ONDANSETRON HCL 4 MG/2ML IJ SOLN
4.0000 mg | Freq: Once | INTRAMUSCULAR | Status: AC
  Administered 2015-08-23: 4 mg via INTRAVENOUS
  Filled 2015-08-23: qty 2

## 2015-08-23 MED ORDER — SODIUM CHLORIDE 0.9 % IV SOLN
INTRAVENOUS | Status: DC
  Administered 2015-08-23: 06:00:00 via INTRAVENOUS

## 2015-08-23 MED ORDER — SODIUM CHLORIDE 0.9 % IJ SOLN
3.0000 mL | Freq: Two times a day (BID) | INTRAMUSCULAR | Status: DC
  Administered 2015-08-23 – 2015-08-27 (×9): 3 mL via INTRAVENOUS

## 2015-08-23 MED ORDER — SODIUM CHLORIDE 0.9 % IV SOLN
1.0000 g | Freq: Once | INTRAVENOUS | Status: AC
  Administered 2015-08-23: 1 g via INTRAVENOUS
  Filled 2015-08-23: qty 10

## 2015-08-23 MED ORDER — PIPERACILLIN-TAZOBACTAM 3.375 G IVPB
3.3750 g | Freq: Three times a day (TID) | INTRAVENOUS | Status: DC
  Administered 2015-08-23 – 2015-08-24 (×3): 3.375 g via INTRAVENOUS
  Filled 2015-08-23 (×3): qty 50

## 2015-08-23 MED ORDER — HEPARIN SODIUM (PORCINE) 5000 UNIT/ML IJ SOLN
5000.0000 [IU] | Freq: Three times a day (TID) | INTRAMUSCULAR | Status: DC
  Administered 2015-08-23 – 2015-08-25 (×7): 5000 [IU] via SUBCUTANEOUS
  Filled 2015-08-23 (×10): qty 1

## 2015-08-23 MED ORDER — VANCOMYCIN HCL IN DEXTROSE 1-5 GM/200ML-% IV SOLN
1000.0000 mg | Freq: Once | INTRAVENOUS | Status: AC
  Administered 2015-08-23: 1000 mg via INTRAVENOUS
  Filled 2015-08-23: qty 200

## 2015-08-23 MED ORDER — LOTEPREDNOL ETABONATE 0.5 % OP SUSP: 1.0000 [drp] | Freq: Every day | OPHTHALMIC | Status: DC | PRN

## 2015-08-23 MED ORDER — VITAMIN K1 10 MG/ML IJ SOLN
5.0000 mg | Freq: Once | INTRAVENOUS | Status: AC
  Administered 2015-08-23: 5 mg via INTRAVENOUS
  Filled 2015-08-23: qty 0.5

## 2015-08-23 MED ORDER — METOPROLOL TARTRATE 1 MG/ML IV SOLN: 5.0000 mg | Freq: Four times a day (QID) | INTRAVENOUS | Status: DC | PRN

## 2015-08-23 MED ORDER — METOPROLOL TARTRATE 1 MG/ML IV SOLN
5.0000 mg | Freq: Four times a day (QID) | INTRAVENOUS | Status: DC
  Administered 2015-08-23 (×2): 5 mg via INTRAVENOUS
  Filled 2015-08-23 (×2): qty 5

## 2015-08-23 MED ORDER — SODIUM CHLORIDE 0.9 % IV SOLN
1000.0000 mL | INTRAVENOUS | Status: DC
Start: 2015-08-23 — End: 2015-08-23
  Administered 2015-08-23: 1000 mL via INTRAVENOUS

## 2015-08-23 MED ORDER — MUPIROCIN 2 % EX OINT
1.0000 "application " | TOPICAL_OINTMENT | Freq: Two times a day (BID) | CUTANEOUS | Status: AC
  Administered 2015-08-24 – 2015-08-28 (×10): 1 via NASAL
  Filled 2015-08-23: qty 22

## 2015-08-23 MED ORDER — DIATRIZOATE MEGLUMINE & SODIUM 66-10 % PO SOLN
90.0000 mL | Freq: Once | ORAL | Status: AC
  Administered 2015-08-23: 90 mL via NASOGASTRIC
  Filled 2015-08-23: qty 90

## 2015-08-23 MED ORDER — ONDANSETRON HCL 4 MG/2ML IJ SOLN
4.0000 mg | Freq: Four times a day (QID) | INTRAMUSCULAR | Status: DC | PRN
  Administered 2015-08-27: 4 mg via INTRAVENOUS
  Filled 2015-08-23 (×2): qty 2

## 2015-08-23 MED ORDER — LORAZEPAM 2 MG/ML IJ SOLN
1.0000 mg | Freq: Four times a day (QID) | INTRAMUSCULAR | Status: DC | PRN
  Administered 2015-08-23 – 2015-08-24 (×4): 1 mg via INTRAVENOUS
  Filled 2015-08-23 (×4): qty 1

## 2015-08-23 MED ORDER — MORPHINE SULFATE (PF) 4 MG/ML IV SOLN
4.0000 mg | Freq: Once | INTRAVENOUS | Status: AC
  Administered 2015-08-23: 4 mg via INTRAVENOUS
  Filled 2015-08-23: qty 1

## 2015-08-23 MED ORDER — MORPHINE SULFATE (PF) 2 MG/ML IV SOLN
2.0000 mg | INTRAVENOUS | Status: DC | PRN
  Administered 2015-08-23 – 2015-08-27 (×16): 2 mg via INTRAVENOUS
  Filled 2015-08-23 (×17): qty 1

## 2015-08-23 NOTE — Progress Notes (Signed)
D/w RN at [facility] that patient is to be no further escalation of care.   On exam pt with some abd pain and no signs of peritonitis  Rec con't NGT No surgical plans at this time as pt's family does not want escalation of care.

## 2015-08-23 NOTE — Progress Notes (Signed)
Dr. Tedra Senegal notified of lactate of 3.9 at 2030. At that time, MD informed RN that it is not necessary to page hospitalist overnight for critical lab values or blood pressure, as plan is to keep pt calm and comfortable. MD asked to be paged if pt has increased agitation. Family currently at bedside, pt resting comfortably. Will continue to monitor.

## 2015-08-23 NOTE — ED Notes (Signed)
Dr. Doyle Askew, hospitalist at bedside.

## 2015-08-23 NOTE — ED Notes (Signed)
Bed: WA12 Expected date:  Expected time:  Means of arrival:  Comments: 

## 2015-08-23 NOTE — Progress Notes (Addendum)
Pt seen at bedside, admitted after midnight, please see Dr. Criss Rosales admission note. Pt very agitated this AM, admitted for recurrent SBO, NGT in place with copious amount of drainage. Surgery consulted. Please note that pt met criteria for sepsis and I have placed sepsis order set, start broad spectrum ABX vanc and zosyn, initiate IVF, lactic acid as suspected elevated. Monitor lactic acid level with IVF support. CBC and BMP in AM. Appreciate surgery team consult. PCT also consulted as pt with guarded prognosis. He needs to be in SDU.   Addendum: Spoke with daughter over the phone, critical nature of acute illness, multiple acute issues, sepsis from UTI with multi organ dysfunction, SBO, demands ischemia, coagulopathy. She is aware of guarded prognosis, pt is DNR, continue current measure, re evaluation in AM. I provided emotional support to daughter.   Addendum: Updated daughter and granddaughter.  Spoke with PCCM, surgery, cardiology on call, pt now with progressively worsening troponins, lactic acid worse, Cr trending up. Pt determined not to be surgical candidate and family does not want that either, they are OK with transitioning to comfort if pt continues to decline.   Faye Ramsay, MD  [facility] Pager [phone] Cell [phone]  If 7PM-7AM, please contact night-coverage [email or web address] Password [email or web address]

## 2015-08-23 NOTE — ED Provider Notes (Signed)
CSN: [id number]     Arrival date & time 08/23/15  0149 History  By signing my name below, I, Irene Pap, attest that this documentation has been prepared under the direction and in the presence of Delora Fuel, MD. Electronically Signed: Irene Pap, ED Scribe. 08/23/2015. 2:21 AM.    Chief Complaint  Patient presents with  . Bowel Obstruction    The history is provided by a relative. No language interpreter was used.  HPI Comments (Level 5 Caveat due to dementia): [name] is a 82 y.o. Male brought in by EMS from [place] with a hx of A-Fib, longtime use of anti-coagulants, bradycardia-tachycardia syndrome, BPH, dementia, and DNR who presents to the Emergency Department complaining of gradually worsening generalized abdominal pain onset 2 days ago. Pt was discharged from the hospital two days ago for SBO with a diet of clear liquids. Per relative, pt has not had a bowel movement since 08/19/15 and has had attempts with digital disimpaction, enema, Senna and Miralax to no relief. She states that pt is also complaining about being very hungry and was vomiting at [facility]. He was given Phenergan PTA. She reports an x-ray and labs were performed at [facility]. She states that pt is not at his baseline and has been confused since being admitted to the hospital earlier in the week. She states that his catheter came out this morning.   Paperwork from nursing home [place] of [address] and abdominal x-rays showed constipation without bowel obstruction.   Past Medical History  Diagnosis Date  . Hyperlipidemia   . Atrial fibrillation (HCC)     permanent afib  . Long-term (current) use of anticoagulants     previously on coumadin, stopped by Dr [name] due to frequent falls  . BRADYCARDIA-TACHYCARDIA SYNDROME 2007    s/p PPM by Dr [name]  . BPH (benign prostatic hyperplasia)   . Glaucoma   . Pure hypercholesterolemia   . DNR (do not resuscitate)   . Dementia    Past Surgical History   Procedure Laterality Date  . Abdominal surgery  1986  . Hernia repair    . Pacemaker insertion  01/28/06; 08-07-13    PPM  (MDT) implanted by Dr [name] 2007; MDT [id number] gen change by Dr Rayann Heman 08/2013  . Permanent pacemaker generator change N/A 08/07/2013    Procedure: PERMANENT PACEMAKER GENERATOR CHANGE;  Surgeon: Coralyn Mark, MD;  Location: [facility];  Service: Cardiovascular;  Laterality: N/A;   Family History  Problem Relation Age of Onset  . Heart attack Father    Social History  Substance Use Topics  . Smoking status: Former Smoker -- 1.00 packs/day for 25 years    Types: Cigarettes    Quit date: 08/02/1961  . Smokeless tobacco: Never Used  . Alcohol Use: No    Review of Systems  Unable to perform ROS: Dementia   Allergies  Tramadol  Home Medications   Prior to Admission medications   Medication Sig Start Date End Date Taking? Authorizing Provider  acetaminophen (TYLENOL) 325 MG tablet Take 650 mg by mouth See admin instructions. Take 2 tablets (650 mg) 2 times daily, may take 2 more tablets mid-day as needed for pain    Historical Provider, MD  aspirin EC 81 MG tablet Take 81 mg by mouth daily.    Historical Provider, MD  digoxin (LANOXIN) 0.125 MG tablet Take 1 tablet (125 mcg total) by mouth daily. 02/05/15   Burtis Junes, NP  loteprednol (LOTEMAX) 0.5 %  ophthalmic suspension Place 1 drop into both eyes daily as needed (itching eyes).     Historical Provider, MD  metoprolol succinate (TOPROL-XL) 25 MG 24 hr tablet TAKE 1 TABLET (25 MG TOTAL) BY MOUTH DAILY. TAKE WITH OR IMMEDIATELY FOLLOWING A MEAL. 04/10/15   [name], MD  pantoprazole (PROTONIX) 40 MG tablet Take 1 tablet (40 mg total) by mouth daily. 08/21/15   Theodis Blaze, MD  polyethylene glycol (MIRALAX / Floria Raveling) packet Take 17 g by mouth 2 (two) times daily. 08/21/15   Bobby Rumpf York, PA-C  tamsulosin (FLOMAX) 0.4 MG CAPS capsule Take 0.4 mg by mouth at bedtime.    Historical Provider, MD   BP 115/60  mmHg  Pulse 61  Temp(Src) 98.4 F (36.9 C) (Axillary)  Resp 18  SpO2 100% Physical Exam  Constitutional: He appears well-developed and well-nourished.  HENT:  Head: Normocephalic and atraumatic.  Eyes: EOM are normal. Pupils are equal, round, and reactive to light.  Neck: Normal range of motion. Neck supple. No JVD present.  Cardiovascular: Normal rate and normal heart sounds.  An irregular rhythm present. Exam reveals no gallop and no friction rub.   No murmur heard. Pulmonary/Chest: Effort normal and breath sounds normal. He has no wheezes. He has no rales. He exhibits no tenderness.  Abdominal: Soft. He exhibits distension. He exhibits no mass. Bowel sounds are decreased. There is tenderness. There is no rebound and no guarding.  Slightly distended; severe tenderness generalized  Musculoskeletal: Normal range of motion. He exhibits no edema or tenderness.  Lymphadenopathy:    He has no cervical adenopathy.  Neurological: He is alert. No cranial nerve deficit.  disoriented  Skin: Skin is warm and dry. No rash noted.  Nursing note and vitals reviewed.   ED Course  Procedures (including critical care time) DIAGNOSTIC STUDIES: Oxygen Saturation is 100% on RA, normal by my interpretation.    COORDINATION OF CARE: 2:14 AM-Discussed treatment plan which includes labs and EKG with family at bedside and family agreed to plan.    Labs Review Results for orders placed or performed during the hospital encounter of 08/23/15  Comprehensive metabolic panel  Result Value Ref Range   Sodium 141 135 - 145 mmol/L   Potassium 4.1 3.5 - 5.1 mmol/L   Chloride 104 101 - 111 mmol/L   CO2 26 22 - 32 mmol/L   Glucose, Bld 76 65 - 99 mg/dL   BUN 37 (H) 6 - 20 mg/dL   Creatinine, Ser 1.49 (H) 0.61 - 1.24 mg/dL   Calcium 8.5 (L) 8.9 - 10.3 mg/dL   Total Protein 5.8 (L) 6.5 - 8.1 g/dL   Albumin 3.1 (L) 3.5 - 5.0 g/dL   AST 31 15 - 41 U/L   ALT 18 17 - 63 U/L   Alkaline Phosphatase 50 38 - 126  U/L   Total Bilirubin 1.5 (H) 0.3 - 1.2 mg/dL   GFR calc non Af Amer 39 (L) >60 mL/min   GFR calc Af Amer 45 (L) >60 mL/min   Anion gap 11 5 - 15  CBC with Differential  Result Value Ref Range   WBC 19.8 (H) 4.0 - 10.5 K/uL   RBC 3.68 (L) 4.22 - 5.81 MIL/uL   Hemoglobin 12.0 (L) 13.0 - 17.0 g/dL   HCT 36.7 (L) 39.0 - 52.0 %   MCV 99.7 78.0 - 100.0 fL   MCH 32.6 26.0 - 34.0 pg   MCHC 32.7 30.0 - 36.0 g/dL   RDW 13.6  11.5 - 15.5 %   Platelets 142 (L) 150 - 400 K/uL   Neutrophils Relative % 94 %   Neutro Abs 18.6 (H) 1.7 - 7.7 K/uL   Lymphocytes Relative 2 %   Lymphs Abs 0.4 (L) 0.7 - 4.0 K/uL   Monocytes Relative 4 %   Monocytes Absolute 0.8 0.1 - 1.0 K/uL   Eosinophils Relative 0 %   Eosinophils Absolute 0.0 0.0 - 0.7 K/uL   Basophils Relative 0 %   Basophils Absolute 0.0 0.0 - 0.1 K/uL  Lipase, blood  Result Value Ref Range   Lipase 38 11 - 51 U/L  Troponin I  Result Value Ref Range   Troponin I 0.07 (H) <0.031 ng/mL  I-Stat CG4 Lactic Acid, ED  Result Value Ref Range   Lactic Acid, Venous 1.04 0.5 - 2.0 mmol/L   Imaging Review Ct Abdomen Pelvis Wo Contrast  08/23/2015  CLINICAL DATA:  Acute onset of generalized abdominal pain. Leukocytosis. Known small bowel obstruction. Initial encounter. EXAM: CT ABDOMEN AND PELVIS WITHOUT CONTRAST TECHNIQUE: Multidetector CT imaging of the abdomen and pelvis was performed following the standard protocol without IV contrast. COMPARISON:  CT the abdomen and pelvis performed 08/14/2015 FINDINGS: Trace bilateral pleural effusions are noted, with mild bibasilar atelectasis and a bleb at the left lung base. A small amount of ascites is noted tracking about the liver and within the pelvis, increased from the prior study. Mild fluid is seen tracking along the paracolic gutters bilaterally. The extent of small bowel dilatation has worsened from the prior study, now measuring up to 4.9 cm in diameter. The stomach is also filled with fluid and air.  There is worsened mesenteric edema at the right lower quadrant, with new engorgement of associated mesenteric nodes. On following the bowel, there appears to be a complex knot created by the terminal ileum, through which the mid to distal ileum extends. The mid to distal ileum obstructs as it passes through the knot. The terminal ileum also appears to twist on itself, with an apparent mesenteric band extending about the terminal ileal knot. This may reflect a complex configuration of small bowel volvulus. No typical internal hernia resides in this location, though internal herniation through a postoperative mesenteric defect might result in a similar appearance. The liver and spleen are unremarkable in appearance. The gallbladder is within normal limits. The pancreas and adrenal glands are unremarkable. A 1.8 cm cyst is noted at the interpole region of the left kidney. Mild left-sided perinephric fluid is noted. There is no evidence of hydronephrosis. No renal or ureteral stones are seen. No acute vascular abnormalities are seen. Scattered calcification is noted along the abdominal aorta and its branches. The appendix is normal in caliber, without evidence of appendicitis. A small amount of residual stool is noted within the colon. Scattered diverticulosis is noted along the entirety of the colon. The rectum contains only a small amount of stool. The bladder is moderately distended and partially filled with air. This may reflect recent Foley catheter placement. Would correlate clinically. The prostate is enlarged, measuring 5.5 cm in transverse dimension. No inguinal lymphadenopathy is seen. No acute osseous abnormalities are identified. The patient is status post decompression at L5. Mild degenerative change is noted along the lower lumbar spine. IMPRESSION: 1. Interval worsening in small bowel dilatation, measuring up to 4.9 cm in diameter, reflecting worsening relatively high-grade small bowel obstruction.  Stomach also filled with fluid and air. Worsening mesenteric edema at the right lower quadrant, with new  engorgement of associated mesenteric nodes. This appears to reflect obstruction due to a complex knot created by the terminal ileum, through which the mid to distal ileum extends. The terminal ileum also appears to twist on itself, with an apparent mesenteric band extending about the knot. This may reflect a complex configuration of small bowel volvulus, or possibly internal herniation through a postoperative mesenteric defect. 2. Small amount of ascites tracking about the liver and within the pelvis, increased from the prior study. Mild fluid tracks along the paracolic gutters bilaterally. 3. Bladder moderately distended and partially filled with air. This may reflect recent Foley catheter placement. Would correlate clinically. 4. Trace bilateral pleural effusions, with mild bibasilar atelectasis and a bleb at the left lung base. 5. Small left renal cyst noted. 6. Scattered calcification along the abdominal aorta and its branches. 7. Scattered diverticulosis along the entirety of the colon. The colon and rectum contain only a small amount of stool. Laxatives and enemas are not indicated, as the patient's symptoms reflect the underlying small bowel obstruction. 8. Enlarged prostate noted. These results were called by telephone at the time of interpretation on 08/23/2015 at 4:07 am to Dr. Delora Fuel, who verbally acknowledged these results. Electronically Signed   By: Garald Balding M.D.   On: 08/23/2015 04:23   I have personally reviewed and evaluated these images and lab results as part of my medical decision-making. I have personally discussed the radiology findings with radiologist.   EKG Interpretation   Date/Time:  Saturday August 23 2015 02:46:10 EST Ventricular Rate:  120 PR Interval:    QRS Duration: 87 QT Interval:  329 QTC Calculation: 465 R Axis:   49 Text Interpretation:  Atrial  fibrillation Ventricular premature complex  Repolarization abnormality, prob rate related When compared with ECG of  07/22/2015, Nonspecific T wave abnormality is now Present Confirmed by  [facility]  MD, Kenzi Bardwell ([id number]) on 08/23/2015 3:04:52 AM      MDM   Final diagnoses:  Small bowel obstruction (HCC)  Acute kidney injury (nontraumatic) (HCC)  Normochromic normocytic anemia  Elevated troponin I level  Atrial fibrillation, chronic (HCC)    Generalized abdominal tenderness with report of leukocytosis from nursing home. Old records are reviewed and he had just been discharged yesterday following hospitalization for small bowel obstruction. It was noted that he had a rise in creatinine on his last day of hospitalization. His abdominal exam today is worrisome for more serious conditions than constipation. His daughter is here and I have discussed the findings. Also, it is noted that he had a palliative care consult prior to discharge where discussion had been made about palliative care in the nursing home and transition to hospice care. Daughter states that she had talked with the palliative care 24 assistant but actually was not proceeding with hospice care. I had a general discussion about whether she would want surgery done if a condition requiring surgery was found and she was noncommittal on this. CT of the abdomen and pelvis was obtained which showed small bowel obstruction with questionable volvulus. However, lactic acid level is normal. There has been further worsening of his creatinine. He was given IV hydration. Massive gastric dilatation was noted and a had an NG tube placed which is pulled out 700 mL of gastric contents as of the time of this dictation. Case is discussed with Dr. Myna Hidalgo of [facility] who agrees to admit the patient. Also, he is noted to have a mildly elevated troponin. No evidence of primary  cardiac events of this is presumably demand ischemia.  I personally  performed the services described in this documentation, which was scribed in my presence. The recorded information has been reviewed and is accurate.      Delora Fuel, MD [phone]

## 2015-08-23 NOTE — ED Notes (Signed)
Resting quietly with eye closed. Easily arousable. Verbally responsive. Resp even and unlabored. ABC's intact. A. Fib on monitor. IV infusing NS at 137ml/hr without difficulty.

## 2015-08-23 NOTE — ED Notes (Signed)
Dr. Doyle Askew called and reported that pt is to go to ICU-step down.

## 2015-08-23 NOTE — ED Notes (Signed)
Resting quietly with eye closed. Easily arousable. Verbally responsive. Resp even and unlabored. ABC's intact. A. Fib on the monitor. IV infusing NS at 146ml/hr without difficulty. Pt had no reaction from IV ABT. Family at bedside.

## 2015-08-23 NOTE — ED Notes (Addendum)
Waiting on response from Dr. Doyle Askew concerning abnormal labs and BP.  Supportive care with IVF, would prefer he goes to SDU rather than telemetry. Lactic acid per protocol.   Faye Ramsay, MD  [facility] Pager [phone]  If 7PM-7AM, please contact night-coverage [email or web address] Password [email or web address]

## 2015-08-23 NOTE — ED Notes (Signed)
Pt transported via EMS from [facility] for increased abd pain, known SBO. WBC up to 24.9, attempts made by staff by digital disimpacting, enema and given Senna and Miralax. Per EMS pt becomes agitated with care measures. Pt did arrive with DNR. Per EMS pt does spit when agitated.

## 2015-08-23 NOTE — ED Notes (Signed)
Awake. Verbally responsive. A/O x1. Resp even and unlabored. No audible adventitious breath sounds noted. ABC's intact. A.Fib on monitor. IV infusing NS at 144ml/hr without difficulty. Pt noted with agitation and hallucination. Waiting on order. Family at bedside.

## 2015-08-23 NOTE — ED Notes (Signed)
Resting quietly with eye closed. Easily arousable. Verbally responsive. Resp even and unlabored. ABC's intact.   

## 2015-08-23 NOTE — ED Notes (Signed)
Resting quietly with eye closed. Easily arousable. Verbally responsive. Resp even and unlabored. ABC's intact. Pt noted with decrease in BP. IV infusing NS and ABT without difficulty. Will make Dr. Doyle Askew aware. Family at bedside.

## 2015-08-23 NOTE — ED Notes (Addendum)
Called and spoken to Dr. Doyle Askew and made aware of BP 97/76, temp 101.4 oral, HR 153-180's, continues A. Fib on monitor with order to continue Metoprolol 5mg  x1 and plans to order more medications. Pt started on O2 at this time.

## 2015-08-23 NOTE — ED Notes (Signed)
Nurse drawing labs. 

## 2015-08-23 NOTE — Progress Notes (Signed)
CRITICAL VALUE ALERT  Critical value received:  Lactate 3.9  Date of notification:  08/23/2015  Time of notification:  2000  Critical value read back:Yes.    Nurse who received alert:  Odis Hollingshead RN  MD notified (1st page):  Dr. Doyle Askew  Time of first page:  2000  MD notified (2nd page):  Time of second page:  Responding MD:  Dr. Doyle Askew  Time MD responded: 2000

## 2015-08-23 NOTE — ED Notes (Signed)
Attempted venipuncture to obtain blood specimen, was only able to obtain CBC and CMP. Will call lab to ask to obtain lactic acid.

## 2015-08-23 NOTE — ED Notes (Signed)
Resting quietly with eye closed. Easily arousable. Verbally responsive. Resp even and unlabored. ABC's intact. A.Fib on monitor. IV infusing NS at 131ml/rh without difficulty. Family at bedside. Dght reported pt having visual hallucinations.

## 2015-08-23 NOTE — ED Notes (Signed)
Awake. Verbally responsive. Resp even and unlabored. No audible adventitious breath sounds noted. ABC's intact. Abd soft/nondistended but tender to palpate. BS (+) and active x4 quadrants. No N/V/D reported. NG in rt nare patent and intact with light brownish drainage noted at [facility]. IV infusing NS at 170ml/hr without difficulty. Family at bedside.

## 2015-08-23 NOTE — H&P (Signed)
[facility] History and Physical  [name] [id number] DOB: Aug 25, 1922 DOA: 08/23/2015  Referring physician: ED physician PCP: Odette Fraction, MD  Specialists:  Dr. Rayann Heman, cardiology   Chief Complaint:  Constipation, abd pain  HPI: [name] is a 82 y.o. male with PMH of chronic atrial fibrillation no longer on anticoagulation, tachy-brady syndrome status post pacer placement, and dementia who presents from his nursing home with complaints of constipation and abdominal pain. Patient was just discharged from this institution on 08/19/2015 following management of an SBO. He was discharged in improved and stable condition with a clear liquid diet. Per report patient has had increased confusion since the recent hospitalization, increasing abdominal distention, abdominal pain, and his not moved his bowels in the last 4-5 days despite an aggressive bowel regimen at his SNF with enemas, senna, MiraLAX, and attempts at manual disimpaction. Per report, the patient has not been febrile or had any other pain complaints. There was one episode of nonbloody nonbilious vomiting after taking some clear liquids earlier in the day. Patient is unable to describe his pain secondary to dementia and cognitive deficits.  In ED, patient was found to be afebrile, saturating well on room air, and with vital signs stable. Initial blood work is notable for a leukocytosis to 20,000, and a serum creatinine of 1.5, up from an apparent baseline of 1. CT of the abdomen was obtained and official read pending. Per discussion with radiologist, there is an SBO with peculiar configuration of the distal ileum and suspicion for possible volvulus. An NG tube was placed to suction with return of thick yellow liquid. Patient was bolused 1 L of normal saline, given morphine for pain, and the hospitalists were asked to admit for ongoing evaluation and management of SBO.  Where does patient live?     SNF    Can  patient participate in ADLs?  Some   Review of Systems:   Unable to obtain secondary to patient's clinical condition with dementia and confusion.   Allergy:  Allergies  Allergen Reactions  . Tramadol Other (See Comments)    Extreme sleepiness and irritable    Past Medical History  Diagnosis Date  . Hyperlipidemia   . Atrial fibrillation (HCC)     permanent afib  . Long-term (current) use of anticoagulants     previously on coumadin, stopped by Dr [name] due to frequent falls  . BRADYCARDIA-TACHYCARDIA SYNDROME 2007    s/p PPM by Dr [name]  . BPH (benign prostatic hyperplasia)   . Glaucoma   . Pure hypercholesterolemia   . DNR (do not resuscitate)   . Dementia     Past Surgical History  Procedure Laterality Date  . Abdominal surgery  1986  . Hernia repair    . Pacemaker insertion  01/28/06; 08-07-13    PPM  (MDT) implanted by Dr [name] 2007; MDT [id number] gen change by Dr Rayann Heman 08/2013  . Permanent pacemaker generator change N/A 08/07/2013    Procedure: PERMANENT PACEMAKER GENERATOR CHANGE;  Surgeon: Coralyn Mark, MD;  Location: [facility];  Service: Cardiovascular;  Laterality: N/A;    Social History:  reports that he quit smoking about 54 years ago. His smoking use included Cigarettes. He has a 25 pack-year smoking history. He has never used smokeless tobacco. He reports that he does not drink alcohol or use illicit drugs.  Family History:  Family History  Problem Relation Age of Onset  . Heart attack Father      Prior  to Admission medications   Medication Sig Start Date End Date Taking? Authorizing Provider  acetaminophen (TYLENOL) 325 MG tablet Take 650 mg by mouth See admin instructions. Take 2 tablets (650 mg) 2 times daily, may take 2 more tablets mid-day as needed for pain   Yes Historical Provider, MD  aspirin EC 81 MG tablet Take 81 mg by mouth daily.   Yes Historical Provider, MD  digoxin (LANOXIN) 0.125 MG tablet Take 1 tablet (125 mcg total) by mouth  daily. 02/05/15  Yes Burtis Junes, NP  loteprednol (LOTEMAX) 0.5 % ophthalmic suspension Place 1 drop into both eyes daily as needed (itching eyes).    Yes Historical Provider, MD  metoprolol succinate (TOPROL-XL) 25 MG 24 hr tablet TAKE 1 TABLET (25 MG TOTAL) BY MOUTH DAILY. TAKE WITH OR IMMEDIATELY FOLLOWING A MEAL. 04/10/15  Yes [name], MD  mineral oil enema Place 1 enema rectally once.   Yes Historical Provider, MD  pantoprazole (PROTONIX) 40 MG tablet Take 1 tablet (40 mg total) by mouth daily. 08/21/15  Yes Theodis Blaze, MD  polyethylene glycol (MIRALAX / Floria Raveling) packet Take 17 g by mouth 2 (two) times daily. 08/21/15  Yes Marianne L York, PA-C  senna-docusate (SENOKOT-S) 8.6-50 MG tablet Take 1 tablet by mouth daily.   Yes Historical Provider, MD  tamsulosin (FLOMAX) 0.4 MG CAPS capsule Take 0.4 mg by mouth at bedtime.   Yes Historical Provider, MD    Physical Exam: Filed Vitals:   08/23/15 0205 08/23/15 0300  BP: 115/60 131/61  Pulse: 61 99  Temp: 98.4 F (36.9 C)   TempSrc: Axillary   Resp: 18 21  SpO2: 100% 99%   General: Not in acute distress HEENT:       Eyes: PERRL, EOMI, no scleral icterus or conjunctival pallor.       ENT: No discharge from the ears or nose, no pharyngeal ulcers, petechiae or exudate, no tonsillar enlargement. NGT draining bilious liquid       Neck: No JVD, no bruit, no appreciable mass Heme: No cervical adenopathy, no pallor Cardiac: Irregular rhythm, soft midsystolic murmur, No gallops or rubs. Pulm: Good air movement bilaterally. No rales, wheezing, rhonchi or rubs. Abd: Soft, distended, mildly tender throughout, no rebound pain or gaurding, no mass or organomegaly, bowel sounds not appreciated. Ext: No LE edema bilaterally. 1+DP/PT pulse bilaterally. Musculoskeletal: No gross deformity, no red, hot, swollen joints   Skin: No rashes or wounds on exposed surfaces  Neuro: Alert, oriented to person only, cranial nerves II-XII grossly intact. No  focal findings Psych: Patient is not overtly psychotic, confused, but pleasant and cooperative.   Labs on Admission:  Basic Metabolic Panel:  Recent Labs Lab 08/18/15 0415 08/19/15 0355 08/20/15 0510 08/21/15 0515 08/23/15 0240  NA 149* 145 139 136 141  K 3.8 3.4* 3.9 4.0 4.1  CL 113* 107 104 104 104  CO2 27 29 26 25 26   GLUCOSE 101* 117* 110* 113* 76  BUN 34* 25* 23* 26* 37*  CREATININE 1.10 1.03 1.13 1.40* 1.49*  CALCIUM 8.7* 8.5* 8.2* 8.4* 8.5*  MG 2.2  --  1.8  --   --    Liver Function Tests:  Recent Labs Lab 08/23/15 0240  AST 31  ALT 18  ALKPHOS 50  BILITOT 1.5*  PROT 5.8*  ALBUMIN 3.1*    Recent Labs Lab 08/23/15 0240  LIPASE 38   No results for input(s): AMMONIA in the last 168 hours. CBC:  Recent Labs Lab  08/17/15 0836 08/19/15 0355 08/20/15 0510 08/21/15 0515 08/23/15 0240  WBC 6.2 7.5 10.1 10.0 19.8*  NEUTROABS  --   --   --   --  18.6*  HGB 12.2* 13.4 13.1 12.4* 12.0*  HCT 39.0 42.3  QUANTITY NOT SUFFICIENT, UNABLE TO PERFORM TEST 41.4 38.3* 36.7*  MCV 103.4* 101.9* 101.2* 100.5* 99.7  PLT 109* 122* 135* 147* 142*   Cardiac Enzymes:  Recent Labs Lab 08/23/15 0240  TROPONINI 0.07*    BNP (last 3 results) No results for input(s): BNP in the last 8760 hours.  ProBNP (last 3 results) No results for input(s): PROBNP in the last 8760 hours.  CBG: No results for input(s): GLUCAP in the last 168 hours.  Radiological Exams on Admission: Ct Abdomen Pelvis Wo Contrast  08/23/2015  CLINICAL DATA:  Acute onset of generalized abdominal pain. Leukocytosis. Known small bowel obstruction. Initial encounter. EXAM: CT ABDOMEN AND PELVIS WITHOUT CONTRAST TECHNIQUE: Multidetector CT imaging of the abdomen and pelvis was performed following the standard protocol without IV contrast. COMPARISON:  CT the abdomen and pelvis performed 08/14/2015 FINDINGS: Trace bilateral pleural effusions are noted, with mild bibasilar atelectasis and a bleb at the  left lung base. A small amount of ascites is noted tracking about the liver and within the pelvis, increased from the prior study. Mild fluid is seen tracking along the paracolic gutters bilaterally. The extent of small bowel dilatation has worsened from the prior study, now measuring up to 4.9 cm in diameter. The stomach is also filled with fluid and air. There is worsened mesenteric edema at the right lower quadrant, with new engorgement of associated mesenteric nodes. On following the bowel, there appears to be a complex knot created by the terminal ileum, through which the mid to distal ileum extends. The mid to distal ileum obstructs as it passes through the knot. The terminal ileum also appears to twist on itself, with an apparent mesenteric band extending about the terminal ileal knot. This may reflect a complex configuration of small bowel volvulus. No typical internal hernia resides in this location, though internal herniation through a postoperative mesenteric defect might result in a similar appearance. The liver and spleen are unremarkable in appearance. The gallbladder is within normal limits. The pancreas and adrenal glands are unremarkable. A 1.8 cm cyst is noted at the interpole region of the left kidney. Mild left-sided perinephric fluid is noted. There is no evidence of hydronephrosis. No renal or ureteral stones are seen. No acute vascular abnormalities are seen. Scattered calcification is noted along the abdominal aorta and its branches. The appendix is normal in caliber, without evidence of appendicitis. A small amount of residual stool is noted within the colon. Scattered diverticulosis is noted along the entirety of the colon. The rectum contains only a small amount of stool. The bladder is moderately distended and partially filled with air. This may reflect recent Foley catheter placement. Would correlate clinically. The prostate is enlarged, measuring 5.5 cm in transverse dimension. No  inguinal lymphadenopathy is seen. No acute osseous abnormalities are identified. The patient is status post decompression at L5. Mild degenerative change is noted along the lower lumbar spine. IMPRESSION: 1. Interval worsening in small bowel dilatation, measuring up to 4.9 cm in diameter, reflecting worsening relatively high-grade small bowel obstruction. Stomach also filled with fluid and air. Worsening mesenteric edema at the right lower quadrant, with new engorgement of associated mesenteric nodes. This appears to reflect obstruction due to a complex knot created by the  terminal ileum, through which the mid to distal ileum extends. The terminal ileum also appears to twist on itself, with an apparent mesenteric band extending about the knot. This may reflect a complex configuration of small bowel volvulus, or possibly internal herniation through a postoperative mesenteric defect. 2. Small amount of ascites tracking about the liver and within the pelvis, increased from the prior study. Mild fluid tracks along the paracolic gutters bilaterally. 3. Bladder moderately distended and partially filled with air. This may reflect recent Foley catheter placement. Would correlate clinically. 4. Trace bilateral pleural effusions, with mild bibasilar atelectasis and a bleb at the left lung base. 5. Small left renal cyst noted. 6. Scattered calcification along the abdominal aorta and its branches. 7. Scattered diverticulosis along the entirety of the colon. The colon and rectum contain only a small amount of stool. Laxatives and enemas are not indicated, as the patient's symptoms reflect the underlying small bowel obstruction. 8. Enlarged prostate noted. These results were called by telephone at the time of interpretation on 08/23/2015 at 4:07 am to Dr. Delora Fuel, who verbally acknowledged these results. Electronically Signed   By: Garald Balding M.D.   On: 08/23/2015 04:23    EKG: Independently reviewed.  Abnormal  findings:   Atrial fibrillation, rate 120, non-specific repolarization abnormality  Assessment/Plan  1. SBO  - CT evidence of SBO, likely secondary to adhesions given h/o abdominal hernia surgeries  - NGT placed to suction  - NPO until return of bowel function  - Concern for possible volvulus raised by radiologist, initial lactate reassuring  - Trend lactate  - Pain-control  - Serial abdominal exams  - Surgical consult if fails to resolve spontaneously over next 24-48 hrs; daughter unable to answer whether they would want to proceed with a surgical intervention, she will think about it   2. AKI - SCr 1.49 on presentation, up from apparent baseline of 1  - Likely a prerenal azotemia in setting of poor PO tolerance and dehydration  - Anticipate improvement with IVF; received 1 L NS bolus in ED, continuing with NS at 100 cc/hr overnight  - Repeat chemistries tomorrow   - Avoid nephrotoxins where possible    3. Chronic atrial fibrillation  - Per outpt notes, warfarin stopped d/t repeated falls  - Rate is currently uncontrolled at 120 bpm, giving metoprolol IVP   - While NPO, will convert home meds metoprolol and digoxin to IV  - Monitor on telemetry   4. Leukocytosis  - WBC 20,000 on admission  - Urine could not yet be obtained for analysis  - Saturating well on rm air without cough, no wounds identified  - Could be stress demargination  - Follow up UA, obtain blood cultures if febrile   5. Normocytic anemia, thrombocytopenia   - Appears to be stable, at baseline  - Hgb 12, platelets 142  - Monitoring    6. Elevated cardiac biomarker  - Troponin 0.07 in ED  - No acute ischemic changes on EKG, no CP  - Likely secondary to A fib with rapid rate  - Trend troponin, monitor on telemetry  - Repeat EKG in am      DVT ppx: SQ Heparin     Code Status: Full code Family Communication: Yes, patient's daughter at bed side Disposition Plan: Admit to inpatient   Date of Service  08/23/2015    Vianne Bulls, MD [facility] Pager [phone]  If 7PM-7AM, please contact night-coverage [email or web address] Password [email or web address] 08/23/2015, 5:02 AM

## 2015-08-23 NOTE — Progress Notes (Signed)
Subjective: Pt was recently seen and Dc'd from [facility] for SBO that had resolved.  Pt was sent to SNF.  Pts daughter states that he had some n/v and decreased PO and abd pain while there.  No BMs.  Pt had blood drawn which showed eleve WBC.  CT in ED revealed SBO/?SB volvulus.  Objective: Vital signs in last 24 hours: Temp:  [98.4 F (36.9 C)-101.4 F (38.6 C)] 101.4 F (38.6 C) (01/21 1200) Pulse Rate:  [61-117] 117 (01/21 1200) Resp:  [13-24] 18 (01/21 1200) BP: (97-139)/(37-88) 97/76 mmHg (01/21 1200) SpO2:  [92 %-100 %] 93 % (01/21 1200)    Intake/Output from previous day: 01/20 0701 - 01/21 0700 In: 1000 [I.V.:1000] Out: 1100 [Emesis/NG output:150] Intake/Output this shift:    General appearance: alert Cardio: tachy, irreg irreg GI: midline scar, soft, nttp, no rebound guarding  Lab Results:   Recent Labs  08/21/15 0515 08/23/15 0240  WBC 10.0 19.8*  HGB 12.4* 12.0*  HCT 38.3* 36.7*  PLT 147* 142*   BMET  Recent Labs  08/21/15 0515 08/23/15 0240  NA 136 141  K 4.0 4.1  CL 104 104  CO2 25 26  GLUCOSE 113* 76  BUN 26* 37*  CREATININE 1.40* 1.49*  CALCIUM 8.4* 8.5*   PT/INR  Recent Labs  08/23/15 0524  LABPROT 17.6*  INR 1.43    Studies/Results: Ct Abdomen Pelvis Wo Contrast  08/23/2015  CLINICAL DATA:  Acute onset of generalized abdominal pain. Leukocytosis. Known small bowel obstruction. Initial encounter. EXAM: CT ABDOMEN AND PELVIS WITHOUT CONTRAST TECHNIQUE: Multidetector CT imaging of the abdomen and pelvis was performed following the standard protocol without IV contrast. COMPARISON:  CT the abdomen and pelvis performed 08/14/2015 FINDINGS: Trace bilateral pleural effusions are noted, with mild bibasilar atelectasis and a bleb at the left lung base. A small amount of ascites is noted tracking about the liver and within the pelvis, increased from the prior study. Mild fluid is seen tracking along the paracolic gutters bilaterally. The extent of  small bowel dilatation has worsened from the prior study, now measuring up to 4.9 cm in diameter. The stomach is also filled with fluid and air. There is worsened mesenteric edema at the right lower quadrant, with new engorgement of associated mesenteric nodes. On following the bowel, there appears to be a complex knot created by the terminal ileum, through which the mid to distal ileum extends. The mid to distal ileum obstructs as it passes through the knot. The terminal ileum also appears to twist on itself, with an apparent mesenteric band extending about the terminal ileal knot. This may reflect a complex configuration of small bowel volvulus. No typical internal hernia resides in this location, though internal herniation through a postoperative mesenteric defect might result in a similar appearance. The liver and spleen are unremarkable in appearance. The gallbladder is within normal limits. The pancreas and adrenal glands are unremarkable. A 1.8 cm cyst is noted at the interpole region of the left kidney. Mild left-sided perinephric fluid is noted. There is no evidence of hydronephrosis. No renal or ureteral stones are seen. No acute vascular abnormalities are seen. Scattered calcification is noted along the abdominal aorta and its branches. The appendix is normal in caliber, without evidence of appendicitis. A small amount of residual stool is noted within the colon. Scattered diverticulosis is noted along the entirety of the colon. The rectum contains only a small amount of stool. The bladder is moderately distended and partially filled with air.  This may reflect recent Foley catheter placement. Would correlate clinically. The prostate is enlarged, measuring 5.5 cm in transverse dimension. No inguinal lymphadenopathy is seen. No acute osseous abnormalities are identified. The patient is status post decompression at L5. Mild degenerative change is noted along the lower lumbar spine. IMPRESSION: 1. Interval  worsening in small bowel dilatation, measuring up to 4.9 cm in diameter, reflecting worsening relatively high-grade small bowel obstruction. Stomach also filled with fluid and air. Worsening mesenteric edema at the right lower quadrant, with new engorgement of associated mesenteric nodes. This appears to reflect obstruction due to a complex knot created by the terminal ileum, through which the mid to distal ileum extends. The terminal ileum also appears to twist on itself, with an apparent mesenteric band extending about the knot. This may reflect a complex configuration of small bowel volvulus, or possibly internal herniation through a postoperative mesenteric defect. 2. Small amount of ascites tracking about the liver and within the pelvis, increased from the prior study. Mild fluid tracks along the paracolic gutters bilaterally. 3. Bladder moderately distended and partially filled with air. This may reflect recent Foley catheter placement. Would correlate clinically. 4. Trace bilateral pleural effusions, with mild bibasilar atelectasis and a bleb at the left lung base. 5. Small left renal cyst noted. 6. Scattered calcification along the abdominal aorta and its branches. 7. Scattered diverticulosis along the entirety of the colon. The colon and rectum contain only a small amount of stool. Laxatives and enemas are not indicated, as the patient's symptoms reflect the underlying small bowel obstruction. 8. Enlarged prostate noted. These results were called by telephone at the time of interpretation on 08/23/2015 at 4:07 am to Dr. Delora Fuel, who verbally acknowledged these results. Electronically Signed   By: Garald Balding M.D.   On: 08/23/2015 04:23    Anti-infectives: Anti-infectives    Start     Dose/Rate Route Frequency Ordered Stop   08/23/15 1245  piperacillin-tazobactam (ZOSYN) IVPB 3.375 g     3.375 g 100 mL/hr over 30 Minutes Intravenous  Once 08/23/15 1235     08/23/15 1245  vancomycin (VANCOCIN)  IVPB 1000 mg/200 mL premix     1,000 mg 200 mL/hr over 60 Minutes Intravenous  Once 08/23/15 1235        Assessment/Plan: 82 y/o M with SBO likely due to adhesions. Pt with large stool burden throughout colon.   At this time would Rec NGT, NPO, IVF.   Will begin SB protocol As discussed with Pt's daughter in the room, pt is a poor operative candidate due to his comorbidities.  She understands and hopes non operative tx will resolve his issue.    LOS: 0 days    Rosario Jacks., [facility] 08/23/2015

## 2015-08-23 NOTE — ED Notes (Signed)
GI surgeon at bedside.

## 2015-08-23 NOTE — Progress Notes (Signed)
ANTIBIOTIC CONSULT NOTE - INITIAL  Pharmacy Consult for Vancomycin & Zosyn Indication: rule out sepsis  Allergies  Allergen Reactions  . Tramadol Other (See Comments)    Extreme sleepiness and irritable    Patient Measurements:    Vital Signs: Temp: 101.4 F (38.6 C) (01/21 1200) Temp Source: Oral (01/21 1200) BP: 97/76 mmHg (01/21 1200) Pulse Rate: 117 (01/21 1200) Intake/Output from previous day: 01/20 0701 - 01/21 0700 In: 1000 [I.V.:1000] Out: 1100 [Emesis/NG output:150] Intake/Output from this shift:    Labs:  Recent Labs  08/21/15 0515 08/23/15 0240  WBC 10.0 19.8*  HGB 12.4* 12.0*  PLT 147* 142*  CREATININE 1.40* 1.49*   Estimated Creatinine Clearance: 28.4 mL/min (by C-G formula based on Cr of 1.49). No results for input(s): VANCOTROUGH, VANCOPEAK, VANCORANDOM, GENTTROUGH, GENTPEAK, GENTRANDOM, TOBRATROUGH, TOBRAPEAK, TOBRARND, AMIKACINPEAK, AMIKACINTROU, AMIKACIN in the last 72 hours.   Microbiology: No results found for this or any previous visit (from the past 720 hour(s)).  Medical History: Past Medical History  Diagnosis Date  . Hyperlipidemia   . Atrial fibrillation (HCC)     permanent afib  . Long-term (current) use of anticoagulants     previously on coumadin, stopped by Dr [name] due to frequent falls  . BRADYCARDIA-TACHYCARDIA SYNDROME 2007    s/p PPM by Dr [name]  . BPH (benign prostatic hyperplasia)   . Glaucoma   . Pure hypercholesterolemia   . DNR (do not resuscitate)   . Dementia     Medications:  Anti-infectives    Start     Dose/Rate Route Frequency Ordered Stop   08/23/15 1245  piperacillin-tazobactam (ZOSYN) IVPB 3.375 g     3.375 g 100 mL/hr over 30 Minutes Intravenous  Once 08/23/15 1235     08/23/15 1245  vancomycin (VANCOCIN) IVPB 1000 mg/200 mL premix     1,000 mg 200 mL/hr over 60 Minutes Intravenous  Once 08/23/15 1235       Assessment: 82 yo M admitted from NH with constipation and abdominal pain.  He was  admitted 1/12-->08/19/15 with short bowel obstruction.  Per surgery, he likely has SBO due to adhesions and plan to admit for medical treatment.   Due to fever spike and elevated WBC, pharmacy asked to start Vanc/Zosyn for possible sepsis.    08/23/2015:  Tm 101.4  WBC elevated at 19.8; LA wnl  Scr elevated above patient's baseline at 1.49.  Estimated CrCl ~ 23ml/min.     Goal of Therapy:  Vancomycin trough level 15-20 mcg/ml  Eradicate infection.  Plan:  Zosyn 3.375gm IV Q8h to be infused over 4hrs Vancomycin 1gm IV x1 in Ed, then 750mg  IV q24h Check Vancomycin trough at steady state Monitor renal function and cx data   Biagio Borg 08/23/2015,1:02 PM

## 2015-08-23 NOTE — ED Notes (Signed)
unsuccessful in and out for urine.

## 2015-08-24 DIAGNOSIS — Z950 Presence of cardiac pacemaker: Secondary | ICD-10-CM

## 2015-08-24 DIAGNOSIS — R100 Acute abdomen: Secondary | ICD-10-CM

## 2015-08-24 DIAGNOSIS — N179 Acute kidney failure, unspecified: Secondary | ICD-10-CM | POA: Diagnosis present

## 2015-08-24 DIAGNOSIS — Z66 Do not resuscitate: Secondary | ICD-10-CM

## 2015-08-24 DIAGNOSIS — I480 Paroxysmal atrial fibrillation: Secondary | ICD-10-CM

## 2015-08-24 DIAGNOSIS — I214 Non-ST elevation (NSTEMI) myocardial infarction: Secondary | ICD-10-CM | POA: Diagnosis present

## 2015-08-24 DIAGNOSIS — Z515 Encounter for palliative care: Secondary | ICD-10-CM

## 2015-08-24 DIAGNOSIS — R6520 Severe sepsis without septic shock: Secondary | ICD-10-CM

## 2015-08-24 DIAGNOSIS — Z7189 Other specified counseling: Secondary | ICD-10-CM | POA: Diagnosis present

## 2015-08-24 DIAGNOSIS — J9601 Acute respiratory failure with hypoxia: Secondary | ICD-10-CM | POA: Diagnosis present

## 2015-08-24 DIAGNOSIS — A419 Sepsis, unspecified organism: Secondary | ICD-10-CM

## 2015-08-24 DIAGNOSIS — N400 Enlarged prostate without lower urinary tract symptoms: Secondary | ICD-10-CM

## 2015-08-24 LAB — CBC
HEMATOCRIT: 32.1 % — AB (ref 39.0–52.0)
HEMOGLOBIN: 10.6 g/dL — AB (ref 13.0–17.0)
MCH: 33.1 pg (ref 26.0–34.0)
MCHC: 33 g/dL (ref 30.0–36.0)
MCV: 100.3 fL — AB (ref 78.0–100.0)
Platelets: 93 10*3/uL — ABNORMAL LOW (ref 150–400)
RBC: 3.2 MIL/uL — ABNORMAL LOW (ref 4.22–5.81)
RDW: 14.1 % (ref 11.5–15.5)
WBC: 57.5 10*3/uL — AB (ref 4.0–10.5)

## 2015-08-24 LAB — COMPREHENSIVE METABOLIC PANEL
ALK PHOS: 61 U/L (ref 38–126)
ALT: 24 U/L (ref 17–63)
ANION GAP: 14 (ref 5–15)
AST: 60 U/L — ABNORMAL HIGH (ref 15–41)
Albumin: 2.5 g/dL — ABNORMAL LOW (ref 3.5–5.0)
BILIRUBIN TOTAL: 1.7 mg/dL — AB (ref 0.3–1.2)
BUN: 46 mg/dL — ABNORMAL HIGH (ref 6–20)
CALCIUM: 7.7 mg/dL — AB (ref 8.9–10.3)
CO2: 20 mmol/L — ABNORMAL LOW (ref 22–32)
Chloride: 109 mmol/L (ref 101–111)
Creatinine, Ser: 2.18 mg/dL — ABNORMAL HIGH (ref 0.61–1.24)
GFR, EST AFRICAN AMERICAN: 29 mL/min — AB (ref >60)
GFR, EST NON AFRICAN AMERICAN: 25 mL/min — AB (ref >60)
Glucose, Bld: 83 mg/dL (ref 65–99)
Potassium: 4.2 mmol/L (ref 3.5–5.1)
Sodium: 143 mmol/L (ref 135–145)
TOTAL PROTEIN: 4.8 g/dL — AB (ref 6.5–8.1)

## 2015-08-24 LAB — TROPONIN I
TROPONIN I: 0.46 ng/mL — AB (ref <0.031)
Troponin I: 0.58 ng/mL (ref <0.031)
Troponin I: 0.5 ng/mL (ref <0.031)
Troponin I: 0.36 ng/mL — ABNORMAL HIGH (ref <0.031)
Troponin I: 0.33 ng/mL — ABNORMAL HIGH (ref <0.031)

## 2015-08-24 LAB — LACTIC ACID, PLASMA
LACTIC ACID, VENOUS: 2 mmol/L (ref 0.5–2.0)
LACTIC ACID, VENOUS: 1.3 mmol/L (ref 0.5–2.0)

## 2015-08-24 LAB — GLUCOSE, CAPILLARY: Glucose-Capillary: 72 mg/dL (ref 65–99)

## 2015-08-24 LAB — MAGNESIUM: Magnesium: 1.7 mg/dL (ref 1.7–2.4)

## 2015-08-24 MED ORDER — LORAZEPAM 2 MG/ML IJ SOLN
1.0000 mg | INTRAMUSCULAR | Status: DC | PRN
  Administered 2015-08-24 – 2015-08-28 (×4): 1 mg via INTRAVENOUS
  Administered 2015-08-28: 2 mg via INTRAVENOUS
  Administered 2015-08-29: 1 mg via INTRAVENOUS
  Filled 2015-08-24 (×6): qty 1

## 2015-08-24 MED ORDER — PIPERACILLIN-TAZOBACTAM IN DEX 2-0.25 GM/50ML IV SOLN
2.2500 g | Freq: Four times a day (QID) | INTRAVENOUS | Status: DC
  Administered 2015-08-24 – 2015-08-25 (×3): 2.25 g via INTRAVENOUS
  Filled 2015-08-24 (×4): qty 50

## 2015-08-24 MED ORDER — VANCOMYCIN HCL IN DEXTROSE 1-5 GM/200ML-% IV SOLN: 1000.0000 mg | INTRAVENOUS | Status: DC

## 2015-08-24 NOTE — Consult Note (Signed)
Consultation Note Date: 08/24/2015   Patient Name: [name]  DOB: [birth year]  MRN: [id number]  Age / Sex: 82 y.o., male  PCP: Donita Brooks, MD Referring Physician: Dorothea Ogle, MD  Reason for Consultation: Establishing goals of care    Clinical Assessment/Narrative:  82 y.o. male with PMH of chronic atrial fibrillation no longer on anticoagulation, tachy-brady syndrome status post pacer placement, and dementia who presented from his nursing home with complaints of constipation and abdominal pain. Patient was just discharged from this institution on 08/19/2015 following management of an SBO. He was discharged in improved and stable condition with a clear liquid diet. Per report patient has had increased confusion since the recent hospitalization, increasing abdominal distention, abdominal pain, and has not moved his bowels in the last 4-5 days despite an aggressive bowel regimen at his SNF with enemas, senna, MiraLAX, and attempts at manual disimpaction.   Patient has been admitted to the hospitalist service since 08-23-15 with severe sepsis,acute organ dysfunction, possible UTI, acute SBO, ? NSTEMI. He is on Vanc and Zosyn, has NGT. Enema was attempted earlier today, was hypotensive overnight. Is in the ICU currently. Discussions ensued between the primary team and the patient's daughter HCPOA agent about the critical nature of the patient's illness and high likelihood of the patient not surviving this hospitalization. Hence, a palliative consult was placed.   The patient is essentially unresponsive, resting in bed, appears weak, has an NGT. Grand daughter at bedside. Discussed with bedside RN, patient requiring Q 6 hours Ativan, has periodic moaning and non verbal gestures of distress and discomfort. Continue PRN IV Morphine for pain.   Call placed and discussed with daughter Suella [place] agent. She recalls speaking  with Dr. Izola Price earlier today, DNR DNI re confirmed, scope of care to focus on comfort measures while continuing with NGT and antibiotics as a time trial for now. Discussed prognosis could be hours-days, discussed high likelihood of hospital death. Patient's deacon is also visiting, hospital chaplain has also met with patient earlier today.     Thank you for the consult, palliative care will follow along and help guide appropriate decision making.   Contacts/Participants in Discussion: Primary Decision Maker:   Patient, wife has dementia, resides in facility, has a son and daughter.   Relationship to Patient  Daughter Dewayne Hatch is HCPOA agent.  HCPOA: yes     SUMMARY OF RECOMMENDATIONS: DNR DNI Time trial of current therapies: NGT, IV Antibiotics for now Do not escalate care Anticipated hospital death: prognosis hours to days discussed with HCPOA daughter Dewayne Hatch over the phone.  Appreciate patient's preacher and hospital chaplain following Continue concurrent comfort measures: IV Morphine PRN and IV Ativan PRN.   Code Status/Advance Care Planning: DNR    Code Status Orders        Start     Ordered   08/23/15 0457  Do not attempt resuscitation (DNR)   Continuous    Question Answer Comment  In the event of cardiac or respiratory ARREST Do not call a "code blue"   In the event of cardiac or respiratory ARREST Do not perform Intubation, CPR, defibrillation or ACLS   In the event of cardiac or respiratory ARREST Use medication by any route, position, wound care, and other measures to relive pain and suffering. May use oxygen, suction and manual treatment of airway obstruction as needed for comfort.      08/23/15 0502    Code Status History    Date Active Date  Inactive Code Status Order ID Comments User Context   08/15/2015  1:38 AM 08/21/2015  5:10 PM DNR [id number]  Lily Kocher, MD Inpatient   08/07/2013 12:56 PM 08/07/2013  5:19 PM Full Code [id number]  [name], MD Inpatient   08/29/2012  12:00 AM 09/02/2012  4:22 PM Full Code [id number]  Kaylyn Lim, RN Inpatient    Advance Directive Documentation        Most Recent Value   Type of Advance Directive  Out of facility DNR (pink MOST or yellow form), Living will   Pre-existing out of facility DNR order (yellow form or pink MOST form)     "MOST" Form in Place?        Other Directives:Other  Symptom Management:   As above  Palliative Prophylaxis:   Delirium Protocol  Additional Recommendations (Limitations, Scope, Preferences):  transitioning towards establishing comfort as primary mode of care     Psycho-social/Spiritual:  Support System: Strong Desire for further Chaplaincy support:yes Additional Recommendations: Caregiving  Support/Resources  Prognosis: Hours - Days  Discharge Planning: Anticipated Hospital Death   Chief Complaint/ Primary Diagnoses: Present on Admission:  . SBO (small bowel obstruction) ([place]) . Abdominal pain, acute . Atrial fibrillation ([place]) . Normocytic anemia . Severe sepsis with acute organ dysfunction ([place]) . NSTEMI (non-ST elevated myocardial infarction) ([place]) . Acute renal failure ([place]) . DNR (do not resuscitate) . Pacemaker-[employer] . BPH (benign prostatic hyperplasia) . Thrombocytopenia ([place]) . Acute respiratory failure with hypoxia ([place]) . Hypocalcemia  I have reviewed the medical record, interviewed the patient and family, and examined the patient. The following aspects are pertinent.  Past Medical History  Diagnosis Date  . Hyperlipidemia   . Atrial fibrillation (HCC)     permanent afib  . Long-term (current) use of anticoagulants     previously on coumadin, stopped by Dr [name] due to frequent falls  . BRADYCARDIA-TACHYCARDIA SYNDROME 2007    s/p PPM by Dr [name]  . BPH (benign prostatic hyperplasia)   . Glaucoma   . Pure hypercholesterolemia   . DNR (do not resuscitate)   . Dementia    Social History   Social History  . Marital Status: Married     Spouse Name: N/A  . Number of Children: N/A  . Years of Education: N/A   Social History Main Topics  . Smoking status: Former Smoker -- 1.00 packs/day for 25 years    Types: Cigarettes    Quit date: 08/02/1961  . Smokeless tobacco: Never Used  . Alcohol Use: No  . Drug Use: No  . Sexual Activity: Not Asked   Other Topics Concern  . None   Social History Narrative   Family History  Problem Relation Age of Onset  . Heart attack Father    Scheduled Meds: . Chlorhexidine Gluconate Cloth  6 each Topical Q0600  . digoxin  0.125 mg Intravenous Daily  . heparin  5,000 Units Subcutaneous 3 times per day  . mupirocin ointment  1 application Nasal BID  . piperacillin-tazobactam (ZOSYN)  IV  2.25 g Intravenous 4 times per day  . sodium chloride  3 mL Intravenous Q12H  . [START ON 08/25/2015] vancomycin  1,000 mg Intravenous Q48H   Continuous Infusions: . sodium chloride 100 mL/hr at 08/24/15 0043   PRN Meds:.LORazepam, loteprednol, metoprolol, morphine injection, ondansetron **OR** ondansetron (ZOFRAN) IV, sodium chloride Medications Prior to Admission:  Prior to Admission medications   Medication Sig Start Date End Date Taking? Authorizing Provider  acetaminophen (TYLENOL) 325  MG tablet Take 650 mg by mouth See admin instructions. Take 2 tablets (650 mg) 2 times daily, may take 2 more tablets mid-day as needed for pain   Yes Historical Provider, MD  aspirin EC 81 MG tablet Take 81 mg by mouth daily.   Yes Historical Provider, MD  digoxin (LANOXIN) 0.125 MG tablet Take 1 tablet (125 mcg total) by mouth daily. 02/05/15  Yes Burtis Junes, NP  loteprednol (LOTEMAX) 0.5 % ophthalmic suspension Place 1 drop into both eyes daily as needed (itching eyes).    Yes Historical Provider, MD  metoprolol succinate (TOPROL-XL) 25 MG 24 hr tablet TAKE 1 TABLET (25 MG TOTAL) BY MOUTH DAILY. TAKE WITH OR IMMEDIATELY FOLLOWING A MEAL. 04/10/15  Yes [name], MD  mineral oil enema Place 1 enema  rectally once.   Yes Historical Provider, MD  pantoprazole (PROTONIX) 40 MG tablet Take 1 tablet (40 mg total) by mouth daily. 08/21/15  Yes Theodis Blaze, MD  polyethylene glycol (MIRALAX / Floria Raveling) packet Take 17 g by mouth 2 (two) times daily. 08/21/15  Yes Marianne L York, PA-C  senna-docusate (SENOKOT-S) 8.6-50 MG tablet Take 1 tablet by mouth daily.   Yes Historical Provider, MD  tamsulosin (FLOMAX) 0.4 MG CAPS capsule Take 0.4 mg by mouth at bedtime.   Yes Historical Provider, MD   Allergies  Allergen Reactions  . Tramadol Other (See Comments)    Extreme sleepiness and irritable    Review of Systems Non verbal  Physical Exam Elderly weak frail gentleman resting in bed,  Essentially unresponsive, withdraws to verbal stimulus with a moan,  Has NGT draining dark bilious material Shallow breathing S1 S2 Abdomen distended No edema No coolness no mottling  Vital Signs: BP 115/50 mmHg  Pulse 92  Temp(Src) 97.9 F (36.6 C) (Axillary)  Resp 16  Ht '5\' 10"'$  (1.778 m)  Wt 61.2 kg (134 lb 14.7 oz)  BMI 19.36 kg/m2  SpO2 100%  SpO2: SpO2: 100 % O2 Device:SpO2: 100 % O2 Flow Rate: .   IO: Intake/output summary:  Intake/Output Summary (Last 24 hours) at 08/24/15 1554 Last data filed at 08/24/15 1300  Gross per 24 hour  Intake   3125 ml  Output     43 ml  Net   3082 ml    LBM: Last BM Date: 08/19/15 Baseline Weight: Weight: 61.2 kg (134 lb 14.7 oz) Most recent weight: Weight: 61.2 kg (134 lb 14.7 oz)      Palliative Assessment/Data:  Flowsheet Rows        Most Recent Value   Intake Tab    Referral Department  Hospitalist   Unit at Time of Referral  ICU   Palliative Care Primary Diagnosis  Sepsis/Infectious Disease   Palliative Care Type  New Palliative care   Reason for referral  Non-pain Symptom, End of Life Care Assistance   Date first seen by Palliative Care  08/24/15   Clinical Assessment    Palliative Performance Scale Score  10%   Pain Max last 24 hours  4    Pain Min Last 24 hours  3   Anxiety Max Last 24 Hours  4   Anxiety Min Last 24 Hours  3   Psychosocial & Spiritual Assessment    Palliative Care Outcomes    Patient/Family meeting held?  Yes   Who was at the meeting?  grand daughter in the room, daughter Dorian Heckle over the phone   Palliative Care follow-up planned  Yes, Facility  Additional Data Reviewed:  CBC:    Component Value Date/Time   WBC 57.5* 08/24/2015 0344   HGB 10.6* 08/24/2015 0344   HCT 32.1* 08/24/2015 0344   HCT QUANTITY NOT SUFFICIENT, UNABLE TO PERFORM TEST 08/19/2015 0355   PLT 93* 08/24/2015 0344   MCV 100.3* 08/24/2015 0344   NEUTROABS 7.4 08/23/2015 1319   LYMPHSABS 0.2* 08/23/2015 1319   MONOABS 0.0* 08/23/2015 1319   EOSABS 0.0 08/23/2015 1319   BASOSABS 0.0 08/23/2015 1319   Comprehensive Metabolic Panel:    Component Value Date/Time   NA 143 08/24/2015 0344   K 4.2 08/24/2015 0344   CL 109 08/24/2015 0344   CO2 20* 08/24/2015 0344   BUN 46* 08/24/2015 0344   CREATININE 2.18* 08/24/2015 0344   CREATININE 1.34 04/16/2014 1544   GLUCOSE 83 08/24/2015 0344   CALCIUM 7.7* 08/24/2015 0344   AST 60* 08/24/2015 0344   ALT 24 08/24/2015 0344   ALKPHOS 61 08/24/2015 0344   BILITOT 1.7* 08/24/2015 0344   PROT 4.8* 08/24/2015 0344   ALBUMIN 2.5* 08/24/2015 0344     Time In: 1500 Time Out: 1600 Time Total: 60 min  Greater than 50%  of this time was spent counseling and coordinating care related to the above assessment and plan.  Signed by: Loistine Chance, MD [id number] Loistine Chance, MD  08/24/2015, 4:05 PM  Please contact Palliative Medicine Team phone at [phone] for questions and concerns.

## 2015-08-24 NOTE — [place] Notes (Signed)
Pharmacy Antibiotic Follow-up Note  [name] is a 82 y.o. year-old male admitted on 08/23/2015.  The patient is currently on day 2 of vancomycin and Zosyn for r/o sepsis.  Assessment/Plan:  The dose ofantibiotics will be adjusted based on renal function.  Decrease to Zosyn 2.25 g IV q6h  Decrease to Vancomycin 1g IV q48h.  Measure Vanc trough at steady state.  Follow up renal fxn, culture results, and clinical course.    Temp (24hrs), Avg:98.3 F (36.8 C), Min:97.7 F (36.5 C), Max:99.5 F (37.5 C)   Recent Labs Lab 08/21/15 0515 08/23/15 0240 08/23/15 1319 08/23/15 1910 08/24/15 0344  WBC 10.0 19.8* 7.6 37.0* 57.5*    Recent Labs Lab 08/21/15 0515 08/23/15 0240 08/23/15 1319 08/23/15 1810 08/24/15 0344  CREATININE 1.40* 1.49* 1.37* 1.94* 2.18*   Estimated Creatinine Clearance: 19.1 mL/min (by C-G formula based on Cr of 2.18).    Allergies  Allergen Reactions  . Tramadol Other (See Comments)    Extreme sleepiness and irritable    Antimicrobials this admission: 1/21>>Zosyn>> 1/21>>Vanc>>  Levels/dose changes this admission: None  Microbiology Results: 1/21 BCx: canceled 1/21 UCx: sent 1/21 MRSA PCR: positive  Thank you for allowing pharmacy to be a part of this patient's care.  Gretta Arab PharmD, BCPS Pager [phone] 08/24/2015 12:09 PM

## 2015-08-24 NOTE — [place] Notes (Signed)
Subjective: Pt with no acute changes overnight   Objective: Vital signs in last 24 hours: Temp:  [97.7 F (36.5 C)-101.4 F (38.6 C)] 97.8 F (36.6 C) (01/22 0400) Pulse Rate:  [25-128] 113 (01/22 0600) Resp:  [13-36] 15 (01/22 0600) BP: (66-139)/(24-107) 94/52 mmHg (01/22 0600) SpO2:  [86 %-100 %] 100 % (01/22 0600) Last BM Date: 08/19/15  Intake/Output from previous day: 01/21 0701 - 01/22 0700 In: 2375 [I.V.:1265; NG/GT:400; IV Piggyback:210] Out: -  Intake/Output this shift:    General appearance: alert Cardio: tachy GI: soft, non ttp, no rebound/guarding  Lab Results:   Recent Labs  08/23/15 1910 08/24/15 0344  WBC 37.0* 57.5*  HGB 10.7* 10.6*  HCT 33.0* 32.1*  PLT 85* 93*   BMET  Recent Labs  08/23/15 1810 08/24/15 0344  NA 142 143  K 4.0 4.2  CL 108 109  CO2 21* 20*  GLUCOSE 68 83  BUN 40* 46*  CREATININE 1.94* 2.18*  CALCIUM 7.5* 7.7*   PT/INR  Recent Labs  08/23/15 0524 08/23/15 1319  LABPROT 17.6* 24.9*  INR 1.43 2.28*   ABG No results for input(s): PHART, HCO3 in the last 72 hours.  Invalid input(s): PCO2, PO2  Studies/Results: Ct Abdomen Pelvis Wo Contrast  08/23/2015  CLINICAL DATA:  Acute onset of generalized abdominal pain. Leukocytosis. Known small bowel obstruction. Initial encounter. EXAM: CT ABDOMEN AND PELVIS WITHOUT CONTRAST TECHNIQUE: Multidetector CT imaging of the abdomen and pelvis was performed following the standard protocol without IV contrast. COMPARISON:  CT the abdomen and pelvis performed 08/14/2015 FINDINGS: Trace bilateral pleural effusions are noted, with mild bibasilar atelectasis and a bleb at the left lung base. A small amount of ascites is noted tracking about the liver and within the pelvis, increased from the prior study. Mild fluid is seen tracking along the paracolic gutters bilaterally. The extent of small bowel dilatation has worsened from the prior study, now measuring up to 4.9 cm in diameter. The  stomach is also filled with fluid and air. There is worsened mesenteric edema at the right lower quadrant, with new engorgement of associated mesenteric nodes. On following the bowel, there appears to be a complex knot created by the terminal ileum, through which the mid to distal ileum extends. The mid to distal ileum obstructs as it passes through the knot. The terminal ileum also appears to twist on itself, with an apparent mesenteric band extending about the terminal ileal knot. This may reflect a complex configuration of small bowel volvulus. No typical internal hernia resides in this location, though internal herniation through a postoperative mesenteric defect might result in a similar appearance. The liver and spleen are unremarkable in appearance. The gallbladder is within normal limits. The pancreas and adrenal glands are unremarkable. A 1.8 cm cyst is noted at the interpole region of the left kidney. Mild left-sided perinephric fluid is noted. There is no evidence of hydronephrosis. No renal or ureteral stones are seen. No acute vascular abnormalities are seen. Scattered calcification is noted along the abdominal aorta and its branches. The appendix is normal in caliber, without evidence of appendicitis. A small amount of residual stool is noted within the colon. Scattered diverticulosis is noted along the entirety of the colon. The rectum contains only a small amount of stool. The bladder is moderately distended and partially filled with air. This may reflect recent Foley catheter placement. Would correlate clinically. The prostate is enlarged, measuring 5.5 cm in transverse dimension. No inguinal lymphadenopathy is seen. No acute  osseous abnormalities are identified. The patient is status post decompression at L5. Mild degenerative change is noted along the lower lumbar spine. IMPRESSION: 1. Interval worsening in small bowel dilatation, measuring up to 4.9 cm in diameter, reflecting worsening relatively  high-grade small bowel obstruction. Stomach also filled with fluid and air. Worsening mesenteric edema at the right lower quadrant, with new engorgement of associated mesenteric nodes. This appears to reflect obstruction due to a complex knot created by the terminal ileum, through which the mid to distal ileum extends. The terminal ileum also appears to twist on itself, with an apparent mesenteric band extending about the knot. This may reflect a complex configuration of small bowel volvulus, or possibly internal herniation through a postoperative mesenteric defect. 2. Small amount of ascites tracking about the liver and within the pelvis, increased from the prior study. Mild fluid tracks along the paracolic gutters bilaterally. 3. Bladder moderately distended and partially filled with air. This may reflect recent Foley catheter placement. Would correlate clinically. 4. Trace bilateral pleural effusions, with mild bibasilar atelectasis and a bleb at the left lung base. 5. Small left renal cyst noted. 6. Scattered calcification along the abdominal aorta and its branches. 7. Scattered diverticulosis along the entirety of the colon. The colon and rectum contain only a small amount of stool. Laxatives and enemas are not indicated, as the patient's symptoms reflect the underlying small bowel obstruction. 8. Enlarged prostate noted. These results were called by telephone at the time of interpretation on 08/23/2015 at 4:07 am to Dr. Delora Fuel, who verbally acknowledged these results. Electronically Signed   By: Garald Balding M.D.   On: 08/23/2015 04:23   Dg Chest 1 View  08/23/2015  CLINICAL DATA:  Sepsis.  History of AFib. EXAM: CHEST 1 VIEW COMPARISON:  01/29/2006 FINDINGS: Single lead cardiac pacemaker is stable. Enteric catheter is seen terminating within the gastric cardia. The cardiac silhouette is enlarged. Mediastinal contours appear intact. There is no evidence of pleural effusion or pneumothorax. There are  chronic interstitial lung changes without evidence of focal airspace consolidation. There is superior displacement of bilateral humeral heads in relation to the glenoids, left more than right, which may be seen chronic rotator cuff injury. Soft tissues are grossly normal. IMPRESSION: Enlarged cardiac silhouette without evidence of pulmonary edema. Chronic interstitial lung disease. Superior subluxation of bilateral femoral heads, left worse than right, probably chronic and related to rotator cuff injury. Correlation to clinical exam may be considered. Electronically Signed   By: Fidela Salisbury M.D.   On: 08/23/2015 17:28   Dg Abd Portable 1v-small Bowel Obstruction Protocol-initial, 8 Hr Delay  08/23/2015  CLINICAL DATA:  82 year old male with small-bowel obstruction. EXAM: PORTABLE ABDOMEN - 1 VIEW COMPARISON:  Radiograph dated 08/23/2015 FINDINGS: An enteric tube is noted in left upper abdomen in similar positioning. There are diffusely air distended loops of small bowel measuring up to 6 cm. An air distended structure in the upper abdomen is new from prior study and may represent an air distended stomach. There is moderate stool throughout the colon. No evidence of free air. Multiple gallstones noted. There is degenerative changes of the spine and osseous structures. IMPRESSION: Air distended loops of small bowel compatible with small bowel obstruction. Follow-up recommended. A air distention of the stomach, new from prior study. Electronically Signed   By: Anner Crete M.D.   On: 08/23/2015 23:11   Dg Abd Portable 1v-small Bowel Protocol-position Verification  08/23/2015  CLINICAL DATA:  Follow-up small bowel obstruction,  NG tube placement EXAM: PORTABLE ABDOMEN - 1 VIEW COMPARISON:  08/23/2015 FINDINGS: Again noted distended small bowel loops in mid upper abdomen and left abdomen. Largest loop measures up to 6.3 cm in diameter. There is NG-tube coiled within stomach with tip and proximal stomach.  IMPRESSION: Again noted gaseous distended small bowel loops within upper abdomen consistent with small bowel obstruction. NG tube coiled within stomach with tip in proximal stomach. Electronically Signed   By: Lahoma Crocker M.D.   On: 08/23/2015 14:07    Anti-infectives: Anti-infectives    Start     Dose/Rate Route Frequency Ordered Stop   08/24/15 1400  vancomycin (VANCOCIN) IVPB 750 mg/150 ml premix     750 mg 150 mL/hr over 60 Minutes Intravenous Every 24 hours 08/23/15 1417     08/23/15 2000  piperacillin-tazobactam (ZOSYN) IVPB 3.375 g     3.375 g 12.5 mL/hr over 240 Minutes Intravenous Every 8 hours 08/23/15 1311     08/23/15 1245  piperacillin-tazobactam (ZOSYN) IVPB 3.375 g     3.375 g 100 mL/hr over 30 Minutes Intravenous  Once 08/23/15 1235 08/23/15 1320   08/23/15 1245  vancomycin (VANCOCIN) IVPB 1000 mg/200 mL premix     1,000 mg 200 mL/hr over 60 Minutes Intravenous  Once 08/23/15 1235 08/23/15 1450      Assessment/Plan: 82 y/o M  SBO  Constipation Principal Problem:   SBO (small bowel obstruction) (HCC) Active Problems:   Atrial fibrillation (HCC)   AKI (acute kidney injury) ([place])   Abdominal pain, acute   Normocytic anemia   Volvulus (HCC)  -Pt abd softer this AM, no sign of peritonitis on exam.  Pt with large stool burden which may be contributing to ileus.  Pt may benefit from enema. -No escalation of care as per primary team  LOS: 1 day    Rosario Jacks., [facility] 08/24/2015

## 2015-08-24 NOTE — [place] Notes (Signed)
Chaplain visit the result of a [place] dated 24 Aug 2015. The consult was in reference to [name] being considered for Palliative Care.  [name] was not awake when chaplain visited. The woman sitting with him spoke of his being admitted earlier to the hospital and discharged before he was ready. She was aware of the Palliative Care consult and wanted to know the difference between Palliative Care and Hospice. Chaplain explained the functional difference in the two types of care and the similar aspects of the two. Family is looking forward to speaking with the Palliative Team. She underscored [name] strong will not to be put on machines to prolong his life. He has told his HCPoA, the woman's aunt, to insist on this. [name] was not expected to have survived the night. This family is watchful but resigned to this being his "final hospital stay."  Page Bonney Roussel if [name] or his family need and/or request further spiritual/emotional care.  Sallee Lange. Antionetta Ator, DMin, MDiv, MA Chaplain.

## 2015-08-24 NOTE — Progress Notes (Addendum)
Patient ID: [name], male   DOB: February 21, 1923, 82 y.o.   MRN: [id number]  [facility] PROGRESS NOTE  STIVEN KASPAR WUJ:[id number] DOB: 05/19/1923 DOA: 08/23/2015 PCP: Odette Fraction, MD   Brief narrative:    82 y.o. male with PMH of chronic atrial fibrillation no longer on anticoagulation, tachy-brady syndrome status post pacer placement, and dementia who presented from his nursing home with complaints of constipation and abdominal pain. Patient was just discharged from this institution on 08/19/2015 following management of an SBO. He was discharged in improved and stable condition with a clear liquid diet. Per report patient has had increased confusion since the recent hospitalization, increasing abdominal distention, abdominal pain, and has not moved his bowels in the last 4-5 days despite an aggressive bowel regimen at his SNF with enemas, senna, MiraLAX, and attempts at manual disimpaction.   Assessment/Plan:    Principal Problem:   Severe sepsis with acute organ dysfunction/Septic shock ([place]) - pt met criteria for sepsis on admission, source likely UTI, imposed on acute SBO, ? NSTEMI - sepsis protocol initiated, vancomycin and zosyn started - WBC still continue to trend up and likely from multiple acute etiologies outlined above - continue with IVF for now but also focus on ensuring comfort - this was discussed with granddaughter at bedside and daughter over the phone, critical nature of illness - family requested to ensure comfort but OK with continuing current measure - if pt displays symptoms of discomfort, family OK to transition to full comfort care  - keep in SDU  - DNR code status confirmed   Active Problems:   Abdominal pain, acute secondary to SBO (small bowel obstruction) ([place]) - with NGT in place, large amount of stool in colon - no signs of peritonitis on exam - family very clear that no agressive interventions desired  - provide analgesia as needed -  also give enema as recommended per surgery - if pt uncomfortable, OK to skip enema     NSTEMI (non-ST elevated myocardial infarction) ([place]) - peak troponin 0.58, trending down - pt is not candidate for further aggressive cardiac interventions - again, main focus is comfort and continuation of current measure     Acute renal failure ([place]) - secondary to acute illness outlined above - continues to worsen, on IVF for now - may repeat BMP in AM    Acute respiratory failure with hypoxia ([place]) - likely secondary to acute issues as well, ? Aspiration - CXR on admission with no acute findings but can not rule out aspiration - continue current ABX - keep on oxygen via [place] for now     Hypocalcemia - supplemented, improved    Atrial fibrillation ([place]) - CHADS 2 4-5 - pt determined not to be a candidate for [facility] due to high risk bleed     Thrombocytopenia (HCC) - ok to use DVT prophylaxis  - CBC in AM    Pacemaker-Medtronic - monitor     BPH (benign prostatic hyperplasia) - foley in place     Palliative care encounter - awaiting input - comfort will be provided     DNR (do not resuscitate)    Normocytic anemia - stable for now    Functional quadriplegia - from multiple acute and chronic issues - bed bound     Severe PCM - in the context of acute and progressive illness, recurrent SBO - high risk aspiration - keep NPO   DVT prophylaxis - SCD  Code Status: DNR Family Communication:  plan of care discussed with the granddaughter at bedside, daughter over the phone  Disposition Plan: No plans for d/c at this time, pt too sick   IV access:  Peripheral IV  Procedures and diagnostic studies:    Ct Abdomen Pelvis Wo Contrast 08/23/2015 Interval worsening in small bowel dilatation, measuring up to 4.9 cm in diameter, reflecting worsening relatively high-grade small bowel obstruction. Stomach also filled with fluid and air. Worsening mesenteric edema at the right lower  quadrant, with new engorgement of associated mesenteric nodes. This appears to reflect obstruction due to a complex knot created by the terminal ileum, through which the mid to distal ileum extends. The terminal ileum also appears to twist on itself, with an apparent mesenteric band extending about the knot. This may reflect a complex configuration of small bowel volvulus, or possibly internal herniation through a postoperative mesenteric defect. 2. Small amount of ascites tracking about the liver and within the pelvis, increased from the prior study. Mild fluid tracks along the paracolic gutters bilaterally. 3. Bladder moderately distended and partially filled with air. This may reflect recent Foley catheter placement. Would correlate clinically. 4. Trace bilateral pleural effusions, with mild bibasilar atelectasis and a bleb at the left lung base. 5. Small left renal cyst noted. 6. Scattered calcification along the abdominal aorta and its branches. 7. Scattered diverticulosis along the entirety of the colon. The colon and rectum contain only a small amount of stool. Laxatives and enemas are not indicated, as the patient's symptoms reflect the underlying small bowel obstruction. 8. Enlarged prostate noted.   Dg Chest 1 View 08/23/2015   Enlarged cardiac silhouette without evidence of pulmonary edema. Chronic interstitial lung disease. Superior subluxation of bilateral femoral heads, left worse than right, probably chronic and related to rotator cuff injury. Correlation to clinical exam may be considered.   Dg Abd Portable 1v-small Bowel Obstruction Protocol-initial, 8 Hr Delay 08/23/2015 Air distended loops of small bowel compatible with small bowel obstruction. Follow-up recommended. A air distention of the stomach, new from prior study.   Dg Abd Portable 1v-small Bowel Protocol-position Verification 08/23/2015  Again noted gaseous distended small bowel loops within upper abdomen consistent with small bowel  obstruction. NG tube coiled within stomach with tip in proximal stomach.   Medical Consultants:  Surgery  PCCM and Cardiology over the phone PCT  Other Consultants:  None  IAnti-Infectives:   Vancomycin 1/21 --> Zosyn 1/21 -->  Faye Ramsay, MD  [facility] Pager [phone]  If 7PM-7AM, please contact night-coverage [email or web address] Password [email or web address] 08/24/2015, 12:06 PM   LOS: 1 day   HPI/Subjective: No events overnight. Pt comfortable this AM.   Objective: Filed Vitals:   08/24/15 0700 08/24/15 0800 08/24/15 0900 08/24/15 1000  BP: 103/42 107/63 110/54 132/39  Pulse: 69 154  25  Temp:      TempSrc:      Resp: '19 18 15 24  '$ Height:  '5\' 10"'$  (1.778 m)    Weight:  61.2 kg (134 lb 14.7 oz)    SpO2: 100% 97%  86%    Intake/Output Summary (Last 24 hours) at 08/24/15 1206 Last data filed at 08/24/15 1143  Gross per 24 hour  Intake   2925 ml  Output     43 ml  Net   2882 ml    Exam:   General:  Pt is lethargic, NAD  Cardiovascular: Irregular rate and rhythm, no rubs, no gallops  Respiratory: Diminished breath sounds at bases with scattered rhonchi bilaterally   Abdomen:  Slightly tender in epigastric area, slightly distended, no guarding  Extremities: No edema, pulses DP and PT palpable bilaterally  Data Reviewed: Basic Metabolic Panel:  Recent Labs Lab 08/18/15 0415  08/20/15 0510 08/21/15 0515 08/23/15 0240 08/23/15 1319 08/23/15 1810 08/24/15 0344  NA 149*  < > 139 136 141 148* 142 143  K 3.8  < > 3.9 4.0 4.1 3.5 4.0 4.2  CL 113*  < > 104 104 104 103 108 109  CO2 27  < > '26 25 26 '$ 19* 21* 20*  GLUCOSE 101*  < > 110* 113* 76 101* 68 83  BUN 34*  < > 23* 26* 37* 32* 40* 46*  CREATININE 1.10  < > 1.13 1.40* 1.49* 1.37* 1.94* 2.18*  CALCIUM 8.7*  < > 8.2* 8.4* 8.5* 5.8* 7.5* 7.7*  MG 2.2  --  1.8  --   --   --   --  1.7  < > = values in this interval not displayed. Liver Function Tests:  Recent Labs Lab 08/23/15 0240 08/23/15 1319 08/23/15 1810  08/24/15 0344  AST 31 32 40 60*  ALT 18 15* 18 24  ALKPHOS 50 350* 101 61  BILITOT 1.5* 1.7* 1.9* 1.7*  PROT 5.8* 4.7* 4.9* 4.8*  ALBUMIN 3.1* 2.0* 2.5* 2.5*    Recent Labs Lab 08/23/15 0240  LIPASE 38   CBC:  Recent Labs Lab 08/21/15 0515 08/23/15 0240 08/23/15 1319 08/23/15 1910 08/24/15 0344  WBC 10.0 19.8* 7.6 37.0* 57.5*  NEUTROABS  --  18.6* 7.4  --   --   HGB 12.4* 12.0* 9.7* 10.7* 10.6*  HCT 38.3* 36.7* 30.0* 33.0* 32.1*  MCV 100.5* 99.7 99.0 99.7 100.3*  PLT 147* 142* 99* 85* 93*   Cardiac Enzymes:  Recent Labs Lab 08/23/15 1319 08/23/15 1810 08/23/15 2315 08/24/15 0344 08/24/15 0928  TROPONINI 0.11* 0.37* 0.58* 0.50* 0.46*   BNP: Invalid input(s): POCBNP CBG:  Recent Labs Lab 08/23/15 0814  GLUCAP 71    Recent Results (from the past 240 hour(s))  MRSA PCR Screening     Status: Abnormal   Collection Time: 08/23/15  7:29 PM  Result Value Ref Range Status   MRSA by PCR POSITIVE (A) NEGATIVE Final    Comment:        The GeneXpert MRSA Assay (FDA approved for NASAL specimens only), is one component of a comprehensive MRSA colonization surveillance program. It is not intended to diagnose MRSA infection nor to guide or monitor treatment for MRSA infections. RESULT CALLED TO, READ BACK BY AND VERIFIED WITH: J KAAT RN @ [phone] ON 08/23/15 BBY C DAVIS      Scheduled Meds: . Chlorhexidine Gluconate Cloth  6 each Topical Q0600  . digoxin  0.125 mg Intravenous Daily  . heparin  5,000 Units Subcutaneous 3 times per day  . mupirocin ointment  1 application Nasal BID  . piperacillin-tazobactam (ZOSYN)  IV  3.375 g Intravenous Q8H  . sodium chloride  3 mL Intravenous Q12H  . vancomycin  750 mg Intravenous Q24H   Continuous Infusions: . sodium chloride 100 mL/hr at 08/24/15 0043

## 2015-08-25 DIAGNOSIS — A4151 Sepsis due to Escherichia coli [E. coli]: Secondary | ICD-10-CM | POA: Diagnosis present

## 2015-08-25 DIAGNOSIS — Z7189 Other specified counseling: Secondary | ICD-10-CM

## 2015-08-25 LAB — CBC
HCT: 32.9 % — ABNORMAL LOW (ref 39.0–52.0)
HEMOGLOBIN: 10.6 g/dL — AB (ref 13.0–17.0)
MCH: 32.1 pg (ref 26.0–34.0)
MCHC: 32.2 g/dL (ref 30.0–36.0)
MCV: 99.7 fL (ref 78.0–100.0)
Platelets: 80 10*3/uL — ABNORMAL LOW (ref 150–400)
RBC: 3.3 MIL/uL — ABNORMAL LOW (ref 4.22–5.81)
RDW: 14.1 % (ref 11.5–15.5)
WBC: 29.6 10*3/uL — ABNORMAL HIGH (ref 4.0–10.5)

## 2015-08-25 LAB — BASIC METABOLIC PANEL
Anion gap: 11 (ref 5–15)
BUN: 53 mg/dL — AB (ref 6–20)
CHLORIDE: 113 mmol/L — AB (ref 101–111)
CO2: 21 mmol/L — AB (ref 22–32)
CREATININE: 1.9 mg/dL — AB (ref 0.61–1.24)
Calcium: 7.6 mg/dL — ABNORMAL LOW (ref 8.9–10.3)
GFR calc Af Amer: 34 mL/min — ABNORMAL LOW (ref >60)
GFR calc non Af Amer: 29 mL/min — ABNORMAL LOW (ref >60)
Glucose, Bld: 84 mg/dL (ref 65–99)
Potassium: 3.7 mmol/L (ref 3.5–5.1)
SODIUM: 145 mmol/L (ref 135–145)

## 2015-08-25 LAB — URINE CULTURE: Culture: >=100000

## 2015-08-25 LAB — GLUCOSE, CAPILLARY: GLUCOSE-CAPILLARY: 80 mg/dL (ref 65–99)

## 2015-08-25 MED ORDER — CEFAZOLIN SODIUM 1-5 GM-% IV SOLN
1.0000 g | Freq: Two times a day (BID) | INTRAVENOUS | Status: DC
  Administered 2015-08-25 – 2015-08-27 (×5): 1 g via INTRAVENOUS
  Filled 2015-08-25 (×5): qty 50

## 2015-08-25 MED ORDER — CALCIUM GLUCONATE 10 % IV SOLN
1.0000 g | Freq: Once | INTRAVENOUS | Status: AC
  Administered 2015-08-25: 1 g via INTRAVENOUS
  Filled 2015-08-25: qty 10

## 2015-08-25 MED ORDER — MORPHINE SULFATE (PF) 2 MG/ML IV SOLN
INTRAVENOUS | Status: AC
  Administered 2015-08-25: 2 mg via INTRAVENOUS
  Filled 2015-08-25: qty 1

## 2015-08-25 NOTE — Progress Notes (Addendum)
Patient ID: [name], male   DOB: 1923-02-18, 82 y.o.   MRN: [id number]  [facility] PROGRESS NOTE  [name] [id number]:[id number] DOB: [birth year] DOA: 08/23/2015 PCP: Odette Fraction, MD   Brief narrative:    82 y.o. male with PMH of chronic atrial fibrillation no longer on anticoagulation, tachy-brady syndrome status post pacer placement, and dementia who presented from his nursing home with complaints of constipation and abdominal pain. Patient was just discharged from this institution on 08/19/2015 following management of an SBO. He was discharged in improved and stable condition with a clear liquid diet. Per report patient has had increased confusion since the recent hospitalization, increasing abdominal distention, abdominal pain, and has not moved his bowels in the last 4-5 days despite an aggressive bowel regimen at his SNF with enemas, senna, MiraLAX, and attempts at manual disimpaction.   Assessment/Plan:    Principal Problem:   Severe sepsis with acute organ dysfunction/Septic shock ([place]) - pt met criteria for sepsis on admission, source likely UTI, urine culture with E. Coli - sepsis protocol initiated, vancomycin and zosyn started - blood cultures were not obtained on admission, not clear why but apparently per nursing staff pt to uncomfortable and family asked not to do it - WBC now trending down, will narrow down ABX to Ancef based on sensitivity report 1/23, if pt spikes fevers may need to obtain blood cultures unless transitioned to full comfort, so far no fevers, so no need to obtain blood cultures at this point as pt has already been on broad spectrum ABX - continue with IVF for now but also focus on ensuring comfort - this was discussed with granddaughter at bedside and daughter over the phone, critical nature of illness - family requested to ensure comfort but OK with continuing current measure - if pt displays symptoms of discomfort, family OK to transition  to full comfort care  - DNR code status confirmed  - we may be able to transfer to medical floor today after PCT meets with family   Active Problems:   Abdominal pain, acute secondary to SBO (small bowel obstruction) ([place]) - with NGT in place, large amount of stool in colon - no signs of peritonitis on exam, enema tried 1/22 but pt in discomfort and it did not help  - family very clear that no agressive interventions desired  - provide analgesia as needed    NSTEMI (non-ST elevated myocardial infarction) ([place]) - peak troponin 0.58, trending down - pt is not candidate for further aggressive cardiac interventions - again, main focus is comfort and continuation of current measure  - would think we can stop telemetry monitoring today after family meets with PCT     Acute renal failure ([place]) with metabolic acidosis  - secondary to acute illness outlined above - continue trending down with IVF  - may repeat BMP in AM    Acute respiratory failure with hypoxia ([place]) - likely secondary to acute issues as well, ? Aspiration - CXR on admission with no acute findings but can not rule out aspiration - continue ABX as noted above  - keep on oxygen via [place] for now     Hypocalcemia - supplement, BMP in AM    Atrial fibrillation (HCC) - CHADS 2 4-5 - pt determined not to be a candidate for [facility] due to high risk bleed  - as Plt are dropping, will stop SQ Heparin and will place on SCD's     Thrombocytopenia (HCC) - stop Heparin  SQ and place in SCD's  - CBC in AM    Pacemaker-Medtronic - monitor     BPH (benign prostatic hyperplasia) - foley in place     Palliative care encounter - awaiting input - comfort will be provided     DNR (do not resuscitate)    Normocytic anemia - stable for now    Functional quadriplegia - from multiple acute and chronic issues - bed bound     Severe PCM - in the context of acute and progressive illness, recurrent SBO - high risk aspiration - keep  NPO as pt remains lethargic   DVT prophylaxis - SCD  Code Status: DNR Family Communication:  plan of care discussed with the granddaughter at bedside, daughter over the phone  Disposition Plan: No plans for d/c at this time, pt too sick   IV access:  Peripheral IV  Procedures and diagnostic studies:    Ct Abdomen Pelvis Wo Contrast 08/23/2015 Interval worsening in small bowel dilatation, measuring up to 4.9 cm in diameter, reflecting worsening relatively high-grade small bowel obstruction. Stomach also filled with fluid and air. Worsening mesenteric edema at the right lower quadrant, with new engorgement of associated mesenteric nodes. This appears to reflect obstruction due to a complex knot created by the terminal ileum, through which the mid to distal ileum extends. The terminal ileum also appears to twist on itself, with an apparent mesenteric band extending about the knot. This may reflect a complex configuration of small bowel volvulus, or possibly internal herniation through a postoperative mesenteric defect. 2. Small amount of ascites tracking about the liver and within the pelvis, increased from the prior study. Mild fluid tracks along the paracolic gutters bilaterally. 3. Bladder moderately distended and partially filled with air. This may reflect recent Foley catheter placement. Would correlate clinically. 4. Trace bilateral pleural effusions, with mild bibasilar atelectasis and a bleb at the left lung base. 5. Small left renal cyst noted. 6. Scattered calcification along the abdominal aorta and its branches. 7. Scattered diverticulosis along the entirety of the colon. The colon and rectum contain only a small amount of stool. Laxatives and enemas are not indicated, as the patient's symptoms reflect the underlying small bowel obstruction. 8. Enlarged prostate noted.   Dg Chest 1 View 08/23/2015   Enlarged cardiac silhouette without evidence of pulmonary edema. Chronic interstitial lung  disease. Superior subluxation of bilateral femoral heads, left worse than right, probably chronic and related to rotator cuff injury. Correlation to clinical exam may be considered.   Dg Abd Portable 1v-small Bowel Obstruction Protocol-initial, 8 Hr Delay 08/23/2015 Air distended loops of small bowel compatible with small bowel obstruction. Follow-up recommended. A air distention of the stomach, new from prior study.   Dg Abd Portable 1v-small Bowel Protocol-position Verification 08/23/2015  Again noted gaseous distended small bowel loops within upper abdomen consistent with small bowel obstruction. NG tube coiled within stomach with tip in proximal stomach.   Medical Consultants:  Surgery  PCCM and Cardiology over the phone PCT  Other Consultants:  None  IAnti-Infectives:   Vancomycin 1/21 --> 1/23 Zosyn 1/21 --> 1/23 Ancef 1/23 -->  Faye Ramsay, MD  [facility] Pager [phone]  If 7PM-7AM, please contact night-coverage [email or web address] Password [facility] 08/25/2015, 10:39 AM   LOS: 2 days   HPI/Subjective: No events overnight. Pt comfortable this AM.   Objective: Filed Vitals:   08/25/15 0400 08/25/15 0500 08/25/15 0600 08/25/15 0800  BP: 126/72 102/56 102/54   Pulse: 75 111 116  Temp:    97.2 F (36.2 C)  TempSrc:      Resp: _0 Height:      Weight:      SpO2: 100% 100% 100%     Intake/Output Summary (Last 24 hours) at 08/25/15 1039 Last data filed at 08/25/15 0600  Gross per 24 hour  Intake   2150 ml  Output    536 ml  Net   1614 ml    Exam:   General:  Pt is lethargic, NAD  Cardiovascular: Irregular rate and rhythm, no rubs, no gallops  Respiratory: Diminished breath sounds at bases with scattered rhonchi bilaterally   Abdomen: Slightly tender in epigastric area, less distended, no guarding  Extremities: No edema, pulses DP and PT palpable bilaterally  Data Reviewed: Basic Metabolic Panel:  Recent Labs Lab 08/20/15 0510  08/23/15 0240  08/23/15 1319 08/23/15 1810 08/24/15 0344 08/25/15 0402  NA 139  < > 141 148* 142 143 145  K 3.9  < > 4.1 3.5 4.0 4.2 3.7  CL 104  < > 104 103 108 109 113*  CO2 26  < > 26 19* 21* 20* 21*  GLUCOSE 110*  < > 76 101* 68 83 84  BUN 23*  < > 37* 32* 40* 46* 53*  CREATININE 1.13  < > 1.49* 1.37* 1.94* 2.18* 1.90*  CALCIUM 8.2*  < > 8.5* 5.8* 7.5* 7.7* 7.6*  MG 1.8  --   --   --   --  1.7  --   < > = values in this interval not displayed. Liver Function Tests:  Recent Labs Lab 08/23/15 0240 08/23/15 1319 08/23/15 1810 08/24/15 0344  AST 31 32 40 60*  ALT 18 15* 18 24  ALKPHOS 50 350* 101 61  BILITOT 1.5* 1.7* 1.9* 1.7*  PROT 5.8* 4.7* 4.9* 4.8*  ALBUMIN 3.1* 2.0* 2.5* 2.5*    Recent Labs Lab 08/23/15 0240  LIPASE 38   CBC:  Recent Labs Lab 08/23/15 0240 08/23/15 1319 08/23/15 1910 08/24/15 0344 08/25/15 0402  WBC 19.8* 7.6 37.0* 57.5* 29.6*  NEUTROABS 18.6* 7.4  --   --   --   HGB 12.0* 9.7* 10.7* 10.6* 10.6*  HCT 36.7* 30.0* 33.0* 32.1* 32.9*  MCV 99.7 99.0 99.7 100.3* 99.7  PLT 142* 99* 85* 93* 80*   Cardiac Enzymes:  Recent Labs Lab 08/23/15 2315 08/24/15 0344 08/24/15 0928 08/24/15 1458 08/24/15 2128  TROPONINI 0.58* 0.50* 0.46* 0.36* 0.33*   CBG:  Recent Labs Lab 08/23/15 0814 08/24/15 0748 08/25/15 0820  GLUCAP 71 72 80    Recent Results (from the past 240 hour(s))  Urine culture     Status: None   Collection Time: 08/23/15  9:57 AM  Result Value Ref Range Status   Specimen Description URINE, CLEAN CATCH  Final   Special Requests NONE  Final   Culture   Final    >=100,000 COLONIES/mL ESCHERICHIA COLI Performed at [facility]    Report Status 08/25/2015 FINAL  Final   Organism ID, Bacteria ESCHERICHIA COLI  Final      Susceptibility   Escherichia coli - MIC*    AMPICILLIN <=2 SENSITIVE Sensitive     CEFAZOLIN <=4 SENSITIVE Sensitive     CEFTRIAXONE <=1 SENSITIVE Sensitive     CIPROFLOXACIN <=0.25 SENSITIVE Sensitive      GENTAMICIN <=1 SENSITIVE Sensitive     IMIPENEM <=0.25 SENSITIVE Sensitive     NITROFURANTOIN 32 SENSITIVE Sensitive  TRIMETH/SULFA <=20 SENSITIVE Sensitive     AMPICILLIN/SULBACTAM <=2 SENSITIVE Sensitive     PIP/TAZO <=4 SENSITIVE Sensitive     * >=100,000 COLONIES/mL ESCHERICHIA COLI  MRSA PCR Screening     Status: Abnormal   Collection Time: 08/23/15  7:29 PM  Result Value Ref Range Status   MRSA by PCR POSITIVE (A) NEGATIVE Final     Scheduled Meds: . Chlorhexidine Gluconate Cloth  6 each Topical Q0600  . digoxin  0.125 mg Intravenous Daily  . heparin  5,000 Units Subcutaneous 3 times per day  . mupirocin ointment  1 application Nasal BID  . piperacillin-tazobactam (ZOSYN)  IV  2.25 g Intravenous 4 times per day  . sodium chloride  3 mL Intravenous Q12H  . vancomycin  1,000 mg Intravenous Q48H   Continuous Infusions: . sodium chloride 100 mL/hr at 08/25/15 [phone]

## 2015-08-25 NOTE — Progress Notes (Signed)
Pharmacy Antibiotic Follow-up Note  [name] is a 82 y.o. year-old male admitted on 08/23/2015.  The patient is currently on day 3 of vancomycin and zosyn for rule out sepsis, UTI.  Assessment/Plan: After discussion with Dr. Doyle Askew, the current antibiotic(s) vancomycin and zosyn will be narrowed to cefazolin 1g IV q12h  and continued for based on clinical improvement days.  The stop date is entered and will be determined.  Temp (24hrs), Avg:97.1 F (36.2 C), Min:96.4 F (35.8 C), Max:97.9 F (36.6 C)   Recent Labs Lab 08/23/15 0240 08/23/15 1319 08/23/15 1910 08/24/15 0344 08/25/15 0402  WBC 19.8* 7.6 37.0* 57.5* 29.6*    Recent Labs Lab 08/23/15 0240 08/23/15 1319 08/23/15 1810 08/24/15 0344 08/25/15 0402  CREATININE 1.49* 1.37* 1.94* 2.18* 1.90*   Estimated Creatinine Clearance: 21.9 mL/min (by C-G formula based on Cr of 1.9).    Allergies  Allergen Reactions  . Tramadol Other (See Comments)    Extreme sleepiness and irritable    Antimicrobials this admission: 1/21>>Zosyn>> 1/23 1/21>>Vanc>> 1/23 1/23>> Cefazolin >>  Levels/dose changes this admission:  Microbiology Results: 1/21 BCx: canceled 1/21 UCx: >100k E.coli (pansensitive) 1/21 MRSA PCR: positive  Thank you for allowing pharmacy to be a part of this patient's care.  Peggyann Juba, PharmD, BCPS Pager: [phone]  08/25/2015 10:43 AM

## 2015-08-25 NOTE — Care Management Note (Signed)
Case Management Note  Patient Details  Name: [name] MRN: [id number] Date of Birth: 01/21/1923  Subjective/Objective:   Sepsis, sbo and hypovolemia                 Action/Plan:Date: August 25, 2015 Chart reviewed for concurrent status and case management needs. Will continue to follow patient for changes and needs: Velva Harman, BSN, CCM, RN,   [phone]   Expected Discharge Date:                  Expected Discharge Plan:  Home/Self Care  In-House Referral:  Clinical Social Work  Discharge planning Services  CM Consult  Post Acute Care Choice:  NA Choice offered to:  NA  DME Arranged:    DME Agency:     HH Arranged:    [place] Agency:     Status of Service:  In process, will continue to follow  Medicare Important Message Given:    Date Medicare IM Given:    Medicare IM give by:    Date Additional Medicare IM Given:    Additional Medicare Important Message give by:     If discussed at [place] of Stay Meetings, dates discussed:    Additional Comments:  Leeroy Cha, RN 08/25/2015, 11:46 AM

## 2015-08-25 NOTE — Progress Notes (Signed)
General Surgery [facility]  Discussed with Dr. Doyle Askew this AM.  Comfort measures planned.  No surgical intervention desired.  Will sign off.  Earnstine Regal, MD, [facility] Office: [phone]

## 2015-08-25 NOTE — Progress Notes (Addendum)
Daily Progress Note   Patient Name: [name]       Date: 08/25/2015 DOB: 1923-04-04  Age: 82 y.o. MRN#: [id number] Attending Physician: Theodis Blaze, MD Primary Care Physician: Odette Fraction, MD Admit Date: 08/23/2015  Reason for Consultation/Follow-up: Establishing goals of care  Subjective: I met with Mr. Rhew and daughter, Webb Silversmith, at bedside. Webb Silversmith is tearful as she admits to me that she does not feel like he is going to live long and that he would not want to live in the state he is in now. We discussed the goal to focus on comfort for him and this is PRIORITY - she does not want him to be in pain or suffer. We also discussed transition to more comfort care (i.e. moving to regular room, stopping tele/lab draws) but she seems to be struggling with these decisions for him - I did reiterate that I do not believe these interventions are going to change the outcome. Emotional support and therapeutic listening provided for [facility].    Length of Stay: 2 days  Current Medications: Scheduled Meds:  .  ceFAZolin (ANCEF) IV  1 g Intravenous Q12H  . Chlorhexidine Gluconate Cloth  6 each Topical Q0600  . digoxin  0.125 mg Intravenous Daily  . mupirocin ointment  1 application Nasal BID  . sodium chloride  3 mL Intravenous Q12H    Continuous Infusions: . sodium chloride 75 mL/hr at 08/25/15 1151    PRN Meds: LORazepam, loteprednol, metoprolol, morphine injection, ondansetron **OR** ondansetron (ZOFRAN) IV, sodium chloride  Physical Exam: Physical Exam  Constitutional: He appears well-developed. He appears lethargic.  Tense, appears uncomfortable  HENT:  NGT LIWS  Cardiovascular: Tachycardia present.   Pulmonary/Chest: Effort normal. No accessory muscle usage. No tachypnea. No  respiratory distress.  Abdominal: Soft. He exhibits distension.  Neurological: He appears lethargic. He is disoriented.                Vital Signs: BP 102/54 mmHg  Pulse 116  Temp(Src) 96.2 F (35.7 C) (Axillary)  Resp 18  Ht _0  (1.778 m)  Wt 61.2 kg (134 lb 14.7 oz)  BMI 19.36 kg/m2  SpO2 100% SpO2: SpO2: 100 % O2 Device: O2 Device: Nasal Cannula O2 Flow Rate: O2 Flow Rate (L/min): 3 L/min  Intake/output summary:  Intake/Output Summary (  Last 24 hours) at 08/25/15 1521 Last data filed at 08/25/15 0600  Gross per 24 hour  Intake   1600 ml  Output    493 ml  Net   1107 ml   LBM: Last BM Date: 08/19/15 Baseline Weight: Weight: 61.2 kg (134 lb 14.7 oz) Most recent weight: Weight: 61.2 kg (134 lb 14.7 oz)       Palliative Assessment/Data: Flowsheet Rows        Most Recent Value   Intake Tab    Referral Department  Hospitalist   Unit at Time of Referral  ICU   Palliative Care Primary Diagnosis  Sepsis/Infectious Disease   Palliative Care Type  New Palliative care   Reason for referral  Non-pain Symptom, End of Life Care Assistance   Date first seen by Palliative Care  08/24/15   Clinical Assessment    Palliative Performance Scale Score  10%   Pain Max last 24 hours  4   Pain Min Last 24 hours  3   Anxiety Max Last 24 Hours  4   Anxiety Min Last 24 Hours  3   Psychosocial & Spiritual Assessment    Palliative Care Outcomes    Patient/Family meeting held?  Yes   Who was at the meeting?  grand daughter in the room, daughter Dorian Heckle over the phone   Palliative Care follow-up planned  Yes, Facility      Additional Data Reviewed: CBC    Component Value Date/Time   WBC 29.6* 08/25/2015 0402   RBC 3.30* 08/25/2015 0402   HGB 10.6* 08/25/2015 0402   HCT 32.9* 08/25/2015 0402   HCT QUANTITY NOT SUFFICIENT, UNABLE TO PERFORM TEST 08/19/2015 0355   PLT 80* 08/25/2015 0402   MCV 99.7 08/25/2015 0402   MCH 32.1 08/25/2015 0402   MCHC 32.2 08/25/2015 0402   RDW  14.1 08/25/2015 0402   LYMPHSABS 0.2* 08/23/2015 1319   MONOABS 0.0* 08/23/2015 1319   EOSABS 0.0 08/23/2015 1319   BASOSABS 0.0 08/23/2015 1319    CMP     Component Value Date/Time   NA 145 08/25/2015 0402   K 3.7 08/25/2015 0402   CL 113* 08/25/2015 0402   CO2 21* 08/25/2015 0402   GLUCOSE 84 08/25/2015 0402   BUN 53* 08/25/2015 0402   CREATININE 1.90* 08/25/2015 0402   CREATININE 1.34 04/16/2014 1544   CALCIUM 7.6* 08/25/2015 0402   PROT 4.8* 08/24/2015 0344   ALBUMIN 2.5* 08/24/2015 0344   AST 60* 08/24/2015 0344   ALT 24 08/24/2015 0344   ALKPHOS 61 08/24/2015 0344   BILITOT 1.7* 08/24/2015 0344   GFRNONAA 29* 08/25/2015 0402   GFRNONAA 47* 04/16/2014 1544   GFRAA 34* 08/25/2015 0402   GFRAA 54* 04/16/2014 1544       Problem List:  Patient Active Problem List   Diagnosis Date Noted  . Sepsis due to Escherichia coli (E. coli) ([place]) 08/25/2015  . NSTEMI (non-ST elevated myocardial infarction) ([place]) 08/24/2015  . Acute renal failure ([place]) 08/24/2015  . Acute respiratory failure with hypoxia ([place]) 08/24/2015  . Hypocalcemia 08/24/2015  . Goals of care, counseling/discussion   . Normocytic anemia 08/23/2015  . Thrombocytopenia ([place])   . Palliative care encounter   . DNR (do not resuscitate)   . SBO (small bowel obstruction) ([place]) 08/14/2015  . BPH (benign prostatic hyperplasia)   . Abdominal pain, acute 08/28/2012  . Pacemaker-Medtronic 12/22/2010  . Atrial fibrillation ([place]) 08/28/2010     Palliative Care Assessment & Plan  1.Code Status:  DNR    Code Status Orders        Start     Ordered   08/23/15 0457  Do not attempt resuscitation (DNR)   Continuous    Question Answer Comment  In the event of cardiac or respiratory ARREST Do not call a "code blue"   In the event of cardiac or respiratory ARREST Do not perform Intubation, CPR, defibrillation or ACLS   In the event of cardiac or respiratory ARREST Use medication by any route, position,  wound care, and other measures to relive pain and suffering. May use oxygen, suction and manual treatment of airway obstruction as needed for comfort.      08/23/15 0502    Code Status History    Date Active Date Inactive Code Status Order ID Comments User Context   08/15/2015  1:38 AM 08/21/2015  5:10 PM DNR [id number]  Lily Kocher, MD Inpatient   08/07/2013 12:56 PM 08/07/2013  5:19 PM Full Code [id number]  [name], MD Inpatient   08/29/2012 12:00 AM 09/02/2012  4:22 PM Full Code [id number]  Kaylyn Lim, RN Inpatient    Advance Directive Documentation        Most Recent Value   Type of Advance Directive  Out of facility DNR (pink MOST or yellow form), Living will   Pre-existing out of facility DNR order (yellow form or pink MOST form)     "MOST" Form in Place?         2. Goals of Care/Additional Recommendations:  Comfort is priority and eliminating suffering. Recommend transition to more comfort based care - ok to continue NGT for comfort with small bowel obstruction and antibiotics if family wishes. Will need further discussion with family.   Desire for further Chaplaincy support:yes  Psycho-social Needs: Caregiving  Support/Resources  3. Symptom Management:      1. Pain: Continue morphine 2 mg every 2 hours prn.       2. Anxiety/agitation: Lorazepam 1-2 mg every 4 hours.   4. Palliative Prophylaxis:   Frequent Pain Assessment and Oral Care  5. Prognosis: Hours - Days  6. Discharge Planning:  Anticipated Hospital Death    Thank you for allowing the Palliative Medicine Team to assist in the care of this patient.   Time In: 1430 Time Out: 1500 Total Time 64mn Prolonged Time Billed  no         APershing Proud NP  11/95/0932 3:21 PM  Please contact Palliative Medicine Team phone at [phone] questions and concerns.

## 2015-08-26 DIAGNOSIS — A4151 Sepsis due to Escherichia coli [E. coli]: Principal | ICD-10-CM

## 2015-08-26 LAB — BASIC METABOLIC PANEL
ANION GAP: 11 (ref 5–15)
BUN: 49 mg/dL — ABNORMAL HIGH (ref 6–20)
CALCIUM: 8.2 mg/dL — AB (ref 8.9–10.3)
CHLORIDE: 116 mmol/L — AB (ref 101–111)
CO2: 22 mmol/L (ref 22–32)
Creatinine, Ser: 1.69 mg/dL — ABNORMAL HIGH (ref 0.61–1.24)
GFR calc Af Amer: 39 mL/min — ABNORMAL LOW (ref >60)
GFR calc non Af Amer: 34 mL/min — ABNORMAL LOW (ref >60)
GLUCOSE: 74 mg/dL (ref 65–99)
POTASSIUM: 3.4 mmol/L — AB (ref 3.5–5.1)
Sodium: 149 mmol/L — ABNORMAL HIGH (ref 135–145)

## 2015-08-26 LAB — GLUCOSE, CAPILLARY
GLUCOSE-CAPILLARY: 104 mg/dL — AB (ref 65–99)
GLUCOSE-CAPILLARY: 71 mg/dL (ref 65–99)
Glucose-Capillary: 59 mg/dL — ABNORMAL LOW (ref 65–99)
Glucose-Capillary: 64 mg/dL — ABNORMAL LOW (ref 65–99)

## 2015-08-26 LAB — CBC
HEMATOCRIT: 35.4 % — AB (ref 39.0–52.0)
HEMOGLOBIN: 11.1 g/dL — AB (ref 13.0–17.0)
MCH: 32.1 pg (ref 26.0–34.0)
MCHC: 31.4 g/dL (ref 30.0–36.0)
MCV: 102.3 fL — AB (ref 78.0–100.0)
Platelets: 78 10*3/uL — ABNORMAL LOW (ref 150–400)
RBC: 3.46 MIL/uL — ABNORMAL LOW (ref 4.22–5.81)
RDW: 14.3 % (ref 11.5–15.5)
WBC: 9.3 10*3/uL (ref 4.0–10.5)

## 2015-08-26 LAB — TROPONIN I: Troponin I: 0.24 ng/mL — ABNORMAL HIGH (ref <0.031)

## 2015-08-26 MED ORDER — DEXTROSE 50 % IV SOLN
INTRAVENOUS | Status: AC
  Administered 2015-08-26: 50 mL via INTRAVENOUS
  Filled 2015-08-26: qty 50

## 2015-08-26 MED ORDER — DEXTROSE 50 % IV SOLN
1.0000 | Freq: Once | INTRAVENOUS | Status: AC
  Administered 2015-08-26: 50 mL via INTRAVENOUS

## 2015-08-26 MED ORDER — SCOPOLAMINE 1 MG/3DAYS TD PT72
1.0000 | MEDICATED_PATCH | TRANSDERMAL | Status: DC
  Filled 2015-08-26: qty 1

## 2015-08-26 MED ORDER — CHLORHEXIDINE GLUCONATE 0.12 % MT SOLN
15.0000 mL | Freq: Two times a day (BID) | OROMUCOSAL | Status: DC
  Administered 2015-08-26 – 2015-08-29 (×6): 15 mL via OROMUCOSAL
  Filled 2015-08-26 (×4): qty 15

## 2015-08-26 MED ORDER — DEXTROSE 50 % IV SOLN
INTRAVENOUS | Status: AC
  Filled 2015-08-26: qty 50

## 2015-08-26 MED ORDER — DEXTROSE 50 % IV SOLN
50.0000 mL | INTRAVENOUS | Status: AC
  Administered 2015-08-26: 50 mL via INTRAVENOUS

## 2015-08-26 MED ORDER — CETYLPYRIDINIUM CHLORIDE 0.05 % MT LIQD
7.0000 mL | Freq: Two times a day (BID) | OROMUCOSAL | Status: DC
  Administered 2015-08-26 – 2015-08-29 (×5): 7 mL via OROMUCOSAL

## 2015-08-26 MED ORDER — SCOPOLAMINE 1 MG/3DAYS TD PT72
1.0000 | MEDICATED_PATCH | TRANSDERMAL | Status: DC | PRN
  Filled 2015-08-26: qty 1

## 2015-08-26 NOTE — Progress Notes (Signed)
Hypoglycemic Event  CBG: 59 @ 0744  Treatment: 1 amp D50  Symptoms:  none  Follow-up CBG: Time: 0810 CBG Result: 104  Possible Reasons for Event: NPO  Comments/MD notified: Dr. Elige Ko, Amado Nash

## 2015-08-26 NOTE — Progress Notes (Signed)
Notified MD that the patient's daughter requested vital signs be stopped due to possible discomfort to the patient.  Palliative care visited the patient and daughter today.

## 2015-08-26 NOTE — Progress Notes (Signed)
Hypoglycemic Event  CBG: 69  Treatment: D50 IV 50 mL  Symptoms: None  Follow-up CBG: Time: 23:47  Possible Reasons for Event: Inadequate meal intake  Comments/MD notified:Protocol utilized    Jaksen Fiorella D

## 2015-08-26 NOTE — Progress Notes (Signed)
Nutrition Brief Note  Chart reviewed.  Spoke with Pt's daughter at bedside. Pt was eating ok prior to admission but is suffering from severe AMS. Per Palliative Care NP, patient is transitioning to comfort care at this time. Spoke with Dr. Doyle Askew, this is the goal of care. She stated they will finish this course of antibiotics and will provide no further interventions.   No further nutrition interventions warranted at this time.  Please re-consult as needed.   Satira Anis. Shamell Hittle, MS, RD LDN After Hours/Weekend Pager [phone]

## 2015-08-26 NOTE — Progress Notes (Signed)
Patient ID: [name], male   DOB: 12-29-1922, 82 y.o.   MRN: [id number]  [facility] PROGRESS NOTE  [name] QMG:[id number] DOB: 1922/08/16 DOA: 08/23/2015 PCP: Odette Fraction, MD   Brief narrative:    82 y.o. male with PMH of chronic atrial fibrillation no longer on anticoagulation, tachy-brady syndrome status post pacer placement, and dementia who presented from his nursing home with complaints of constipation and abdominal pain. Patient was just discharged from this institution on 08/19/2015 following management of an SBO. He was discharged in improved and stable condition with a clear liquid diet. Per report patient has had increased confusion since the recent hospitalization, increasing abdominal distention, abdominal pain, and has not moved his bowels in the last 4-5 days despite an aggressive bowel regimen at his SNF with enemas, senna, MiraLAX, and attempts at manual disimpaction.   Assessment/Plan:    Principal Problem:   Severe sepsis with acute organ dysfunction/Septic shock (HCC) - pt met criteria for sepsis on admission, source likely UTI, urine culture with E. Coli - sepsis protocol initiated, vancomycin and zosyn started - blood cultures were not obtained on admission, not clear why but apparently per nursing staff pt to uncomfortable and family asked not to do it - WBC now trending down, on 1/23 narrowed down ABX to Ancef based on sensitivity report - continue with focus on ensuring comfort - this was discussed with granddaughter at bedside and daughter over the phone - family requested to ensure comfort but OK with continuing current measure - family OK to transition to full comfort care, plan on completing ABX course for UTI - DNR code status confirmed  - plan to transfer to medical floor today after PCT meets with family   Active Problems:   Abdominal pain, acute secondary to SBO (small bowel obstruction) ([place]) - with NGT in place, large amount  of stool in colon - no signs of peritonitis on exam, enema tried 1/22 but pt in discomfort and it did not help  - family very clear that no agressive interventions desired  - provide analgesia as needed, transfer to palliative care unit     NSTEMI (non-ST elevated myocardial infarction) ([place]) - peak troponin 0.58, trending down - pt is not candidate for further aggressive cardiac interventions - again, main focus is comfort and continuation of current measure  - d/c tele     Acute renal failure ([place]) with metabolic acidosis  - secondary to acute illness outlined above - continue trending down     Hypernatremia - from no oral intake - family understands guarded prognosis     Acute respiratory failure with hypoxia ([place]) - likely secondary to acute issues as well, ? Aspiration - CXR on admission with no acute findings but can not rule out aspiration - continue ABX as noted above  - keep on oxygen via [place] for now     Hypocalcemia - supplemented     Atrial fibrillation ([place]) - CHADS 2 4-5 - pt determined not to be a candidate for [facility] due to high risk bleed     Thrombocytopenia (HCC) - stopped Heparin SQ and place in SCD's     Pacemaker-Medtronic - monitor     BPH (benign prostatic hyperplasia) - foley in place, can remove to ensure comfort if family desires     Palliative care encounter - comfort will be provided  - consider [facility] placement     DNR (do not resuscitate)    Normocytic anemia - stable for  now    Functional quadriplegia - from multiple acute and chronic issues - bed bound     Severe PCM - in the context of acute and progressive illness, recurrent SBO - scopolamine patch to control secretions   DVT prophylaxis - SCD  Code Status: DNR Family Communication:  plan of care discussed with the granddaughter at bedside, daughter over the phone  Disposition Plan: [facility]   IV access:  Peripheral IV  Procedures and diagnostic studies:    Ct  Abdomen Pelvis Wo Contrast 08/23/2015 Interval worsening in small bowel dilatation, measuring up to 4.9 cm in diameter, reflecting worsening relatively high-grade small bowel obstruction. Stomach also filled with fluid and air. Worsening mesenteric edema at the right lower quadrant, with new engorgement of associated mesenteric nodes. This appears to reflect obstruction due to a complex knot created by the terminal ileum, through which the mid to distal ileum extends. The terminal ileum also appears to twist on itself, with an apparent mesenteric band extending about the knot. This may reflect a complex configuration of small bowel volvulus, or possibly internal herniation through a postoperative mesenteric defect. 2. Small amount of ascites tracking about the liver and within the pelvis, increased from the prior study. Mild fluid tracks along the paracolic gutters bilaterally. 3. Bladder moderately distended and partially filled with air. This may reflect recent Foley catheter placement. Would correlate clinically. 4. Trace bilateral pleural effusions, with mild bibasilar atelectasis and a bleb at the left lung base. 5. Small left renal cyst noted. 6. Scattered calcification along the abdominal aorta and its branches. 7. Scattered diverticulosis along the entirety of the colon. The colon and rectum contain only a small amount of stool. Laxatives and enemas are not indicated, as the patient's symptoms reflect the underlying small bowel obstruction. 8. Enlarged prostate noted.   Dg Chest 1 View 08/23/2015   Enlarged cardiac silhouette without evidence of pulmonary edema. Chronic interstitial lung disease. Superior subluxation of bilateral femoral heads, left worse than right, probably chronic and related to rotator cuff injury. Correlation to clinical exam may be considered.   Dg Abd Portable 1v-small Bowel Obstruction Protocol-initial, 8 Hr Delay 08/23/2015 Air distended loops of small bowel compatible with  small bowel obstruction. Follow-up recommended. A air distention of the stomach, new from prior study.   Dg Abd Portable 1v-small Bowel Protocol-position Verification 08/23/2015  Again noted gaseous distended small bowel loops within upper abdomen consistent with small bowel obstruction. NG tube coiled within stomach with tip in proximal stomach.   Medical Consultants:  Surgery  PCCM and Cardiology over the phone PCT  Other Consultants:  None  IAnti-Infectives:   Vancomycin 1/21 --> 1/23 Zosyn 1/21 --> 1/23 Ancef 1/23 -->  Faye Ramsay, MD  [facility] Pager [phone]  If 7PM-7AM, please contact night-coverage [email or web address] Password [facility] 08/26/2015, 1:49 PM   LOS: 3 days   HPI/Subjective: No events overnight. Pt comfortable this AM.   Objective: Filed Vitals:   08/26/15 1000 08/26/15 1149 08/26/15 1159 08/26/15 1200  BP: 143/94     Pulse: 100 108 109   Temp:    97.5 F (36.4 C)  TempSrc:    Oral  Resp: _0 Height:      Weight:      SpO2: 100% 99% 100%     Intake/Output Summary (Last 24 hours) at 08/26/15 1349 Last data filed at 08/26/15 1100  Gross per 24 hour  Intake    250 ml  Output  300 ml  Net    -50 ml    Exam:   General:  Pt is lethargic, NAD  Cardiovascular: Irregular rate and rhythm, no rubs, no gallops  Respiratory: Diminished breath sounds at bases with scattered rhonchi bilaterally   Abdomen: Slightly tender in epigastric area, less distended, no guarding  Data Reviewed: Basic Metabolic Panel:  Recent Labs Lab 08/20/15 0510  08/23/15 1319 08/23/15 1810 08/24/15 0344 08/25/15 0402 08/26/15 0333  NA 139  < > 148* 142 143 145 149*  K 3.9  < > 3.5 4.0 4.2 3.7 3.4*  CL 104  < > 103 108 109 113* 116*  CO2 26  < > 19* 21* 20* 21* 22  GLUCOSE 110*  < > 101* 68 83 84 74  BUN 23*  < > 32* 40* 46* 53* 49*  CREATININE 1.13  < > 1.37* 1.94* 2.18* 1.90* 1.69*  CALCIUM 8.2*  < > 5.8* 7.5* 7.7* 7.6* 8.2*  MG 1.8  --   --   --  1.7   --   --   < > = values in this interval not displayed. Liver Function Tests:  Recent Labs Lab 08/23/15 0240 08/23/15 1319 08/23/15 1810 08/24/15 0344  AST 31 32 40 60*  ALT 18 15* 18 24  ALKPHOS 50 350* 101 61  BILITOT 1.5* 1.7* 1.9* 1.7*  PROT 5.8* 4.7* 4.9* 4.8*  ALBUMIN 3.1* 2.0* 2.5* 2.5*    Recent Labs Lab 08/23/15 0240  LIPASE 38   CBC:  Recent Labs Lab 08/23/15 0240 08/23/15 1319 08/23/15 1910 08/24/15 0344 08/25/15 0402 08/26/15 0333  WBC 19.8* 7.6 37.0* 57.5* 29.6* 9.3  NEUTROABS 18.6* 7.4  --   --   --   --   HGB 12.0* 9.7* 10.7* 10.6* 10.6* 11.1*  HCT 36.7* 30.0* 33.0* 32.1* 32.9* 35.4*  MCV 99.7 99.0 99.7 100.3* 99.7 102.3*  PLT 142* 99* 85* 93* 80* 78*   Cardiac Enzymes:  Recent Labs Lab 08/24/15 0344 08/24/15 0928 08/24/15 1458 08/24/15 2128 08/26/15 0333  TROPONINI 0.50* 0.46* 0.36* 0.33* 0.24*   CBG:  Recent Labs Lab 08/23/15 0814 08/24/15 0748 08/25/15 0820 08/26/15 0744 08/26/15 0810  GLUCAP 71 72 80 59* 104*    Recent Results (from the past 240 hour(s))  Urine culture     Status: None   Collection Time: 08/23/15  9:57 AM  Result Value Ref Range Status   Specimen Description URINE, CLEAN CATCH  Final   Special Requests NONE  Final   Culture   Final    >=100,000 COLONIES/mL ESCHERICHIA COLI Performed at [facility]    Report Status 08/25/2015 FINAL  Final   Organism ID, Bacteria ESCHERICHIA COLI  Final      Susceptibility   Escherichia coli - MIC*    AMPICILLIN <=2 SENSITIVE Sensitive     CEFAZOLIN <=4 SENSITIVE Sensitive     CEFTRIAXONE <=1 SENSITIVE Sensitive     CIPROFLOXACIN <=0.25 SENSITIVE Sensitive     GENTAMICIN <=1 SENSITIVE Sensitive     IMIPENEM <=0.25 SENSITIVE Sensitive     NITROFURANTOIN 32 SENSITIVE Sensitive     TRIMETH/SULFA <=20 SENSITIVE Sensitive     AMPICILLIN/SULBACTAM <=2 SENSITIVE Sensitive     PIP/TAZO <=4 SENSITIVE Sensitive     * >=100,000 COLONIES/mL ESCHERICHIA COLI  MRSA PCR  Screening     Status: Abnormal   Collection Time: 08/23/15  7:29 PM  Result Value Ref Range Status   MRSA by PCR POSITIVE (A) NEGATIVE Final  Scheduled Meds: . antiseptic oral rinse  7 mL Mouth Rinse q12n4p  .  ceFAZolin (ANCEF) IV  1 g Intravenous Q12H  . chlorhexidine  15 mL Mouth Rinse BID  . Chlorhexidine Gluconate Cloth  6 each Topical Q0600  . digoxin  0.125 mg Intravenous Daily  . mupirocin ointment  1 application Nasal BID  . sodium chloride  3 mL Intravenous Q12H   Continuous Infusions:

## 2015-08-26 NOTE — Progress Notes (Signed)
Daily Progress Note   Patient Name: [name]       Date: 08/26/2015 DOB: 18-May-1923  Age: 82 y.o. MRN#: [id number] Attending Physician: Theodis Blaze, MD Primary Care Physician: Odette Fraction, MD Admit Date: 08/23/2015  Reason for Consultation/Follow-up: Establishing goals of care  Subjective: I met again today with Mr. Barnaby and Lelon Frohlich. Mr. Kean appears to be more comfortable today I am happy to see. Lelon Frohlich seems to have had some time to let his prognosis sink in a little more and agrees to continue NGT for comfort and complete antibiotics but otherwise comfort care as his SBO is not improving. Lelon Frohlich also agrees to stop lab draws as this will not change his overall outcome. She is nervous about transfer to floor but also talks about transition to hospice facility which I reassure her will be a good plan for him. I know that they will ensure his comfort. She is also asking very appropriate questions about funeral arrangements. Emotional support provided. All questions/concerns addressed.    Length of Stay: 3 days  Current Medications: Scheduled Meds:  . antiseptic oral rinse  7 mL Mouth Rinse q12n4p  .  ceFAZolin (ANCEF) IV  1 g Intravenous Q12H  . chlorhexidine  15 mL Mouth Rinse BID  . Chlorhexidine Gluconate Cloth  6 each Topical Q0600  . digoxin  0.125 mg Intravenous Daily  . mupirocin ointment  1 application Nasal BID  . scopolamine  1 patch Transdermal Q72H  . sodium chloride  3 mL Intravenous Q12H    Continuous Infusions:    PRN Meds: LORazepam, loteprednol, metoprolol, morphine injection, ondansetron **OR** ondansetron (ZOFRAN) IV, sodium chloride  Physical Exam: Physical Exam  Constitutional: He appears well-developed. He appears lethargic.  Tense, appears  uncomfortable  HENT:  NGT LIWS  Cardiovascular: Tachycardia present.   Pulmonary/Chest: Effort normal. No accessory muscle usage. No tachypnea. No respiratory distress.  Abdominal: Soft. He exhibits distension.  Neurological: He appears lethargic. He is disoriented.                Vital Signs: BP 143/94 mmHg  Pulse 109  Temp(Src) 97.5 F (36.4 C) (Oral)  Resp 16  Ht _0  (1.778 m)  Wt 61.2 kg (134 lb 14.7 oz)  BMI 19.36 kg/m2  SpO2 100% SpO2: SpO2: 100 % O2  Device: O2 Device: Nasal Cannula O2 Flow Rate: O2 Flow Rate (L/min): 6 L/min  Intake/output summary:   Intake/Output Summary (Last 24 hours) at 08/26/15 1449 Last data filed at 08/26/15 1100  Gross per 24 hour  Intake    175 ml  Output    300 ml  Net   -125 ml   LBM: Last BM Date: 08/19/15 Baseline Weight: Weight: 61.2 kg (134 lb 14.7 oz) Most recent weight: Weight: 61.2 kg (134 lb 14.7 oz)       Palliative Assessment/Data: Flowsheet Rows        Most Recent Value   Intake Tab    Referral Department  Hospitalist   Unit at Time of Referral  ICU   Palliative Care Primary Diagnosis  Sepsis/Infectious Disease   Palliative Care Type  New Palliative care   Reason for referral  Non-pain Symptom, End of Life Care Assistance   Date first seen by Palliative Care  08/24/15   Clinical Assessment    Palliative Performance Scale Score  10%   Pain Max last 24 hours  4   Pain Min Last 24 hours  3   Anxiety Max Last 24 Hours  4   Anxiety Min Last 24 Hours  3   Psychosocial & Spiritual Assessment    Palliative Care Outcomes    Patient/Family meeting held?  Yes   Who was at the meeting?  grand daughter in the room, daughter Dorian Heckle over the phone   Palliative Care follow-up planned  Yes, Facility      Additional Data Reviewed: CBC    Component Value Date/Time   WBC 9.3 08/26/2015 0333   RBC 3.46* 08/26/2015 0333   HGB 11.1* 08/26/2015 0333   HCT 35.4* 08/26/2015 0333   HCT QUANTITY NOT SUFFICIENT, UNABLE TO  PERFORM TEST 08/19/2015 0355   PLT 78* 08/26/2015 0333   MCV 102.3* 08/26/2015 0333   MCH 32.1 08/26/2015 0333   MCHC 31.4 08/26/2015 0333   RDW 14.3 08/26/2015 0333   LYMPHSABS 0.2* 08/23/2015 1319   MONOABS 0.0* 08/23/2015 1319   EOSABS 0.0 08/23/2015 1319   BASOSABS 0.0 08/23/2015 1319    CMP     Component Value Date/Time   NA 149* 08/26/2015 0333   K 3.4* 08/26/2015 0333   CL 116* 08/26/2015 0333   CO2 22 08/26/2015 0333   GLUCOSE 74 08/26/2015 0333   BUN 49* 08/26/2015 0333   CREATININE 1.69* 08/26/2015 0333   CREATININE 1.34 04/16/2014 1544   CALCIUM 8.2* 08/26/2015 0333   PROT 4.8* 08/24/2015 0344   ALBUMIN 2.5* 08/24/2015 0344   AST 60* 08/24/2015 0344   ALT 24 08/24/2015 0344   ALKPHOS 61 08/24/2015 0344   BILITOT 1.7* 08/24/2015 0344   GFRNONAA 34* 08/26/2015 0333   GFRNONAA 47* 04/16/2014 1544   GFRAA 39* 08/26/2015 0333   GFRAA 54* 04/16/2014 1544       Problem List:  Patient Active Problem List   Diagnosis Date Noted  . Sepsis due to Escherichia coli (E. coli) ([place]) 08/25/2015  . NSTEMI (non-ST elevated myocardial infarction) ([place]) 08/24/2015  . Acute renal failure ([place]) 08/24/2015  . Acute respiratory failure with hypoxia ([place]) 08/24/2015  . Hypocalcemia 08/24/2015  . Goals of care, counseling/discussion   . Normocytic anemia 08/23/2015  . Thrombocytopenia ([place])   . Palliative care encounter   . DNR (do not resuscitate)   . SBO (small bowel obstruction) ([place]) 08/14/2015  . BPH (benign prostatic hyperplasia)   . Abdominal pain,  acute 08/28/2012  . Pacemaker-Medtronic 12/22/2010  . Atrial fibrillation ([place]) 08/28/2010     Palliative Care Assessment & Plan    1.Code Status:  DNR    Code Status Orders        Start     Ordered   08/23/15 0457  Do not attempt resuscitation (DNR)   Continuous    Question Answer Comment  In the event of cardiac or respiratory ARREST Do not call a "code blue"   In the event of cardiac or respiratory  ARREST Do not perform Intubation, CPR, defibrillation or ACLS   In the event of cardiac or respiratory ARREST Use medication by any route, position, wound care, and other measures to relive pain and suffering. May use oxygen, suction and manual treatment of airway obstruction as needed for comfort.      08/23/15 0502    Code Status History    Date Active Date Inactive Code Status Order ID Comments User Context   08/15/2015  1:38 AM 08/21/2015  5:10 PM DNR [id number]  Lily Kocher, MD Inpatient   08/07/2013 12:56 PM 08/07/2013  5:19 PM Full Code [id number]  [name], MD Inpatient   08/29/2012 12:00 AM 09/02/2012  4:22 PM Full Code [id number]  Kaylyn Lim, RN Inpatient    Advance Directive Documentation        Most Recent Value   Type of Advance Directive  Out of facility DNR (pink MOST or yellow form), Living will   Pre-existing out of facility DNR order (yellow form or pink MOST form)     "MOST" Form in Place?         2. Goals of Care/Additional Recommendations:  Continue antibiotics and NGT but otherwise full comfort care.   Desire for further Chaplaincy support:yes  Psycho-social Needs: Caregiving  Support/Resources  3. Symptom Management:      1. Pain: Continue morphine 2 mg every 2 hours prn.       2. Anxiety/agitation: Lorazepam 1-2 mg every 4 hours.   4. Palliative Prophylaxis:   Frequent Pain Assessment and Oral Care  5. Prognosis: Hours - Days  6. Discharge Planning:  Anticipated Hospital Death vs hospice facility.     Thank you for allowing the Palliative Medicine Team to assist in the care of this patient.   Time In: 1310 Time Out: 1350 Total Time 28mn Prolonged Time Billed  no         APershing Proud NP  19/57/4734 2:49 PM  Please contact Palliative Medicine Team phone at [phone] questions and concerns.

## 2015-08-27 LAB — GLUCOSE, CAPILLARY
Glucose-Capillary: 132 mg/dL — ABNORMAL HIGH (ref 65–99)
Glucose-Capillary: 83 mg/dL (ref 65–99)

## 2015-08-27 MED ORDER — MORPHINE SULFATE (PF) 2 MG/ML IV SOLN
2.0000 mg | INTRAVENOUS | Status: DC | PRN
  Administered 2015-08-27: 2 mg via INTRAVENOUS
  Administered 2015-08-27: 3 mg via INTRAVENOUS
  Administered 2015-08-27: 2 mg via INTRAVENOUS
  Administered 2015-08-28 (×2): 3 mg via INTRAVENOUS
  Administered 2015-08-28 (×2): 4 mg via INTRAVENOUS
  Administered 2015-08-29 (×3): 2 mg via INTRAVENOUS
  Filled 2015-08-27: qty 2
  Filled 2015-08-27 (×4): qty 1
  Filled 2015-08-27 (×4): qty 2
  Filled 2015-08-27 (×2): qty 1

## 2015-08-27 MED ORDER — GLYCOPYRROLATE 0.2 MG/ML IJ SOLN
0.2000 mg | INTRAMUSCULAR | Status: DC | PRN
  Filled 2015-08-27: qty 1

## 2015-08-27 NOTE — Progress Notes (Signed)
Attempted condom catheter placement twice and was unsuccessful in adhering and staying intact.  Per handoff report there were numerous attempts to place a condom catheter Monday as well.  Patient urinates frequently and he is incontinent.

## 2015-08-27 NOTE — Progress Notes (Signed)
   08/27/15 1300  Clinical Encounter Type  Visited With Family  Visit Type Follow-up;Psychological support;Spiritual support;Critical Care  Referral From Nurse;Family  Consult/Referral To Chaplain  Spiritual Encounters  Spiritual Needs Emotional;Other (Comment) (Pastoral Conversation/Support)  Stress Factors  Patient Stress Factors None identified  Family Stress Factors Exhausted;Family relationships;Other (Comment);Lack of knowledge   I was requested by the patient's daughter to be present during her talk with the funeral home planner.  I sat through the conversation, which made the patient's daughter and her husband very nervous, which they confirmed to me afterward. The daughter felt pressured by the funeral home and was exhausted after the hour and a half meeting. The patient's family were concerned about the financial cost of a funeral and stated that the patient would want something simple.  The patient's family and I looked at numbers of other funeral homes to check on other prices.  I will follow up tomorrow with the family.     [place] M.Div.

## 2015-08-27 NOTE — Clinical Social Work Note (Signed)
Clinical Social Work Assessment  Patient Details  Name: [name] MRN: [id number] Date of Birth: 02-28-1923  Date of referral:  08/27/15               Reason for consult:  Discharge Planning                Permission sought to share information with:  Family Supports Permission granted to share information::     Name::     Darnelle Bos  Agency::     Relationship::  daughter  Contact Information:  [phone]  Housing/Transportation Living arrangements for the past 2 months:  [place] of Information:  Adult Children Patient Interpreter Needed:  None Criminal Activity/Legal Involvement Pertinent to Current Situation/Hospitalization:  No - Comment as needed Significant Relationships:  Adult Children Lives with:  Facility Resident Do you feel safe going back to the place where you live?  No Need for family participation in patient care:  Yes (Comment)  Care giving concerns:  Pt admitted from [facility]. Pt family has met with Palliative Care and transitioned pt to full comfort care. Referral to CSW was for residential hospice however, spoke with attending MD today who has assessed pt and does not feel pt is stable for transport. Attending MD requesting GIP evaluation.   Social Worker assessment / plan:    CSW met with pt daughter at bedside. CSW introduced self and explained role. CSW discussed that CSW spoke with MD who does not feel pt would be stable to transfer to residential hospice. Pt daughter stated that MD discussed with pt daughter as well. CSW discussed with pt daughter that MD wanted CSW to explore GIP, hospice in the hospital and explained concept to pt daughter and offered choice. Pt daughter agreeable to [facility] to evaluate for GIP status.   CSW contacted [facility] liaison and made GIP referral. [facility] will process referral and follow up with CSW.    CSW to continue to follow to provide support.   Employment status:  Retired Nurse, adult PT Recommendations:  Not assessed at this time Information / Referral to community resources:  Other (Comment Required) (GIP referral)  Patient/Family's Response to care:  Pt daughter and pt granddaughter supportive and at bedside with pt. Pt family coping as well as can be anticipated and agreeable to any additional supports and agree with MD assessment that pt would not be stable for transport. Pt daughter aware of referral to explore if pt can be made GIP in hospital through [facility].  Patient/Family's Understanding of and Emotional Response to Diagnosis, Current Treatment, and Prognosis:  Pt family aware of pt diagnosis and poor prognosis. Pt family emotional, but coping as well as to be anticipated at this time.   Emotional Assessment Appearance:  Appears stated age Attitude/Demeanor/Rapport:  Unable to Assess Affect (typically observed):  Unable to Assess Orientation:  Oriented to Self Alcohol / Substance use:  Not Applicable Psych involvement (Current and /or in the community):  No (Comment)  Discharge Needs  Concerns to be addressed:  No discharge needs identified Readmission within the last 30 days:  No Current discharge risk:  Terminally ill Barriers to Discharge:  No Barriers Identified   [place], [place], LCSW 08/27/2015, 11:07 AM [phone]

## 2015-08-27 NOTE — Progress Notes (Signed)
Daily Progress Note   Patient Name: [name]       Date: 08/27/2015 DOB: [birth year]  Age: 82 y.o. MRN#: [id number] Attending Physician: Theodis Blaze, MD Primary Care Physician: Odette Fraction, MD Admit Date: 08/23/2015  Reason for Consultation/Follow-up: Establishing goals of care  Subjective: [name] is actually a little more alert today. Daughter, [name], confirms full comfort care at this point. She is very attentive and comforting to her father. She has had a long day and stressed from meeting with funeral home and this was a bad experience for her (felt very pressured financially). He has recently had pain medication but is still quite tense and agitated. Discussed plan with [name] - will allow larger range for pain medication and also may have medication for anxiety if needed. Emotional support provided to [facility], RN currently evaluating for inpatient GIP on unit. Discussed plan with RN.    Length of Stay: 4 days  Current Medications: Scheduled Meds:  . antiseptic oral rinse  7 mL Mouth Rinse q12n4p  . chlorhexidine  15 mL Mouth Rinse BID  . Chlorhexidine Gluconate Cloth  6 each Topical Q0600  . mupirocin ointment  1 application Nasal BID  . sodium chloride  3 mL Intravenous Q12H    Continuous Infusions:    PRN Meds: glycopyrrolate, LORazepam, loteprednol, metoprolol, morphine injection, ondansetron **OR** ondansetron (ZOFRAN) IV  Physical Exam: Physical Exam  Constitutional: He appears well-developed. He appears lethargic.  Tense, appears uncomfortable  HENT:  NGT LIWS  Cardiovascular: Tachycardia present.   Pulmonary/Chest: Effort normal. No accessory muscle usage. No tachypnea. No respiratory distress.  Abdominal: Soft. He exhibits distension.    Neurological: He appears lethargic. He is disoriented.                Vital Signs: BP 152/92 mmHg  Pulse 40  Temp(Src) 97.6 F (36.4 C) (Axillary)  Resp 25  Ht 5\' 10"  (1.778 m)  Wt 61.2 kg (134 lb 14.7 oz)  BMI 19.36 kg/m2  SpO2 93% SpO2: SpO2: 93 % O2 Device: O2 Device: Nasal Cannula O2 Flow Rate: O2 Flow Rate (L/min): 6 L/min  Intake/output summary:   Intake/Output Summary (Last 24 hours) at 08/27/15 1533 Last data filed at 08/27/15 1500  Gross per 24 hour  Intake    450 ml  Output    200 ml  Net    250 ml   LBM: Last BM Date: 08/19/15 Baseline Weight: Weight: 61.2 kg (134 lb 14.7 oz) Most recent weight: Weight: 61.2 kg (134 lb 14.7 oz)       Palliative Assessment/Data: Flowsheet Rows        Most Recent Value   Intake Tab    Referral Department  Hospitalist   Unit at Time of Referral  ICU   Palliative Care Primary Diagnosis  Sepsis/Infectious Disease   Palliative Care Type  New Palliative care   Reason for referral  Non-pain Symptom, End of Life Care Assistance   Date first seen by Palliative Care  08/24/15   Clinical Assessment    Palliative Performance Scale Score  10%   Pain Max last 24 hours  4   Pain Min Last 24 hours  3   Anxiety Max Last 24 Hours  4   Anxiety Min Last 24 Hours  3   Psychosocial & Spiritual Assessment    Palliative Care Outcomes    Patient/Family meeting held?  Yes   Who was at the meeting?  grand daughter in the room, daughter [name] over the phone   Palliative Care follow-up planned  Yes, Facility      Additional Data Reviewed: CBC    Component Value Date/Time   WBC 9.3 08/26/2015 0333   RBC 3.46* 08/26/2015 0333   HGB 11.1* 08/26/2015 0333   HCT 35.4* 08/26/2015 0333   HCT QUANTITY NOT SUFFICIENT, UNABLE TO PERFORM TEST 08/19/2015 0355   PLT 78* 08/26/2015 0333   MCV 102.3* 08/26/2015 0333   MCH 32.1 08/26/2015 0333   MCHC 31.4 08/26/2015 0333   RDW 14.3 08/26/2015 0333   LYMPHSABS 0.2* 08/23/2015 1319   MONOABS  0.0* 08/23/2015 1319   EOSABS 0.0 08/23/2015 1319   BASOSABS 0.0 08/23/2015 1319    CMP     Component Value Date/Time   NA 149* 08/26/2015 0333   K 3.4* 08/26/2015 0333   CL 116* 08/26/2015 0333   CO2 22 08/26/2015 0333   GLUCOSE 74 08/26/2015 0333   BUN 49* 08/26/2015 0333   CREATININE 1.69* 08/26/2015 0333   CREATININE 1.34 04/16/2014 1544   CALCIUM 8.2* 08/26/2015 0333   PROT 4.8* 08/24/2015 0344   ALBUMIN 2.5* 08/24/2015 0344   AST 60* 08/24/2015 0344   ALT 24 08/24/2015 0344   ALKPHOS 61 08/24/2015 0344   BILITOT 1.7* 08/24/2015 0344   GFRNONAA 34* 08/26/2015 0333   GFRNONAA 47* 04/16/2014 1544   GFRAA 39* 08/26/2015 0333   GFRAA 54* 04/16/2014 1544       Problem List:  Patient Active Problem List   Diagnosis Date Noted  . Sepsis due to Escherichia coli (E. coli) ([place]) 08/25/2015  . NSTEMI (non-ST elevated myocardial infarction) ([place]) 08/24/2015  . Acute renal failure ([place]) 08/24/2015  . Acute respiratory failure with hypoxia ([place]) 08/24/2015  . Hypocalcemia 08/24/2015  . Goals of care, counseling/discussion   . Normocytic anemia 08/23/2015  . Thrombocytopenia ([place])   . Palliative care encounter   . DNR (do not resuscitate)   . SBO (small bowel obstruction) ([place]) 08/14/2015  . BPH (benign prostatic hyperplasia)   . Abdominal pain, acute 08/28/2012  . Pacemaker-Medtronic 12/22/2010  . Atrial fibrillation ([place]) 08/28/2010     Palliative Care Assessment & Plan    1.Code Status:  DNR    Code Status Orders        Start  Ordered   08/23/15 0457  Do not attempt resuscitation (DNR)   Continuous    Question Answer Comment  In the event of cardiac or respiratory ARREST Do not call a "code blue"   In the event of cardiac or respiratory ARREST Do not perform Intubation, CPR, defibrillation or ACLS   In the event of cardiac or respiratory ARREST Use medication by any route, position, wound care, and other measures to relive pain and suffering. May use  oxygen, suction and manual treatment of airway obstruction as needed for comfort.      08/23/15 0502    Code Status History    Date Active Date Inactive Code Status Order ID Comments User Context   08/15/2015  1:38 AM 08/21/2015  5:10 PM DNR [id number]  Lily Kocher, MD Inpatient   08/07/2013 12:56 PM 08/07/2013  5:19 PM Full Code [id number]  [name], MD Inpatient   08/29/2012 12:00 AM 09/02/2012  4:22 PM Full Code [id number]  Kaylyn Lim, RN Inpatient    Advance Directive Documentation        Most Recent Value   Type of Advance Directive  Out of facility DNR (pink MOST or yellow form), Living will   Pre-existing out of facility DNR order (yellow form or pink MOST form)     "MOST" Form in Place?         2. Goals of Care/Additional Recommendations:  Continue antibiotics and NGT but otherwise full comfort care.   Desire for further Chaplaincy support:yes  Psycho-social Needs: Caregiving  Support/Resources  3. Symptom Management:      1. Pain: Continue morphine 2-4 mg every 1 hour prn.       2. Anxiety/agitation: Lorazepam 1-2 mg every 4 hours.       3. Secretions: None currently. Robinul 0.2 every 4 hours prn.   4. Palliative Prophylaxis:   Frequent Pain Assessment and Oral Care  5. Prognosis: Hours - Days  6. Discharge Planning:  Anticipated Hospital Death      Thank you for allowing the Palliative Medicine Team to assist in the care of this patient.   Time In: 1500 Time Out: 1530 Total Time 66min Prolonged Time Billed  no         Pershing Proud, NP  [id number], 3:33 PM  Please contact Palliative Medicine Team phone at [phone] for questions and concerns.

## 2015-08-27 NOTE — Progress Notes (Signed)
Patient ID: [name], male   DOB: 02-12-1923, 82 y.o.   MRN: [id number]  [facility] PROGRESS NOTE  CYPRESS FANFAN PXT:[id number] DOB: 10-08-1922 DOA: 08/23/2015 PCP: Odette Fraction, MD   Brief narrative:    82 y.o. male with PMH of chronic atrial fibrillation no longer on anticoagulation, tachy-brady syndrome status post pacer placement, and dementia who presented from his nursing home with complaints of constipation and abdominal pain. Patient was just discharged from this institution on 08/19/2015 following management of an SBO. He was discharged in improved and stable condition with a clear liquid diet. Per report patient has had increased confusion since the recent hospitalization, increasing abdominal distention, abdominal pain, and has not moved his bowels in the last 4-5 days despite an aggressive bowel regimen at his SNF with enemas, senna, MiraLAX, and attempts at manual disimpaction.   Assessment/Plan:    Principal Problem:   Severe sepsis with acute organ dysfunction/Septic shock ([place]) - pt met criteria for sepsis on admission, source likely UTI, urine culture with E. Coli - sepsis protocol initiated, vancomycin and zosyn started - blood cultures were not obtained on admission, not clear why but apparently per nursing staff pt to uncomfortable and family asked not to do it - WBC now trending down, on 1/23 narrowed down ABX to Ancef based on sensitivity report - pt is not responding to current medical measures, still lethargic - family now in agreement with focus on full comfort, no more vital signs, no further blood work, stop IV ABX  Active Problems:   Abdominal pain, acute secondary to SBO (small bowel obstruction) ([place]) - with NGT in place, large amount of stool in colon - family very clear that no agressive interventions desired  - provide analgesia as needed, transfer to palliative care unit     NSTEMI (non-ST elevated myocardial infarction) ([place]) -  peak troponin 0.58, trending down - pt is not candidate for further aggressive cardiac interventions - again, main focus is comfort     Acute renal failure ([place]) with metabolic acidosis  - secondary to acute illness outlined above - continue trending down  - no further blood work to ensure comfort     Hypernatremia - from no oral intake - family understands guarded prognosis     Acute respiratory failure with hypoxia ([place]) - likely secondary to acute issues as well, ? Aspiration - CXR on admission with no acute findings but can not rule out aspiration - keep on oxygen for comfort     Hypocalcemia - supplemented     Atrial fibrillation (HCC) - CHADS 2 4-5 - pt determined not to be a candidate for [facility] due to high risk bleed     Thrombocytopenia (HCC) - stopped Heparin SQ and placed in SCD's     Pacemaker-Medtronic - monitor     BPH (benign prostatic hyperplasia) - foley in place, can remove to ensure comfort if family desires     Palliative care encounter - comfort will be provided  - pt unstable for transfer, in hospital death expected     DNR (do not resuscitate)    Normocytic anemia - stable for now    Functional quadriplegia - from multiple acute and chronic issues - bed bound     Severe PCM - in the context of acute and progressive illness, recurrent SBO - scopolamine patch to control secretions   DVT prophylaxis - SCD  Code Status: DNR Family Communication:  plan of care discussed with the granddaughter at  bedside, daughter over the phone  Disposition Plan: transfer to palliative care floor   IV access:  Peripheral IV  Procedures and diagnostic studies:    Ct Abdomen Pelvis Wo Contrast 08/23/2015 Interval worsening in small bowel dilatation, measuring up to 4.9 cm in diameter, reflecting worsening relatively high-grade small bowel obstruction. Stomach also filled with fluid and air. Worsening mesenteric edema at the right lower quadrant, with new  engorgement of associated mesenteric nodes. This appears to reflect obstruction due to a complex knot created by the terminal ileum, through which the mid to distal ileum extends. The terminal ileum also appears to twist on itself, with an apparent mesenteric band extending about the knot. This may reflect a complex configuration of small bowel volvulus, or possibly internal herniation through a postoperative mesenteric defect. 2. Small amount of ascites tracking about the liver and within the pelvis, increased from the prior study. Mild fluid tracks along the paracolic gutters bilaterally. 3. Bladder moderately distended and partially filled with air. This may reflect recent Foley catheter placement. Would correlate clinically. 4. Trace bilateral pleural effusions, with mild bibasilar atelectasis and a bleb at the left lung base. 5. Small left renal cyst noted. 6. Scattered calcification along the abdominal aorta and its branches. 7. Scattered diverticulosis along the entirety of the colon. The colon and rectum contain only a small amount of stool. Laxatives and enemas are not indicated, as the patient's symptoms reflect the underlying small bowel obstruction. 8. Enlarged prostate noted.   Dg Chest 1 View 08/23/2015   Enlarged cardiac silhouette without evidence of pulmonary edema. Chronic interstitial lung disease. Superior subluxation of bilateral femoral heads, left worse than right, probably chronic and related to rotator cuff injury. Correlation to clinical exam may be considered.   Dg Abd Portable 1v-small Bowel Obstruction Protocol-initial, 8 Hr Delay 08/23/2015 Air distended loops of small bowel compatible with small bowel obstruction. Follow-up recommended. A air distention of the stomach, new from prior study.   Dg Abd Portable 1v-small Bowel Protocol-position Verification 08/23/2015  Again noted gaseous distended small bowel loops within upper abdomen consistent with small bowel obstruction. NG  tube coiled within stomach with tip in proximal stomach.   Medical Consultants:  Surgery  PCCM and Cardiology over the phone PCT  Other Consultants:  None  IAnti-Infectives:   Vancomycin 1/21 --> 1/23 Zosyn 1/21 --> 1/23 Ancef 1/23 --> 1/25   Faye Ramsay, MD  [facility] Pager [phone]  If 7PM-7AM, please contact night-coverage [email or web address] Password [email or web address] 08/27/2015, 12:21 PM   LOS: 4 days   HPI/Subjective: No events overnight. Pt comfortable this AM.   Objective: Filed Vitals:   08/26/15 2318 08/27/15 0402 08/27/15 0805 08/27/15 1155  BP:      Pulse:      Temp: 97.7 F (36.5 C) 97.2 F (36.2 C) 97.4 F (36.3 C) 97.6 F (36.4 C)  TempSrc: Axillary Axillary Axillary Axillary  Resp:      Height:      Weight:      SpO2:        Intake/Output Summary (Last 24 hours) at 08/27/15 1221 Last data filed at 08/27/15 1008  Gross per 24 hour  Intake    430 ml  Output    200 ml  Net    230 ml    Exam:   General:  Pt is lethargic, NAD  Cardiovascular: Irregular rate and rhythm, no rubs, no gallops  Respiratory: Diminished breath sounds at bases with scattered rhonchi bilaterally   Abdomen: Slightly  tender in epigastric area, less distended, no guarding  Data Reviewed: Basic Metabolic Panel:  Recent Labs Lab 08/23/15 1319 08/23/15 1810 08/24/15 0344 08/25/15 0402 08/26/15 0333  NA 148* 142 143 145 149*  K 3.5 4.0 4.2 3.7 3.4*  CL 103 108 109 113* 116*  CO2 19* 21* 20* 21* 22  GLUCOSE 101* 68 83 84 74  BUN 32* 40* 46* 53* 49*  CREATININE 1.37* 1.94* 2.18* 1.90* 1.69*  CALCIUM 5.8* 7.5* 7.7* 7.6* 8.2*  MG  --   --  1.7  --   --    Liver Function Tests:  Recent Labs Lab 08/23/15 0240 08/23/15 1319 08/23/15 1810 08/24/15 0344  AST 31 32 40 60*  ALT 18 15* 18 24  ALKPHOS 50 350* 101 61  BILITOT 1.5* 1.7* 1.9* 1.7*  PROT 5.8* 4.7* 4.9* 4.8*  ALBUMIN 3.1* 2.0* 2.5* 2.5*    Recent Labs Lab 08/23/15 0240  LIPASE 38   CBC:  Recent  Labs Lab 08/23/15 0240 08/23/15 1319 08/23/15 1910 08/24/15 0344 08/25/15 0402 08/26/15 0333  WBC 19.8* 7.6 37.0* 57.5* 29.6* 9.3  NEUTROABS 18.6* 7.4  --   --   --   --   HGB 12.0* 9.7* 10.7* 10.6* 10.6* 11.1*  HCT 36.7* 30.0* 33.0* 32.1* 32.9* 35.4*  MCV 99.7 99.0 99.7 100.3* 99.7 102.3*  PLT 142* 99* 85* 93* 80* 78*   Cardiac Enzymes:  Recent Labs Lab 08/24/15 0344 08/24/15 0928 08/24/15 1458 08/24/15 2128 08/26/15 0333  TROPONINI 0.50* 0.46* 0.36* 0.33* 0.24*   CBG:  Recent Labs Lab 08/26/15 0810 08/26/15 2124 08/26/15 2316 08/27/15 0017 08/27/15 0815  GLUCAP 104* 71 64* 132* 83    Recent Results (from the past 240 hour(s))  Urine culture     Status: None   Collection Time: 08/23/15  9:57 AM  Result Value Ref Range Status   Specimen Description URINE, CLEAN CATCH  Final   Special Requests NONE  Final   Culture   Final    >=100,000 COLONIES/mL ESCHERICHIA COLI Performed at [facility]    Report Status 08/25/2015 FINAL  Final   Organism ID, Bacteria ESCHERICHIA COLI  Final      Susceptibility   Escherichia coli - MIC*    AMPICILLIN <=2 SENSITIVE Sensitive     CEFAZOLIN <=4 SENSITIVE Sensitive     CEFTRIAXONE <=1 SENSITIVE Sensitive     CIPROFLOXACIN <=0.25 SENSITIVE Sensitive     GENTAMICIN <=1 SENSITIVE Sensitive     IMIPENEM <=0.25 SENSITIVE Sensitive     NITROFURANTOIN 32 SENSITIVE Sensitive     TRIMETH/SULFA <=20 SENSITIVE Sensitive     AMPICILLIN/SULBACTAM <=2 SENSITIVE Sensitive     PIP/TAZO <=4 SENSITIVE Sensitive     * >=100,000 COLONIES/mL ESCHERICHIA COLI  MRSA PCR Screening     Status: Abnormal   Collection Time: 08/23/15  7:29 PM  Result Value Ref Range Status   MRSA by PCR POSITIVE (A) NEGATIVE Final    Scheduled Meds: . antiseptic oral rinse  7 mL Mouth Rinse q12n4p  . chlorhexidine  15 mL Mouth Rinse BID  . Chlorhexidine Gluconate Cloth  6 each Topical Q0600  . mupirocin ointment  1 application Nasal BID  . sodium  chloride  3 mL Intravenous Q12H   Continuous Infusions:

## 2015-08-28 NOTE — Progress Notes (Signed)
Patient ID: [name], male   DOB: 1922-12-12, 82 y.o.   MRN: [id number]  [facility] PROGRESS NOTE  JEFFERSON FULLAM HKV:[id number] DOB: 01/22/1923 DOA: 08/23/2015 PCP: Odette Fraction, MD   Brief narrative:    82 y.o. male with PMH of chronic atrial fibrillation no longer on anticoagulation, tachy-brady syndrome status post pacer placement, and dementia who presented from his nursing home with complaints of constipation and abdominal pain. Patient was just discharged from this institution on 08/19/2015 following management of an SBO. He was discharged in improved and stable condition with a clear liquid diet. Per report patient has had increased confusion since the recent hospitalization, increasing abdominal distention, abdominal pain, and has not moved his bowels in the last 4-5 days despite an aggressive bowel regimen at his SNF with enemas, senna, MiraLAX, and attempts at manual disimpaction.   Assessment/Plan:    Principal Problem:   Severe sepsis with acute organ dysfunction/Septic shock ([place]) - pt met criteria for sepsis on admission, source likely UTI, urine culture with E. Coli - sepsis protocol initiated, vancomycin and zosyn started - blood cultures were not obtained on admission, not clear why but apparently per nursing staff pt to uncomfortable and family asked not to do it - WBC now trending down, on 1/23 narrowed down ABX to Ancef based on sensitivity report - pt is not responding to current medical measures, still lethargic, comfortable  - full comfort now   Active Problems:   Abdominal pain, acute secondary to SBO (small bowel obstruction) ([place]) - with NGT in place, large amount of stool in colon - family very clear that no agressive interventions desired  - provide analgesia as needed, transfer to palliative care unit     NSTEMI (non-ST elevated myocardial infarction) ([place]) - peak troponin 0.58, trending down - pt is not candidate for further aggressive  cardiac interventions - again, main focus is comfort     Acute renal failure ([place]) with metabolic acidosis  - secondary to acute illness outlined above - no further blood work to ensure comfort     Hypernatremia - no further blood work     Acute respiratory failure with hypoxia ([place]) - likely secondary to acute issues as well, ? Aspiration - CXR on admission with no acute findings but can not rule out aspiration - keep on oxygen for comfort     Hypocalcemia - supplemented     Atrial fibrillation (HCC) - CHADS 2 4-5 - pt determined not to be a candidate for [facility] due to high risk bleed     Thrombocytopenia (HCC) - stopped Heparin SQ and placed in SCD's     Pacemaker-Medtr[employer] - monitor     BPH (benign prostatic hyperplasia) - foley in place, can remove to ensure comfort if family desires     Palliative care encounter - comfort will be provided  - pt unstable for transfer, in hospital death expected     DNR (do not resuscitate)    Normocytic anemia - stable for now    Functional quadriplegia - from multiple acute and chronic issues - bed bound     Severe PCM - in the context of acute and progressive illness, recurrent SBO - scopolamine patch to control secretions   DVT prophylaxis - SCD  Code Status: DNR Family Communication:  plan of care discussed with the granddaughter at bedside Disposition Plan: transfer to palliative care floor   IV access:  Peripheral IV  Procedures and diagnostic studies:    Ct  Abdomen Pelvis Wo Contrast 08/23/2015 Interval worsening in small bowel dilatation, measuring up to 4.9 cm in diameter, reflecting worsening relatively high-grade small bowel obstruction. Stomach also filled with fluid and air. Worsening mesenteric edema at the right lower quadrant, with new engorgement of associated mesenteric nodes. This appears to reflect obstruction due to a complex knot created by the terminal ileum, through which the mid to distal ileum  extends. The terminal ileum also appears to twist on itself, with an apparent mesenteric band extending about the knot. This may reflect a complex configuration of small bowel volvulus, or possibly internal herniation through a postoperative mesenteric defect. 2. Small amount of ascites tracking about the liver and within the pelvis, increased from the prior study. Mild fluid tracks along the paracolic gutters bilaterally. 3. Bladder moderately distended and partially filled with air. This may reflect recent Foley catheter placement. Would correlate clinically. 4. Trace bilateral pleural effusions, with mild bibasilar atelectasis and a bleb at the left lung base. 5. Small left renal cyst noted. 6. Scattered calcification along the abdominal aorta and its branches. 7. Scattered diverticulosis along the entirety of the colon. The colon and rectum contain only a small amount of stool. Laxatives and enemas are not indicated, as the patient's symptoms reflect the underlying small bowel obstruction. 8. Enlarged prostate noted.   Dg Chest 1 View 08/23/2015   Enlarged cardiac silhouette without evidence of pulmonary edema. Chronic interstitial lung disease. Superior subluxation of bilateral femoral heads, left worse than right, probably chronic and related to rotator cuff injury. Correlation to clinical exam may be considered.   Dg Abd Portable 1v-small Bowel Obstruction Protocol-initial, 8 Hr Delay 08/23/2015 Air distended loops of small bowel compatible with small bowel obstruction. Follow-up recommended. A air distention of the stomach, new from prior study.   Dg Abd Portable 1v-small Bowel Protocol-position Verification 08/23/2015  Again noted gaseous distended small bowel loops within upper abdomen consistent with small bowel obstruction. NG tube coiled within stomach with tip in proximal stomach.   Medical Consultants:  Surgery  PCCM and Cardiology over the phone PCT  Other Consultants:   None  IAnti-Infectives:   Vancomycin 1/21 --> 1/23 Zosyn 1/21 --> 1/23 Ancef 1/23 --> 1/25   Faye Ramsay, MD  [facility] Pager [phone]  If 7PM-7AM, please contact night-coverage [email or web address] Password [facility] 08/28/2015, 12:39 PM   LOS: 5 days   HPI/Subjective: No events overnight. Pt comfortable this AM.   Objective: Filed Vitals:   08/27/15 1700 08/27/15 2000 08/28/15 0753 08/28/15 0800  BP:   146/86   Pulse:   98   Temp: 97.9 F (36.6 C) 97.7 F (36.5 C)  97.8 F (36.6 C)  TempSrc: Axillary Axillary  Axillary  Resp:   6   Height:      Weight:      SpO2:        Intake/Output Summary (Last 24 hours) at 08/28/15 1239 Last data filed at 08/28/15 0600  Gross per 24 hour  Intake    180 ml  Output      0 ml  Net    180 ml    Exam:   General:  Pt is lethargic, NAD  Cardiovascular: Irregular rate and rhythm, no rubs, no gallops  Respiratory: Diminished breath sounds at bases with scattered rhonchi bilaterally   Abdomen: Slightly tender in epigastric area, less distended, no guarding  Data Reviewed: Basic Metabolic Panel:  Recent Labs Lab 08/23/15 1319 08/23/15 1810 08/24/15 0344 08/25/15 0402 08/26/15 0333  NA  148* 142 143 145 149*  K 3.5 4.0 4.2 3.7 3.4*  CL 103 108 109 113* 116*  CO2 19* 21* 20* 21* 22  GLUCOSE 101* 68 83 84 74  BUN 32* 40* 46* 53* 49*  CREATININE 1.37* 1.94* 2.18* 1.90* 1.69*  CALCIUM 5.8* 7.5* 7.7* 7.6* 8.2*  MG  --   --  1.7  --   --    Liver Function Tests:  Recent Labs Lab 08/23/15 0240 08/23/15 1319 08/23/15 1810 08/24/15 0344  AST 31 32 40 60*  ALT 18 15* 18 24  ALKPHOS 50 350* 101 61  BILITOT 1.5* 1.7* 1.9* 1.7*  PROT 5.8* 4.7* 4.9* 4.8*  ALBUMIN 3.1* 2.0* 2.5* 2.5*    Recent Labs Lab 08/23/15 0240  LIPASE 38   CBC:  Recent Labs Lab 08/23/15 0240 08/23/15 1319 08/23/15 1910 08/24/15 0344 08/25/15 0402 08/26/15 0333  WBC 19.8* 7.6 37.0* 57.5* 29.6* 9.3  NEUTROABS 18.6* 7.4  --   --   --    --   HGB 12.0* 9.7* 10.7* 10.6* 10.6* 11.1*  HCT 36.7* 30.0* 33.0* 32.1* 32.9* 35.4*  MCV 99.7 99.0 99.7 100.3* 99.7 102.3*  PLT 142* 99* 85* 93* 80* 78*   Cardiac Enzymes:  Recent Labs Lab 08/24/15 0344 08/24/15 0928 08/24/15 1458 08/24/15 2128 08/26/15 0333  TROPONINI 0.50* 0.46* 0.36* 0.33* 0.24*   CBG:  Recent Labs Lab 08/26/15 0810 08/26/15 2124 08/26/15 2316 08/27/15 0017 08/27/15 0815  GLUCAP 104* 71 64* 132* 83    Recent Results (from the past 240 hour(s))  Urine culture     Status: None   Collection Time: 08/23/15  9:57 AM  Result Value Ref Range Status   Specimen Description URINE, CLEAN CATCH  Final   Special Requests NONE  Final   Culture   Final    >=100,000 COLONIES/mL ESCHERICHIA COLI Performed at [facility]    Report Status 08/25/2015 FINAL  Final   Organism ID, Bacteria ESCHERICHIA COLI  Final      Susceptibility   Escherichia coli - MIC*    AMPICILLIN <=2 SENSITIVE Sensitive     CEFAZOLIN <=4 SENSITIVE Sensitive     CEFTRIAXONE <=1 SENSITIVE Sensitive     CIPROFLOXACIN <=0.25 SENSITIVE Sensitive     GENTAMICIN <=1 SENSITIVE Sensitive     IMIPENEM <=0.25 SENSITIVE Sensitive     NITROFURANTOIN 32 SENSITIVE Sensitive     TRIMETH/SULFA <=20 SENSITIVE Sensitive     AMPICILLIN/SULBACTAM <=2 SENSITIVE Sensitive     PIP/TAZO <=4 SENSITIVE Sensitive     * >=100,000 COLONIES/mL ESCHERICHIA COLI  MRSA PCR Screening     Status: Abnormal   Collection Time: 08/23/15  7:29 PM  Result Value Ref Range Status   MRSA by PCR POSITIVE (A) NEGATIVE Final    Scheduled Meds: . antiseptic oral rinse  7 mL Mouth Rinse q12n4p  . chlorhexidine  15 mL Mouth Rinse BID  . mupirocin ointment  1 application Nasal BID  . sodium chloride  3 mL Intravenous Q12H   Continuous Infusions:

## 2015-08-28 NOTE — Progress Notes (Signed)
This RN received pt from ICU, report from [facility], Therapist, sports. Pt transferred from stretcher to bed without waking. Daughter, Lelon Frohlich, at bedside. Pt. with resps 8/min, on O2 4 L/m by nasal cannula. Total comfort care. Suction to LIWS. Pt in NAD, recent MSO4 and ativan at 1508 prior to transport.Marland Kitchen

## 2015-08-28 NOTE — Progress Notes (Signed)
   08/28/15 1100  Clinical Encounter Type  Visited With Family  Visit Type Follow-up;Psychological support;Spiritual support;Critical Care  Referral From Family  Consult/Referral To Chaplain  Spiritual Encounters  Spiritual Needs Prayer;Emotional;Other (Comment) Academic librarian)  Stress Factors  Patient Stress Factors None identified  Family Stress Factors Exhausted;Financial concerns   I visited with the patient's family per previous visits. The patient's granddaughter was in the room asleep upon my arrival and his daughter was at the bedside. The patient's daughter stated that she was exhausted, but that she was doing better. She stated that she called around to other funeral homes and after the negative experience with [employer] the day before, decided not to use them for the patient's funeral arrangements when the time comes.  The patient's daughter requested prayer. We prayed and she said how soothing my prayers are to her.  I will continue to follow-up with the family.    Stewart M.Div.

## 2015-08-29 ENCOUNTER — Encounter (HOSPITAL_COMMUNITY): Payer: Self-pay

## 2015-08-29 ENCOUNTER — Inpatient Hospital Stay (HOSPITAL_COMMUNITY)
Admission: AD | Admit: 2015-08-29 | Discharge: 2015-09-01 | DRG: 388 | Disposition: A | Discharge status: Hospice | Source: Ambulatory Visit | Attending: Internal Medicine | Admitting: Internal Medicine

## 2015-08-29 DIAGNOSIS — R109 Unspecified abdominal pain: Secondary | ICD-10-CM | POA: Diagnosis present

## 2015-08-29 DIAGNOSIS — Z681 Body mass index (BMI) 19 or less, adult: Secondary | ICD-10-CM | POA: Diagnosis not present

## 2015-08-29 DIAGNOSIS — E43 Unspecified severe protein-calorie malnutrition: Secondary | ICD-10-CM | POA: Diagnosis present

## 2015-08-29 DIAGNOSIS — Z66 Do not resuscitate: Secondary | ICD-10-CM | POA: Diagnosis present

## 2015-08-29 DIAGNOSIS — K56609 Unspecified intestinal obstruction, unspecified as to partial versus complete obstruction: Secondary | ICD-10-CM | POA: Diagnosis present

## 2015-08-29 DIAGNOSIS — I482 Chronic atrial fibrillation: Secondary | ICD-10-CM | POA: Diagnosis not present

## 2015-08-29 DIAGNOSIS — R532 Functional quadriplegia: Secondary | ICD-10-CM | POA: Diagnosis present

## 2015-08-29 DIAGNOSIS — I214 Non-ST elevation (NSTEMI) myocardial infarction: Secondary | ICD-10-CM | POA: Diagnosis present

## 2015-08-29 DIAGNOSIS — E870 Hyperosmolality and hypernatremia: Secondary | ICD-10-CM | POA: Diagnosis present

## 2015-08-29 DIAGNOSIS — E872 Acidosis: Secondary | ICD-10-CM | POA: Diagnosis present

## 2015-08-29 DIAGNOSIS — K5660 Unspecified intestinal obstruction: Secondary | ICD-10-CM | POA: Diagnosis present

## 2015-08-29 DIAGNOSIS — Z515 Encounter for palliative care: Secondary | ICD-10-CM | POA: Diagnosis present

## 2015-08-29 DIAGNOSIS — A419 Sepsis, unspecified organism: Secondary | ICD-10-CM | POA: Diagnosis present

## 2015-08-29 DIAGNOSIS — N179 Acute kidney failure, unspecified: Secondary | ICD-10-CM | POA: Diagnosis present

## 2015-08-29 DIAGNOSIS — J9601 Acute respiratory failure with hypoxia: Secondary | ICD-10-CM | POA: Diagnosis not present

## 2015-08-29 DIAGNOSIS — K5669 Other intestinal obstruction: Secondary | ICD-10-CM | POA: Diagnosis not present

## 2015-08-29 DIAGNOSIS — R100 Acute abdomen: Secondary | ICD-10-CM | POA: Diagnosis not present

## 2015-08-29 DIAGNOSIS — A4151 Sepsis due to Escherichia coli [E. coli]: Secondary | ICD-10-CM | POA: Diagnosis not present

## 2015-08-29 MED ORDER — GLYCOPYRROLATE 0.2 MG/ML IJ SOLN: 0.2000 mg | INTRAMUSCULAR | Status: AC | PRN

## 2015-08-29 MED ORDER — MORPHINE SULFATE (PF) 2 MG/ML IV SOLN
INTRAVENOUS | Status: AC
  Administered 2015-08-29: 2 mg via INTRAVENOUS
  Filled 2015-08-29: qty 1

## 2015-08-29 MED ORDER — MORPHINE SULFATE (PF) 2 MG/ML IV SOLN: 2.0000 mg | INTRAVENOUS | Status: AC | PRN

## 2015-08-29 MED ORDER — METOPROLOL TARTRATE 1 MG/ML IV SOLN: 5.0000 mg | Freq: Four times a day (QID) | INTRAVENOUS | Status: DC | PRN

## 2015-08-29 MED ORDER — MORPHINE SULFATE (PF) 2 MG/ML IV SOLN
2.0000 mg | INTRAVENOUS | Status: DC | PRN
  Administered 2015-08-29 – 2015-09-01 (×16): 2 mg via INTRAVENOUS
  Filled 2015-08-29 (×15): qty 1

## 2015-08-29 MED ORDER — SCOPOLAMINE 1 MG/3DAYS TD PT72
1.0000 | MEDICATED_PATCH | TRANSDERMAL | Status: DC
  Administered 2015-08-29: 1.5 mg via TRANSDERMAL
  Filled 2015-08-29: qty 1

## 2015-08-29 MED ORDER — ONDANSETRON HCL 4 MG/2ML IJ SOLN: 4.0000 mg | Freq: Four times a day (QID) | INTRAMUSCULAR | Status: AC | PRN

## 2015-08-29 MED ORDER — LORAZEPAM 2 MG/ML IJ SOLN: 1.0000 mg | INTRAMUSCULAR | Status: AC | PRN

## 2015-08-29 MED ORDER — ONDANSETRON HCL 4 MG/2ML IJ SOLN: 4.0000 mg | Freq: Four times a day (QID) | INTRAMUSCULAR | Status: DC | PRN

## 2015-08-29 MED ORDER — SODIUM CHLORIDE 0.9 % IV SOLN
INTRAVENOUS | Status: DC | PRN
  Administered 2015-08-29: 250 mL via INTRAVENOUS

## 2015-08-29 MED ORDER — SODIUM CHLORIDE 0.9 % IV SOLN: INTRAVENOUS | Status: DC | PRN

## 2015-08-29 MED ORDER — MORPHINE SULFATE (PF) 2 MG/ML IV SOLN
2.0000 mg | Freq: Once | INTRAVENOUS | Status: AC
  Administered 2015-08-29: 2 mg via INTRAVENOUS
  Filled 2015-08-29: qty 1

## 2015-08-29 NOTE — Care Management Important Message (Signed)
Important Message  Patient Details Important Message  Patient Details IM Letter given to Nora/Case Manager to present to Patient Name: [name] MRN: [id number] Date of Birth: 04-08-1923   Medicare Important Message Given:  Yes    Camillo Flaming 08/29/2015, 11:26 AM Name: [name] MRN: [id number] Date of Birth: 1923-07-17   Medicare Important Message Given:  Yes    Camillo Flaming 08/29/2015, 11:26 AM

## 2015-08-29 NOTE — Progress Notes (Signed)
Patient ID: [name], male   DOB: September 21, 1922, 82 y.o.   MRN: [id number]  [facility] PROGRESS NOTE  [name] HKV:[id number] DOB: 1923-01-19 DOA: 08/23/2015 PCP: Odette Fraction, MD   Brief narrative:    82 y.o. male with PMH of chronic atrial fibrillation no longer on anticoagulation, tachy-brady syndrome status post pacer placement, and dementia who presented from his nursing home with complaints of constipation and abdominal pain. Patient was just discharged from this institution on 08/19/2015 following management of an SBO. He was discharged in improved and stable condition with a clear liquid diet. Per report patient has had increased confusion since the recent hospitalization, increasing abdominal distention, abdominal pain, and has not moved his bowels in the last 4-5 days despite an aggressive bowel regimen at his SNF with enemas, senna, MiraLAX, and attempts at manual disimpaction.   Assessment/Plan:    Principal Problem:   Severe sepsis with acute organ dysfunction/Septic shock ([place]) - pt met criteria for sepsis on admission, source likely UTI, urine culture with E. Coli - sepsis protocol initiated, vancomycin and zosyn started - pt is not responding to current medical measures, still lethargic, comfortable  - full comfort now   Active Problems:   Abdominal pain, acute secondary to SBO (small bowel obstruction) ([place]) - with NGT in place, large amount of stool in colon - provide analgesia as needed, keep in palliative care unit     NSTEMI (non-ST elevated myocardial infarction) ([place]) - peak troponin 0.58, trending down - pt is not candidate for further aggressive cardiac interventions - again, main focus is comfort     Acute renal failure (HCC) with metabolic acidosis  - secondary to acute illness outlined above - no further blood work to ensure comfort     Hypernatremia - no further blood work     Acute respiratory failure with hypoxia ([place]) -  likely secondary to acute issues as well, ? Aspiration - keep on oxygen for comfort     Hypocalcemia - supplemented     Atrial fibrillation (HCC) - CHADS 2 4-5 - pt determined not to be a candidate for [facility] due to high risk bleed     Thrombocytopenia (HCC) - stopped Heparin SQ and placed in SCD's     Pacemaker-Medtronic - monitor     BPH (benign prostatic hyperplasia) - foley in place, can remove to ensure comfort if family desires     Palliative care encounter - comfort will be provided  - pt unstable for transfer, in hospital death expected     DNR (do not resuscitate)    Normocytic anemia - stable for now    Functional quadriplegia - from multiple acute and chronic issues - bed bound     Severe PCM - in the context of acute and progressive illness, recurrent SBO - scopolamine patch to control secretions   DVT prophylaxis - SCD  Code Status: DNR Family Communication:  plan of care discussed with the granddaughter at bedside Disposition Plan: full comfort   IV access:  Peripheral IV  Procedures and diagnostic studies:    Ct Abdomen Pelvis Wo Contrast 08/23/2015 Interval worsening in small bowel dilatation, measuring up to 4.9 cm in diameter, reflecting worsening relatively high-grade small bowel obstruction. Stomach also filled with fluid and air. Worsening mesenteric edema at the right lower quadrant, with new engorgement of associated mesenteric nodes. This appears to reflect obstruction due to a complex knot created by the terminal ileum, through which the mid to distal ileum  extends. The terminal ileum also appears to twist on itself, with an apparent mesenteric band extending about the knot. This may reflect a complex configuration of small bowel volvulus, or possibly internal herniation through a postoperative mesenteric defect. 2. Small amount of ascites tracking about the liver and within the pelvis, increased from the prior study. Mild fluid tracks along the  paracolic gutters bilaterally. 3. Bladder moderately distended and partially filled with air. This may reflect recent Foley catheter placement. Would correlate clinically. 4. Trace bilateral pleural effusions, with mild bibasilar atelectasis and a bleb at the left lung base. 5. Small left renal cyst noted. 6. Scattered calcification along the abdominal aorta and its branches. 7. Scattered diverticulosis along the entirety of the colon. The colon and rectum contain only a small amount of stool. Laxatives and enemas are not indicated, as the patient's symptoms reflect the underlying small bowel obstruction. 8. Enlarged prostate noted.   Dg Chest 1 View 08/23/2015   Enlarged cardiac silhouette without evidence of pulmonary edema. Chronic interstitial lung disease. Superior subluxation of bilateral femoral heads, left worse than right, probably chronic and related to rotator cuff injury. Correlation to clinical exam may be considered.   Dg Abd Portable 1v-small Bowel Obstruction Protocol-initial, 8 Hr Delay 08/23/2015 Air distended loops of small bowel compatible with small bowel obstruction. Follow-up recommended. A air distention of the stomach, new from prior study.   Dg Abd Portable 1v-small Bowel Protocol-position Verification 08/23/2015  Again noted gaseous distended small bowel loops within upper abdomen consistent with small bowel obstruction. NG tube coiled within stomach with tip in proximal stomach.   Medical Consultants:  Surgery  PCCM and Cardiology over the phone PCT  Other Consultants:  None  IAnti-Infectives:   Vancomycin 1/21 --> 1/23 Zosyn 1/21 --> 1/23 Ancef 1/23 --> 1/25   Faye Ramsay, MD  [facility] Pager [phone]  If 7PM-7AM, please contact night-coverage [email or web address] Password [facility] 08/29/2015, 6:47 AM   LOS: 6 days   HPI/Subjective: No events overnight. Pt comfortable this AM.   Objective: Filed Vitals:   08/28/15 0753 08/28/15 0800 08/28/15 2048 08/29/15 0501   BP: 146/86   119/52  Pulse: 98  98 99  Temp:  97.8 F (36.6 C)  98.4 F (36.9 C)  TempSrc:  Axillary  Axillary  Resp: 6   7  Height:      Weight:      SpO2:    100%    Intake/Output Summary (Last 24 hours) at 08/29/15 0647 Last data filed at 08/29/15 9470  Gross per 24 hour  Intake    263 ml  Output    575 ml  Net   -312 ml    Exam:   General:  Pt is lethargic, NAD  Respiratory: Diminished breath sounds at bases with scattered rhonchi bilaterally    Data Reviewed: Basic Metabolic Panel:  Recent Labs Lab 08/23/15 1319 08/23/15 1810 08/24/15 0344 08/25/15 0402 08/26/15 0333  NA 148* 142 143 145 149*  K 3.5 4.0 4.2 3.7 3.4*  CL 103 108 109 113* 116*  CO2 19* 21* 20* 21* 22  GLUCOSE 101* 68 83 84 74  BUN 32* 40* 46* 53* 49*  CREATININE 1.37* 1.94* 2.18* 1.90* 1.69*  CALCIUM 5.8* 7.5* 7.7* 7.6* 8.2*  MG  --   --  1.7  --   --    Liver Function Tests:  Recent Labs Lab 08/23/15 0240 08/23/15 1319 08/23/15 1810 08/24/15 0344  AST 31 32 40 60*  ALT 18  15* 18 24  ALKPHOS 50 350* 101 61  BILITOT 1.5* 1.7* 1.9* 1.7*  PROT 5.8* 4.7* 4.9* 4.8*  ALBUMIN 3.1* 2.0* 2.5* 2.5*    Recent Labs Lab 08/23/15 0240  LIPASE 38   CBC:  Recent Labs Lab 08/23/15 0240 08/23/15 1319 08/23/15 1910 08/24/15 0344 08/25/15 0402 08/26/15 0333  WBC 19.8* 7.6 37.0* 57.5* 29.6* 9.3  NEUTROABS 18.6* 7.4  --   --   --   --   HGB 12.0* 9.7* 10.7* 10.6* 10.6* 11.1*  HCT 36.7* 30.0* 33.0* 32.1* 32.9* 35.4*  MCV 99.7 99.0 99.7 100.3* 99.7 102.3*  PLT 142* 99* 85* 93* 80* 78*   Cardiac Enzymes:  Recent Labs Lab 08/24/15 0344 08/24/15 0928 08/24/15 1458 08/24/15 2128 08/26/15 0333  TROPONINI 0.50* 0.46* 0.36* 0.33* 0.24*   CBG:  Recent Labs Lab 08/26/15 0810 08/26/15 2124 08/26/15 2316 08/27/15 0017 08/27/15 0815  GLUCAP 104* 71 64* 132* 83    Recent Results (from the past 240 hour(s))  Urine culture     Status: None   Collection Time: 08/23/15  9:57  AM  Result Value Ref Range Status   Specimen Description URINE, CLEAN CATCH  Final   Special Requests NONE  Final   Culture   Final    >=100,000 COLONIES/mL ESCHERICHIA COLI Performed at [facility]    Report Status 08/25/2015 FINAL  Final   Organism ID, Bacteria ESCHERICHIA COLI  Final      Susceptibility   Escherichia coli - MIC*    AMPICILLIN <=2 SENSITIVE Sensitive     CEFAZOLIN <=4 SENSITIVE Sensitive     CEFTRIAXONE <=1 SENSITIVE Sensitive     CIPROFLOXACIN <=0.25 SENSITIVE Sensitive     GENTAMICIN <=1 SENSITIVE Sensitive     IMIPENEM <=0.25 SENSITIVE Sensitive     NITROFURANTOIN 32 SENSITIVE Sensitive     TRIMETH/SULFA <=20 SENSITIVE Sensitive     AMPICILLIN/SULBACTAM <=2 SENSITIVE Sensitive     PIP/TAZO <=4 SENSITIVE Sensitive     * >=100,000 COLONIES/mL ESCHERICHIA COLI  MRSA PCR Screening     Status: Abnormal   Collection Time: 08/23/15  7:29 PM  Result Value Ref Range Status   MRSA by PCR POSITIVE (A) NEGATIVE Final    Scheduled Meds: . antiseptic oral rinse  7 mL Mouth Rinse q12n4p  . chlorhexidine  15 mL Mouth Rinse BID  . sodium chloride  3 mL Intravenous Q12H   Continuous Infusions: . sodium chloride 250 mL (08/29/15 0404)

## 2015-08-29 NOTE — H&P (Signed)
[facility] History and Physical  [name] GLO:[id number] DOB: [birth year] DOA: 08/23/2015  Referring physician: ED physician PCP: Odette Fraction, MD   Chief Complaint: SBO, sepsis   HPI:  82 y.o. male with PMH of chronic atrial fibrillation no longer on anticoagulation, tachy-brady syndrome status post pacer placement, and dementia who presented from his nursing home with complaints of constipation and abdominal pain. Patient was just discharged from this institution on 08/19/2015 following management of an SBO. He was discharged in improved and stable condition with a clear liquid diet. Per report patient has had increased confusion since the recent hospitalization, increasing abdominal distention, abdominal pain, and has not moved his bowels in the last 4-5 days despite an aggressive bowel regimen at his SNF with enemas, senna, MiraLAX, and attempts at manual disimpaction.   Assessment/Plan:    Principal Problem:  Severe sepsis with acute organ dysfunction/Septic shock ([place]) - pt met criteria for sepsis on admission, source likely UTI, urine culture with E. Coli - sepsis protocol initiated, vancomycin and zosyn started - pt is not responding to current medical measures, still lethargic, comfortable  - no further IV ABX or blood work  - full comfort now   Active Problems:  Abdominal pain, acute secondary to SBO (small bowel obstruction) ([place]) - with NGT in place, large amount of stool in colon - provide analgesia as needed, keep in palliative care unit    NSTEMI (non-ST elevated myocardial infarction) ([place]) - peak troponin 0.58, trending down - pt is not candidate for further aggressive cardiac interventions - again, main focus is comfort    Acute renal failure (HCC) with metabolic acidosis  - secondary to acute illness outlined above - no further blood work to ensure comfort    Hypernatremia - no further blood work    Acute respiratory  failure with hypoxia ([place]) - likely secondary to acute issues as well, ? Aspiration - keep on oxygen for comfort    Hypocalcemia - supplemented    Atrial fibrillation (HCC) - CHADS 2 4-5 - pt determined not to be a candidate for [facility] due to high risk bleed    Thrombocytopenia (HCC) - stopped Heparin SQ and placed in SCD's    Pacemaker-[employer]tr[employer] - monitor    BPH (benign prostatic hyperplasia) - foley in place, can remove to ensure comfort if family desires    Palliative care encounter - comfort will be provided  - pt unstable for transfer, in hospital death expected    DNR (do not resuscitate)   Normocytic anemia - stable for now   Functional quadriplegia - from multiple acute and chronic issues - bed bound    Severe PCM - in the context of acute and progressive illness, recurrent SBO - scopolamine patch to control secretions   DVT prophylaxis - SCD  Code Status: DNR Family Communication: plan of care discussed with the granddaughter at bedside Disposition Plan: full comfort   IV access:  Peripheral IV  Procedures and diagnostic studies:   Ct Abdomen Pelvis Wo Contrast 08/23/2015 Interval worsening in small bowel dilatation, measuring up to 4.9 cm in diameter, reflecting worsening relatively high-grade small bowel obstruction. Stomach also filled with fluid and air. Worsening mesenteric edema at the right lower quadrant, with new engorgement of associated mesenteric nodes. This appears to reflect obstruction due to a complex knot created by the terminal ileum, through which the mid to distal ileum extends. The terminal ileum also appears to twist on itself, with an apparent mesenteric band extending about  the knot. This may reflect a complex configuration of small bowel volvulus, or possibly internal herniation through a postoperative mesenteric defect. 2. Small amount of ascites tracking about the liver and within the pelvis, increased from the  prior study. Mild fluid tracks along the paracolic gutters bilaterally. 3. Bladder moderately distended and partially filled with air. This may reflect recent Foley catheter placement. Would correlate clinically. 4. Trace bilateral pleural effusions, with mild bibasilar atelectasis and a bleb at the left lung base. 5. Small left renal cyst noted. 6. Scattered calcification along the abdominal aorta and its branches. 7. Scattered diverticulosis along the entirety of the colon. The colon and rectum contain only a small amount of stool. Laxatives and enemas are not indicated, as the patient's symptoms reflect the underlying small bowel obstruction. 8. Enlarged prostate noted.   Dg Chest 1 View 08/23/2015 Enlarged cardiac silhouette without evidence of pulmonary edema. Chronic interstitial lung disease. Superior subluxation of bilateral femoral heads, left worse than right, probably chronic and related to rotator cuff injury. Correlation to clinical exam may be considered.   Dg Abd Portable 1v-small Bowel Obstruction Protocol-initial, 8 Hr Delay 08/23/2015 Air distended loops of small bowel compatible with small bowel obstruction. Follow-up recommended. A air distention of the stomach, new from prior study.   Dg Abd Portable 1v-small Bowel Protocol-position Verification 08/23/2015 Again noted gaseous distended small bowel loops within upper abdomen consistent with small bowel obstruction. NG tube coiled within stomach with tip in proximal stomach.   Medical Consultants:  Surgery  PCCM and Cardiology over the phone PCT  Other Consultants:  None  IAnti-Infectives:   Vancomycin 1/21 --> 1/23 Zosyn 1/21 --> 1/23 Ancef 1/23 --> 1/25   Faye Ramsay, MD [facility] Pager [phone]         Code Status: DNR Family Communication: family at bedside  Disposition Plan: [facility] [phone]  Review of Systems:  Pt somnolent and unable to provide     Past  Medical History  Diagnosis Date  . Hyperlipidemia   . Atrial fibrillation (HCC)     permanent afib  . Long-term (current) use of anticoagulants     previously on coumadin, stopped by Dr [name] due to frequent falls  . BRADYCARDIA-TACHYCARDIA SYNDROME 2007    s/p PPM by Dr [name]  . BPH (benign prostatic hyperplasia)   . Glaucoma   . Pure hypercholesterolemia   . DNR (do not resuscitate)   . Dementia     Past Surgical History  Procedure Laterality Date  . Abdominal surgery  1986  . Hernia repair    . Pacemaker insertion  01/28/06; 08-07-13    PPM  (MDT) implanted by Dr [name] 2007; MDT [id number] gen change by Dr Rayann Heman 08/2013  . Permanent pacemaker generator change N/A 08/07/2013    Procedure: PERMANENT PACEMAKER GENERATOR CHANGE;  Surgeon: Coralyn Mark, MD;  Location: [place] CATH LAB;  Service: Cardiovascular;  Laterality: N/A;    Social History:  reports that he quit smoking about 54 years ago. His smoking use included Cigarettes. He has a 25 pack-year smoking history. He has never used smokeless tobacco. He reports that he does not drink alcohol or use illicit drugs.  Allergies  Allergen Reactions  . Tramadol Other (See Comments)    Extreme sleepiness and irritable    Family History  Problem Relation Age of Onset  . Heart attack Father     Medication Sig  acetaminophen (TYLENOL) 325 MG tablet Take 650 mg by  mouth See admin instructions. Take 2 tablets (650 mg) 2 times daily, may take 2 more tablets mid-day as needed for pain  aspirin EC 81 MG tablet Take 81 mg by mouth daily.  digoxin (LANOXIN) 0.125 MG tablet Take 1 tablet (125 mcg total) by mouth daily.  loteprednol (LOTEMAX) 0.5 % ophthalmic suspension Place 1 drop into both eyes daily as needed (itching eyes).   metoprolol succinate (TOPROL-XL) 25 MG 24 hr tablet TAKE 1 TABLET (25 MG TOTAL) BY MOUTH DAILY. TAKE WITH OR IMMEDIATELY FOLLOWING A MEAL.  mineral oil enema Place 1 enema rectally once.  pantoprazole (PROTONIX)  40 MG tablet Take 1 tablet (40 mg total) by mouth daily.  polyethylene glycol (MIRALAX / GLYCOLAX) packet Take 17 g by mouth 2 (two) times daily.  senna-docusate (SENOKOT-S) 8.6-50 MG tablet Take 1 tablet by mouth daily.  tamsulosin (FLOMAX) 0.4 MG CAPS capsule Take 0.4 mg by mouth at bedtime.  glycopyrrolate (ROBINUL) 0.2 MG/ML injection Inject 1 mL (0.2 mg total) into the vein every 4 (four) hours as needed (secretions, gurgling).  LORazepam (ATIVAN) 2 MG/ML injection Inject 0.5-1 mLs (1-2 mg total) into the vein every 4 (four) hours as needed for anxiety.  metoprolol (LOPRESSOR) 1 MG/ML injection Inject 5 mLs (5 mg total) into the vein every 6 (six) hours as needed (for HR > 110).  morphine 2 MG/ML injection Inject 1-2 mLs (2-4 mg total) into the vein every hour as needed.  ondansetron (ZOFRAN) 4 MG/2ML SOLN injection Inject 2 mLs (4 mg total) into the vein every 6 (six) hours as needed for nausea.    Physical Exam: Filed Vitals:   08/28/15 0753 08/28/15 0800 08/28/15 2048 08/29/15 0501  BP: 146/86   119/52  Pulse: 98  98 99  Temp:  97.8 F (36.6 C)  98.4 F (36.9 C)  TempSrc:  Axillary  Axillary  Resp: 6   7  Height:      Weight:      SpO2:    100%    Physical Exam  Constitutional: NAD  Labs on Admission:  Basic Metabolic Panel:  Recent Labs Lab 08/23/15 1319 08/23/15 1810 08/24/15 0344 08/25/15 0402 08/26/15 0333  NA 148* 142 143 145 149*  K 3.5 4.0 4.2 3.7 3.4*  CL 103 108 109 113* 116*  CO2 19* 21* 20* 21* 22  GLUCOSE 101* 68 83 84 74  BUN 32* 40* 46* 53* 49*  CREATININE 1.37* 1.94* 2.18* 1.90* 1.69*  CALCIUM 5.8* 7.5* 7.7* 7.6* 8.2*  MG  --   --  1.7  --   --    Liver Function Tests:  Recent Labs Lab 08/23/15 0240 08/23/15 1319 08/23/15 1810 08/24/15 0344  AST 31 32 40 60*  ALT 18 15* 18 24  ALKPHOS 50 350* 101 61  BILITOT 1.5* 1.7* 1.9* 1.7*  PROT 5.8* 4.7* 4.9* 4.8*  ALBUMIN 3.1* 2.0* 2.5* 2.5*    Recent Labs Lab 08/23/15 0240  LIPASE 38    No results for input(s): AMMONIA in the last 168 hours. CBC:  Recent Labs Lab 08/23/15 0240 08/23/15 1319 08/23/15 1910 08/24/15 0344 08/25/15 0402 08/26/15 0333  WBC 19.8* 7.6 37.0* 57.5* 29.6* 9.3  NEUTROABS 18.6* 7.4  --   --   --   --   HGB 12.0* 9.7* 10.7* 10.6* 10.6* 11.1*  HCT 36.7* 30.0* 33.0* 32.1* 32.9* 35.4*  MCV 99.7 99.0 99.7 100.3* 99.7 102.3*  PLT 142* 99* 85* 93* 80* 78*   Cardiac Enzymes:  Recent Labs Lab 08/24/15 0344 08/24/15 0928 08/24/15 1458 08/24/15 2128 08/26/15 0333  TROPONINI 0.50* 0.46* 0.36* 0.33* 0.24*   CBG:  Recent Labs Lab 08/26/15 0810 08/26/15 2124 08/26/15 2316 08/27/15 0017 08/27/15 0815  GLUCAP 104* 71 64* 132* 83    If 7PM-7AM, please contact night-coverage [email or web address] Password [facility] 08/29/2015, 11:50 AM

## 2015-08-29 NOTE — Progress Notes (Signed)
Mr. Daisy is much unchanged from yesterday. Having apneic episodes. Discussed plan with daughter, Lelon Frohlich, at bedside and RN Rollene Fare. Full comfort care. Prognosis hours to days. No change in regimen for comfort. Utilize morphine as needed to achieve comfort. Please call [phone] for any acute palliative needs over the weekend.   Vinie Sill, NP Palliative Medicine Team Pager # [phone] (M-F 8a-5p) Team Phone # [phone] (Nights/Weekends)

## 2015-08-29 NOTE — Discharge Summary (Signed)
Physician Discharge Summary  [name] [id number] DOB: [birth year] DOA: 08/23/2015  PCP: Odette Fraction, MD  Admit date: 08/23/2015 Discharge date: 08/29/2015  Recommendations for Outpatient Follow-up:  Pt will under hospice care, pt not safe discharge   Discharge Diagnoses:  Principal Problem:   Sepsis due to Escherichia coli (E. coli) ([place]) Active Problems:   Abdominal pain, acute   SBO (small bowel obstruction) ([place])   NSTEMI (non-ST elevated myocardial infarction) ([place])   Acute renal failure (HCC)   Acute respiratory failure with hypoxia (HCC)   Hypocalcemia   Atrial fibrillation (HCC)   Thrombocytopenia (HCC)   Pacemaker-Medtronic   BPH (benign prostatic hyperplasia)   Palliative care encounter   DNR (do not resuscitate)   Normocytic anemia   Goals of care, counseling/discussion  Discharge Condition: not stable   Diet recommendation: NPO   Brief narrative:    82 y.o. male with PMH of chronic atrial fibrillation no longer on anticoagulation, tachy-brady syndrome status post pacer placement, and dementia who presented from his nursing home with complaints of constipation and abdominal pain. Patient was just discharged from this institution on 08/19/2015 following management of an SBO. He was discharged in improved and stable condition with a clear liquid diet. Per report patient has had increased confusion since the recent hospitalization, increasing abdominal distention, abdominal pain, and has not moved his bowels in the last 4-5 days despite an aggressive bowel regimen at his SNF with enemas, senna, MiraLAX, and attempts at manual disimpaction.   Assessment/Plan:    Principal Problem:  Severe sepsis with acute organ dysfunction/Septic shock (HCC) - full comfort now   Active Problems:  Abdominal pain, acute secondary to SBO (small bowel obstruction) (HCC) - with NGT in place, large amount of stool in colon - provide analgesia as needed, keep in  palliative care unit    NSTEMI (non-ST elevated myocardial infarction) (HCC) - peak troponin 0.58, trending down - pt is not candidate for further aggressive cardiac interventions - again, main focus is comfort    Acute renal failure (HCC) with metabolic acidosis  - secondary to acute illness outlined above - no further blood work to ensure comfort    Hypernatremia - no further blood work    Acute respiratory failure with hypoxia (HCC) - keep on oxygen for comfort    Hypocalcemia - supplemented    Atrial fibrillation ([place]) - CHADS 2 4-5 - pt determined not to be a candidate for [facility] due to high risk bleed    Thrombocytopenia (HCC) - stopped Heparin SQ and placed in SCD's    Pacemaker-Medtronic - monitor    BPH (benign prostatic hyperplasia) - foley in place, can remove to ensure comfort if family desires    Palliative care encounter - comfort will be provided  - pt unstable for transfer, in hospital death expected    DNR (do not resuscitate)   Normocytic anemia - stable for now   Functional quadriplegia - from multiple acute and chronic issues - bed bound    Severe PCM - in the context of acute and progressive illness, recurrent SBO - scopolamine patch to control secretions    Code Status: DNR Family Communication: plan of care discussed with the granddaughter at bedside Disposition Plan: full comfort   IV access:  Peripheral IV  Procedures and diagnostic studies:   Ct Abdomen Pelvis Wo Contrast 08/23/2015 Interval worsening in small bowel dilatation, measuring up to 4.9 cm in diameter, reflecting worsening relatively high-grade small bowel obstruction. Stomach also filled  with fluid and air. Worsening mesenteric edema at the right lower quadrant, with new engorgement of associated mesenteric nodes. This appears to reflect obstruction due to a complex knot created by the terminal ileum, through which the mid to distal ileum  extends. The terminal ileum also appears to twist on itself, with an apparent mesenteric band extending about the knot. This may reflect a complex configuration of small bowel volvulus, or possibly internal herniation through a postoperative mesenteric defect. 2. Small amount of ascites tracking about the liver and within the pelvis, increased from the prior study. Mild fluid tracks along the paracolic gutters bilaterally. 3. Bladder moderately distended and partially filled with air. This may reflect recent Foley catheter placement. Would correlate clinically. 4. Trace bilateral pleural effusions, with mild bibasilar atelectasis and a bleb at the left lung base. 5. Small left renal cyst noted. 6. Scattered calcification along the abdominal aorta and its branches. 7. Scattered diverticulosis along the entirety of the colon. The colon and rectum contain only a small amount of stool. Laxatives and enemas are not indicated, as the patient's symptoms reflect the underlying small bowel obstruction. 8. Enlarged prostate noted.   Dg Chest 1 View 08/23/2015 Enlarged cardiac silhouette without evidence of pulmonary edema. Chronic interstitial lung disease. Superior subluxation of bilateral femoral heads, left worse than right, probably chronic and related to rotator cuff injury. Correlation to clinical exam may be considered.   Dg Abd Portable 1v-small Bowel Obstruction Protocol-initial, 8 Hr Delay 08/23/2015 Air distended loops of small bowel compatible with small bowel obstruction. Follow-up recommended. A air distention of the stomach, new from prior study.   Dg Abd Portable 1v-small Bowel Protocol-position Verification 08/23/2015 Again noted gaseous distended small bowel loops within upper abdomen consistent with small bowel obstruction. NG tube coiled within stomach with tip in proximal stomach.   Medical Consultants:  Surgery  PCCM and Cardiology over the phone PCT  Other Consultants:   None  IAnti-Infectives:   Vancomycin 1/21 --> 1/23 Zosyn 1/21 --> 1/23 Ancef 1/23 --> 1/25   MAGICK-Hiliana Eilts, Rebecca Eaton, MD [facility] Pager [phone]      Discharge Exam: Filed Vitals:   08/28/15 2048 08/29/15 0501  BP:  119/52  Pulse: 98 99  Temp:  98.4 F (36.9 C)  Resp:  7   Filed Vitals:   08/28/15 0753 08/28/15 0800 08/28/15 2048 08/29/15 0501  BP: 146/86   119/52  Pulse: 98  98 99  Temp:  97.8 F (36.6 C)  98.4 F (36.9 C)  TempSrc:  Axillary  Axillary  Resp: 6   7  Height:      Weight:      SpO2:    100%    General: Pt is sleeping, NAD  Discharge Instructions  Discharge Instructions    Diet - low sodium heart healthy    Complete by:  As directed      Increase activity slowly    Complete by:  As directed             Medication List    STOP taking these medications        aspirin EC 81 MG tablet     digoxin 0.125 MG tablet  Commonly known as:  LANOXIN     FLOMAX 0.4 MG Caps capsule  Generic drug:  tamsulosin     metoprolol succinate 25 MG 24 hr tablet  Commonly known as:  TOPROL-XL     mineral oil enema     pantoprazole 40 MG tablet  Commonly known as:  PROTONIX     polyethylene glycol packet  Commonly known as:  MIRALAX / GLYCOLAX     senna-docusate 8.6-50 MG tablet  Commonly known as:  Senokot-S      TAKE these medications        acetaminophen 325 MG tablet  Commonly known as:  TYLENOL  Take 650 mg by mouth See admin instructions. Take 2 tablets (650 mg) 2 times daily, may take 2 more tablets mid-day as needed for pain     glycopyrrolate 0.2 MG/ML injection  Commonly known as:  ROBINUL  Inject 1 mL (0.2 mg total) into the vein every 4 (four) hours as needed (secretions, gurgling).     LORazepam 2 MG/ML injection  Commonly known as:  ATIVAN  Inject 0.5-1 mLs (1-2 mg total) into the vein every 4 (four) hours as needed for anxiety.     loteprednol 0.5 % ophthalmic suspension  Commonly known as:  LOTEMAX  Place 1 drop  into both eyes daily as needed (itching eyes).     metoprolol 1 MG/ML injection  Commonly known as:  LOPRESSOR  Inject 5 mLs (5 mg total) into the vein every 6 (six) hours as needed (for HR > 110).     morphine 2 MG/ML injection  Inject 1-2 mLs (2-4 mg total) into the vein every hour as needed.     ondansetron 4 MG/2ML Soln injection  Commonly known as:  ZOFRAN  Inject 2 mLs (4 mg total) into the vein every 6 (six) hours as needed for nausea.          The results of significant diagnostics from this hospitalization (including imaging, microbiology, ancillary and laboratory) are listed below for reference.     Microbiology: Recent Results (from the past 240 hour(s))  Urine culture     Status: None   Collection Time: 08/23/15  9:57 AM  Result Value Ref Range Status   Specimen Description URINE, CLEAN CATCH  Final   Special Requests NONE  Final   Culture   Final    >=100,000 COLONIES/mL ESCHERICHIA COLI Performed at [facility]    Report Status 08/25/2015 FINAL  Final   Organism ID, Bacteria ESCHERICHIA COLI  Final      Susceptibility   Escherichia coli - MIC*    AMPICILLIN <=2 SENSITIVE Sensitive     CEFAZOLIN <=4 SENSITIVE Sensitive     CEFTRIAXONE <=1 SENSITIVE Sensitive     CIPROFLOXACIN <=0.25 SENSITIVE Sensitive     GENTAMICIN <=1 SENSITIVE Sensitive     IMIPENEM <=0.25 SENSITIVE Sensitive     NITROFURANTOIN 32 SENSITIVE Sensitive     TRIMETH/SULFA <=20 SENSITIVE Sensitive     AMPICILLIN/SULBACTAM <=2 SENSITIVE Sensitive     PIP/TAZO <=4 SENSITIVE Sensitive     * >=100,000 COLONIES/mL ESCHERICHIA COLI  MRSA PCR Screening     Status: Abnormal   Collection Time: 08/23/15  7:29 PM  Result Value Ref Range Status   MRSA by PCR POSITIVE (A) NEGATIVE Final    Comment:        The GeneXpert MRSA Assay (FDA approved for NASAL specimens only), is one component of a comprehensive MRSA colonization surveillance program. It is not intended to diagnose  MRSA infection nor to guide or monitor treatment for MRSA infections. RESULT CALLED TO, READ BACK BY AND VERIFIED WITH: J KAAT RN @ [phone] ON 08/23/15 BBY C DAVIS      Labs: Basic Metabolic Panel:  Recent Labs Lab 08/23/15 1319 08/23/15 1810  08/24/15 0344 08/25/15 0402 08/26/15 0333  NA 148* 142 143 145 149*  K 3.5 4.0 4.2 3.7 3.4*  CL 103 108 109 113* 116*  CO2 19* 21* 20* 21* 22  GLUCOSE 101* 68 83 84 74  BUN 32* 40* 46* 53* 49*  CREATININE 1.37* 1.94* 2.18* 1.90* 1.69*  CALCIUM 5.8* 7.5* 7.7* 7.6* 8.2*  MG  --   --  1.7  --   --    Liver Function Tests:  Recent Labs Lab 08/23/15 0240 08/23/15 1319 08/23/15 1810 08/24/15 0344  AST 31 32 40 60*  ALT 18 15* 18 24  ALKPHOS 50 350* 101 61  BILITOT 1.5* 1.7* 1.9* 1.7*  PROT 5.8* 4.7* 4.9* 4.8*  ALBUMIN 3.1* 2.0* 2.5* 2.5*    Recent Labs Lab 08/23/15 0240  LIPASE 38   No results for input(s): AMMONIA in the last 168 hours. CBC:  Recent Labs Lab 08/23/15 0240 08/23/15 1319 08/23/15 1910 08/24/15 0344 08/25/15 0402 08/26/15 0333  WBC 19.8* 7.6 37.0* 57.5* 29.6* 9.3  NEUTROABS 18.6* 7.4  --   --   --   --   HGB 12.0* 9.7* 10.7* 10.6* 10.6* 11.1*  HCT 36.7* 30.0* 33.0* 32.1* 32.9* 35.4*  MCV 99.7 99.0 99.7 100.3* 99.7 102.3*  PLT 142* 99* 85* 93* 80* 78*   Cardiac Enzymes:  Recent Labs Lab 08/24/15 0344 08/24/15 0928 08/24/15 1458 08/24/15 2128 08/26/15 0333  TROPONINI 0.50* 0.46* 0.36* 0.33* 0.24*    CBG:  Recent Labs Lab 08/26/15 0810 08/26/15 2124 08/26/15 2316 08/27/15 0017 08/27/15 0815  GLUCAP 104* 71 64* 132* 83     SIGNED: Time coordinating discharge: 30 minutes  MAGICK-Tolulope Pinkett, MD  [facility] 08/29/2015, 11:46 AM Pager [phone]  If 7PM-7AM, please contact night-coverage [email or web address] Password [email or web address]

## 2015-08-29 NOTE — Progress Notes (Signed)
CSW continuing to follow.  CSW received notification that pt approved for GIP through [facility] [place]. [facility] [place] completed admission paperwork for GIP with pt daughter this morning.  MD aware and MD will discharge and readmit pt under GIP status.   No further social work needs identified at this time.   [facility] staff to follow for support.   CSW signing off.   Alison Murray, MSW, [place] Work [phone]

## 2015-08-29 NOTE — Progress Notes (Signed)
Inpatient [place] [id number] [facility] RN Visit L.Strandberg. RN   Request rec'd from [place], [place] for evaluation for GIP level of care elligibility; per attending Dr. Doyle Askew prognosis hours to days, seen by PMT who is in agreement. Patient seen at bedside at 230 pm on 08/27/15 and  patient information reviewed with [facility] Direagnosis Small Bowel Obstruction (K56.60). Patient seen today at 10 am and pt. Is unresponsive to touch. Pt's daughter at the bedside and reports pt.was the same yesterday. Pt. On O2 at [facility] and respirations regular and unlabored. Ng to suction draining green liquid. Comfort medications in place and pt. receiving Morphine, Zofran and Lorazepam IV prn. Staff nurse Rollene Fare monitoring pt. closely for effectiveness of sx management. Daughter Lelon Frohlich aware [facility] SW Erling Conte will follow up to complete registration visit, and that the [facility] team will continue to follow pt./family daily along with Dr. Doyle Askew. Please contact [facility] at [phone] for any hospice needs.  Please contact Dr. Doyle Askew (attending ) for sx management needs and at time of death.   [facility] Liaison [phone]

## 2015-08-30 DIAGNOSIS — E870 Hyperosmolality and hypernatremia: Secondary | ICD-10-CM

## 2015-08-30 DIAGNOSIS — I482 Chronic atrial fibrillation: Secondary | ICD-10-CM

## 2015-08-30 DIAGNOSIS — J9601 Acute respiratory failure with hypoxia: Secondary | ICD-10-CM

## 2015-08-30 DIAGNOSIS — R532 Functional quadriplegia: Secondary | ICD-10-CM

## 2015-08-30 NOTE — Progress Notes (Addendum)
Inpatient [place] [facility] RN Visit. GIP related admission to [facility] DX: SBO (K56.60) pt. Is a DNR code status. Pt. Seen in room sleeping. His daughter is at the bedside. Resps regular with occasional brief apnea. Patient awakens enough to move arms and tries to talk. Staff RN Bertram Millard advised of need for pain medicine. Extremities warm to the touch. Oral mucosa dry and daughter using vaseline to his lips. Pt. Has had a Scopolamine patch placed yesterday and has had 3 doses of IV Morphine in the last 24 hours. Patient spends most of his time sleeping and does not awaken to his name.  Please call with any questions. [facility] team will follow daily in addition to Dr. Doyle Askew.  Barbra Sarks RN [facility] hospital Liaison [phone]  Addendum: Pt's daughter advised this liaison that [employer] will be the funeral home that the family will use and that pt. Is an organ donor. Staff member advised of this information.

## 2015-08-30 NOTE — Progress Notes (Signed)
Patient ID: LASTER ELLENA, male   DOB: 08-04-1922, 82 y.o.   MRN: [id number] [facility] PROGRESS NOTE  MAHENDER SLIDER [id number] DOB: July 03, 1923 DOA: 08/29/2015 PCP: Odette Fraction, MD  Brief narrative:    82 y.o. male with past medical history of chronic atrial fibrillation no longer on anticoagulation, tachy-brady syndrome status post pacer placement, dementia who presented from nursing home with complaints of constipation and abdominal pain. Despite aggressive measure to get him to have BM he still has not had any. PCT consulted for goals of care and plan for comfort care.    Assessment/Plan:    Principal Problem:  Acute abdominal pain secondary to SBO (small bowel obstruction) ([place]) - Comfort care - No further blood work to ensure comfort - Continue pain management efforts   Active Problems:  Acute renal failure (HCC) with metabolic acidosis  - No further blood work to ensure comfort    Hypernatremia - On admission - No further blood work to ensure comfort care    Acute respiratory failure with hypoxia ([place]) - Likely related to aspiration - Morphine prn for work of breathing    Atrial fibrillation ([place]) - CHADS 2 4-5 - Not on AC - Comfort care    Functional quadriplegia - Due to chronic illness   Severe PCM - Comfort feeds only    DVT Prophylaxis  - Comfort care    Code Status: DNR/DNI Family Communication:  Family not at the bedside  Disposition Plan: Comfort care   IV access:  Peripheral IV  Procedures and diagnostic studies:    Ct Abdomen Pelvis Wo Contrast 08/23/2015 Interval worsening in small bowel dilatation, measuring up to 4.9 cm in diameter, reflecting worsening relatively high-grade small bowel obstruction. Stomach also filled with fluid and air. Worsening mesenteric edema at the right lower quadrant, with new engorgement of associated mesenteric nodes. This appears to reflect obstruction due to a complex knot created by  the terminal ileum, through which the mid to distal ileum extends. The terminal ileum also appears to twist on itself, with an apparent mesenteric band extending about the knot. This may reflect a complex configuration of small bowel volvulus, or possibly internal herniation through a postoperative mesenteric defect. 2. Small amount of ascites tracking about the liver and within the pelvis, increased from the prior study. Mild fluid tracks along the paracolic gutters bilaterally. 3. Bladder moderately distended and partially filled with air. This may reflect recent Foley catheter placement. Would correlate clinically. 4. Trace bilateral pleural effusions, with mild bibasilar atelectasis and a bleb at the left lung base. 5. Small left renal cyst noted. 6. Scattered calcification along the abdominal aorta and its branches. 7. Scattered diverticulosis along the entirety of the colon. The colon and rectum contain only a small amount of stool. Laxatives and enemas are not indicated, as the patient's symptoms reflect the underlying small bowel obstruction. 8. Enlarged prostate noted.   Dg Chest 1 View 08/23/2015 Enlarged cardiac silhouette without evidence of pulmonary edema. Chronic interstitial lung disease. Superior subluxation of bilateral femoral heads, left worse than right, probably chronic and related to rotator cuff injury. Correlation to clinical exam may be considered.   Dg Abd Portable 1v-small Bowel Obstruction Protocol-initial, 8 Hr Delay 08/23/2015 Air distended loops of small bowel compatible with small bowel obstruction. Follow-up recommended. A air distention of the stomach, new from prior study.   Dg Abd Portable 1v-small Bowel Protocol-position Verification 08/23/2015 Again noted gaseous distended small bowel loops within upper abdomen consistent with  small bowel obstruction. NG tube coiled within stomach with tip in proximal stomach.   Medical Consultants:  Palliative care  Other  Consultants:  None   IAnti-Infectives:   None    Leisa Lenz, MD  [facility] Pager [phone]  Time spent in minutes: 25 minutes  If 7PM-7AM, please contact night-coverage [email or web address] Password [facility] 08/30/2015, 11:17 AM   LOS: 1 day    HPI/Subjective: No acute overnight events.   Objective: There were no vitals filed for this visit.  Intake/Output Summary (Last 24 hours) at 08/30/15 1117 Last data filed at 08/30/15 0800  Gross per 24 hour  Intake     10 ml  Output    275 ml  Net   -265 ml    Exam:   General:  Pt is not in acute distress  Cardiovascular: Regular rate and rhythm, S1/S2 (+)  Respiratory: diminished, bilateral air entry  Abdomen: (+) BS, non tender   Extremities: No tenderness, palpable pulses   Neuro: Nonfocal  Data Reviewed: Basic Metabolic Panel:  Recent Labs Lab 08/23/15 1319 08/23/15 1810 08/24/15 0344 08/25/15 0402 08/26/15 0333  NA 148* 142 143 145 149*  K 3.5 4.0 4.2 3.7 3.4*  CL 103 108 109 113* 116*  CO2 19* 21* 20* 21* 22  GLUCOSE 101* 68 83 84 74  BUN 32* 40* 46* 53* 49*  CREATININE 1.37* 1.94* 2.18* 1.90* 1.69*  CALCIUM 5.8* 7.5* 7.7* 7.6* 8.2*  MG  --   --  1.7  --   --    Liver Function Tests:  Recent Labs Lab 08/23/15 1319 08/23/15 1810 08/24/15 0344  AST 32 40 60*  ALT 15* 18 24  ALKPHOS 350* 101 61  BILITOT 1.7* 1.9* 1.7*  PROT 4.7* 4.9* 4.8*  ALBUMIN 2.0* 2.5* 2.5*   No results for input(s): LIPASE, AMYLASE in the last 168 hours. No results for input(s): AMMONIA in the last 168 hours. CBC:  Recent Labs Lab 08/23/15 1319 08/23/15 1910 08/24/15 0344 08/25/15 0402 08/26/15 0333  WBC 7.6 37.0* 57.5* 29.6* 9.3  NEUTROABS 7.4  --   --   --   --   HGB 9.7* 10.7* 10.6* 10.6* 11.1*  HCT 30.0* 33.0* 32.1* 32.9* 35.4*  MCV 99.0 99.7 100.3* 99.7 102.3*  PLT 99* 85* 93* 80* 78*   Cardiac Enzymes:  Recent Labs Lab 08/24/15 0344 08/24/15 0928 08/24/15 1458 08/24/15 2128 08/26/15 0333   TROPONINI 0.50* 0.46* 0.36* 0.33* 0.24*   BNP: Invalid input(s): POCBNP CBG:  Recent Labs Lab 08/26/15 0810 08/26/15 2124 08/26/15 2316 08/27/15 0017 08/27/15 0815  GLUCAP 104* 71 64* 132* 83    Recent Results (from the past 240 hour(s))  Urine culture     Status: None   Collection Time: 08/23/15  9:57 AM  Result Value Ref Range Status   Specimen Description URINE, CLEAN CATCH  Final   Special Requests NONE  Final   Culture   Final    >=100,000 COLONIES/mL ESCHERICHIA COLI Performed at [facility]    Report Status 08/25/2015 FINAL  Final   Organism ID, Bacteria ESCHERICHIA COLI  Final      Susceptibility   Escherichia coli - MIC*    AMPICILLIN <=2 SENSITIVE Sensitive     CEFAZOLIN <=4 SENSITIVE Sensitive     CEFTRIAXONE <=1 SENSITIVE Sensitive     CIPROFLOXACIN <=0.25 SENSITIVE Sensitive     GENTAMICIN <=1 SENSITIVE Sensitive     IMIPENEM <=0.25 SENSITIVE Sensitive     NITROFURANTOIN 32  SENSITIVE Sensitive     TRIMETH/SULFA <=20 SENSITIVE Sensitive     AMPICILLIN/SULBACTAM <=2 SENSITIVE Sensitive     PIP/TAZO <=4 SENSITIVE Sensitive     * >=100,000 COLONIES/mL ESCHERICHIA COLI  MRSA PCR Screening     Status: Abnormal   Collection Time: 08/23/15  7:29 PM  Result Value Ref Range Status   MRSA by PCR POSITIVE (A) NEGATIVE Final    Comment:        The GeneXpert MRSA Assay (FDA approved for NASAL specimens only), is one component of a comprehensive MRSA colonization surveillance program. It is not intended to diagnose MRSA infection nor to guide or monitor treatment for MRSA infections. RESULT CALLED TO, READ BACK BY AND VERIFIED WITH: J KAAT RN @ [phone] ON 08/23/15 BBY C DAVIS      Scheduled Meds: . scopolamine  1 patch Transdermal Q72H

## 2015-08-31 DIAGNOSIS — K5669 Other intestinal obstruction: Secondary | ICD-10-CM

## 2015-08-31 DIAGNOSIS — E43 Unspecified severe protein-calorie malnutrition: Secondary | ICD-10-CM

## 2015-08-31 NOTE — Progress Notes (Signed)
MD contacted CSW to inform pt is now likely appropriate for residential hospice placement  CSW informed [facility] that pt is probably stable to transition to [facility]- they will follow and evaluate for admission pending pt status/bed availability  CSW will continue to follow  Domenica Reamer, [place] Worker [phone]

## 2015-08-31 NOTE — Progress Notes (Signed)
Nutrition Brief Note  Pt identified as at nutrition risk on the Malnutrition Screen Tool  Chart reviewed. Pt now transitioning to comfort care.  No further nutrition interventions warranted at this time.  Please consult as needed.   Hau Sanor, MS, RD, LDN Pager: [phone] After Hours Pager: [phone]  

## 2015-08-31 NOTE — Progress Notes (Signed)
Patient ID: [name], male   DOB: 03-Jan-1923, 82 y.o.   MRN: [id number] [facility] PROGRESS NOTE  [name] [id number] DOB: 1923-07-16 DOA: 08/29/2015 PCP: Odette Fraction, MD  Brief narrative:    83 y.o. male with past medical history of chronic atrial fibrillation no longer on anticoagulation, tachy-brady syndrome status post pacer placement, dementia who presented from nursing home with complaints of constipation and abdominal pain. Despite aggressive measure to get him to have BM he still has not had any. PCT consulted for goals of care and plan for comfort care.    Assessment/Plan:    Principal Problem:  Acute abdominal pain secondary to SBO (small bowel obstruction) ([place]) - Comfort care - Continue pain management efforts   Active Problems:  Acute renal failure (H[facility]) with metabolic acidosis  - No further blood work to ensure comfort    Hypernatremia - On admission - No further blood work to ensure comfort care    Acute respiratory failure with hypoxia ([place]) - Likely related to aspiration - Resp status stable    Atrial fibrillation ([place]) - CHADS 2 4-5 - Not on [facility] - Comfort care    Functional quadriplegia - Due to chronic illness   Severe PCM - May continue comfort feeds only    DVT Prophylaxis  - Comfort care    Code Status: DNR/DNI Family Communication:  Family not at the bedside  Disposition Plan: Comfort care   IV access:  Peripheral IV  Procedures and diagnostic studies:    Ct Abdomen Pelvis Wo Contrast 08/23/2015 Interval worsening in small bowel dilatation, measuring up to 4.9 cm in diameter, reflecting worsening relatively high-grade small bowel obstruction. Stomach also filled with fluid and air. Worsening mesenteric edema at the right lower quadrant, with new engorgement of associated mesenteric nodes. This appears to reflect obstruction due to a complex knot created by the terminal ileum, through which the mid to  distal ileum extends. The terminal ileum also appears to twist on itself, with an apparent mesenteric band extending about the knot. This may reflect a complex configuration of small bowel volvulus, or possibly internal herniation through a postoperative mesenteric defect. 2. Small amount of ascites tracking about the liver and within the pelvis, increased from the prior study. Mild fluid tracks along the paracolic gutters bilaterally. 3. Bladder moderately distended and partially filled with air. This may reflect recent Foley catheter placement. Would correlate clinically. 4. Trace bilateral pleural effusions, with mild bibasilar atelectasis and a bleb at the left lung base. 5. Small left renal cyst noted. 6. Scattered calcification along the abdominal aorta and its branches. 7. Scattered diverticulosis along the entirety of the colon. The colon and rectum contain only a small amount of stool. Laxatives and enemas are not indicated, as the patient's symptoms reflect the underlying small bowel obstruction. 8. Enlarged prostate noted.   Dg Chest 1 View 08/23/2015 Enlarged cardiac silhouette without evidence of pulmonary edema. Chronic interstitial lung disease. Superior subluxation of bilateral femoral heads, left worse than right, probably chronic and related to rotator cuff injury. Correlation to clinical exam may be considered.   Dg Abd Portable 1v-small Bowel Obstruction Protocol-initial, 8 Hr Delay 08/23/2015 Air distended loops of small bowel compatible with small bowel obstruction. Follow-up recommended. A air distention of the stomach, new from prior study.   Dg Abd Portable 1v-small Bowel Protocol-position Verification 08/23/2015 Again noted gaseous distended small bowel loops within upper abdomen consistent with small bowel obstruction. NG tube coiled within stomach with  tip in proximal stomach.   Medical Consultants:  Palliative care  Other Consultants:  None   IAnti-Infectives:   None     Leisa Lenz, MD  [facility] Pager [phone]  Time spent in minutes: 15 minutes  If 7PM-7AM, please contact night-coverage [email or web address] Password [facility] 08/31/2015, 12:44 PM   LOS: 2 days    HPI/Subjective: No acute overnight events. No agitation.   Objective: Filed Vitals:   08/31/15 0502  BP: 129/62  Pulse: 72  Temp: 98.1 F (36.7 C)  TempSrc: Axillary  Height: 5\' 10"  (1.778 m)  Weight: 135 lb 12.9 oz (61.6 kg)  SpO2: 100%    Intake/Output Summary (Last 24 hours) at 08/31/15 1244 Last data filed at 08/31/15 0549  Gross per 24 hour  Intake    100 ml  Output   2025 ml  Net  -1925 ml    Exam:   General:  No distress  Cardiovascular: (+) S1, S2, RRR  Respiratory: diminished, rhonchorous  Abdomen: non tender, (+) BS  Extremities: palpable pulses   Neuro: No focal deficits   Data Reviewed: Basic Metabolic Panel:  Recent Labs Lab 08/25/15 0402 08/26/15 0333  NA 145 149*  K 3.7 3.4*  CL 113* 116*  CO2 21* 22  GLUCOSE 84 74  BUN 53* 49*  CREATININE 1.90* 1.69*  CALCIUM 7.6* 8.2*   Liver Function Tests: No results for input(s): AST, ALT, ALKPHOS, BILITOT, PROT, ALBUMIN in the last 168 hours. No results for input(s): LIPASE, AMYLASE in the last 168 hours. No results for input(s): AMMONIA in the last 168 hours. CBC:  Recent Labs Lab 08/25/15 0402 08/26/15 0333  WBC 29.6* 9.3  HGB 10.6* 11.1*  HCT 32.9* 35.4*  MCV 99.7 102.3*  PLT 80* 78*   Cardiac Enzymes:  Recent Labs Lab 08/24/15 1458 08/24/15 2128 08/26/15 0333  TROPONINI 0.36* 0.33* 0.24*   BNP: Invalid input(s): POCBNP CBG:  Recent Labs Lab 08/26/15 0810 08/26/15 2124 08/26/15 2316 08/27/15 0017 08/27/15 0815  GLUCAP 104* 71 64* 132* 83    Recent Results (from the past 240 hour(s))  Urine culture     Status: None   Collection Time: 08/23/15  9:57 AM  Result Value Ref Range Status   Specimen Description URINE, CLEAN CATCH  Final   Special Requests  NONE  Final   Culture   Final    >=100,000 COLONIES/mL ESCHERICHIA COLI Performed at [facility]    Report Status 08/25/2015 FINAL  Final   Organism ID, Bacteria ESCHERICHIA COLI  Final      Susceptibility   Escherichia coli - MIC*    AMPICILLIN <=2 SENSITIVE Sensitive     CEFAZOLIN <=4 SENSITIVE Sensitive     CEFTRIAXONE <=1 SENSITIVE Sensitive     CIPROFLOXACIN <=0.25 SENSITIVE Sensitive     GENTAMICIN <=1 SENSITIVE Sensitive     IMIPENEM <=0.25 SENSITIVE Sensitive     NITROFURANTOIN 32 SENSITIVE Sensitive     TRIMETH/SULFA <=20 SENSITIVE Sensitive     AMPICILLIN/SULBACTAM <=2 SENSITIVE Sensitive     PIP/TAZO <=4 SENSITIVE Sensitive     * >=100,000 COLONIES/mL ESCHERICHIA COLI  MRSA PCR Screening     Status: Abnormal   Collection Time: 08/23/15  7:29 PM  Result Value Ref Range Status   MRSA by PCR POSITIVE (A) NEGATIVE Final    Comment:        The GeneXpert MRSA Assay (FDA approved for NASAL specimens only), is one component of a comprehensive MRSA colonization surveillance program. It  is not intended to diagnose MRSA infection nor to guide or monitor treatment for MRSA infections. RESULT CALLED TO, READ BACK BY AND VERIFIED WITH: J KAAT RN @ [phone] ON 08/23/15 BBY C DAVIS      Scheduled Meds: . scopolamine  1 patch Transdermal [id number]

## 2015-08-31 NOTE — Progress Notes (Signed)
Addendum Hospice Progress Notes 08/30/2015 9:30 AM    Expand All Collapse All   [facility] RN Visit. GIP related admission to [facility] DX: SBO (K56.60) pt. Is a DNR code status. Pt. Seen in room sleeping. Resps regular with occasional brief apnea. Patient awakens enough to move arms and tries to talk. Staff RN reports pt. seemed very uncomfortable when she came in today and gave him a dose of Morphine. Extremities warm to the touch. Oral mucosa dry and daughter using vaseline to his lips. Pt. Has had a Scopolamine patch placed 1/27 and has had 5 doses of IV Morphine in the last 24 hours. Patient spends most of his time sleeping. Pt's daughter Webb Silversmith was at the bedside and reports he has been opening his left eye frequently and then goes back to sleep.  Please call with any questions. [facility] team will follow daily in addition to Dr. Doyle Askew.  [facility] Liaison [phone]

## 2015-09-01 DIAGNOSIS — R109 Unspecified abdominal pain: Secondary | ICD-10-CM

## 2015-09-01 DIAGNOSIS — E870 Hyperosmolality and hypernatremia: Secondary | ICD-10-CM | POA: Insufficient documentation

## 2015-09-01 DIAGNOSIS — A4151 Sepsis due to Escherichia coli [E. coli]: Secondary | ICD-10-CM

## 2015-09-01 DIAGNOSIS — R52 Pain, unspecified: Secondary | ICD-10-CM

## 2015-09-01 DIAGNOSIS — A419 Sepsis, unspecified organism: Secondary | ICD-10-CM

## 2015-09-01 DIAGNOSIS — E43 Unspecified severe protein-calorie malnutrition: Secondary | ICD-10-CM | POA: Insufficient documentation

## 2015-09-01 DIAGNOSIS — J9600 Acute respiratory failure, unspecified whether with hypoxia or hypercapnia: Secondary | ICD-10-CM

## 2015-09-01 DIAGNOSIS — R100 Acute abdomen: Secondary | ICD-10-CM

## 2015-09-01 DIAGNOSIS — K5660 Unspecified intestinal obstruction: Principal | ICD-10-CM

## 2015-09-01 MED ORDER — SCOPOLAMINE 1 MG/3DAYS TD PT72: 1.0000 | MEDICATED_PATCH | TRANSDERMAL | Status: AC

## 2015-09-01 NOTE — Discharge Summary (Addendum)
Physician Discharge Summary  [name] [id number] DOB: 14-Mar-1923 DOA: 08/29/2015  PCP: Odette Fraction, MD  Admit date: 08/29/2015 Discharge date: 09/01/2015  Recommendations for Outpatient Follow-up:  1. May continue medications to ensure comfort  Discharge Diagnoses:  Active Problems:   Abdominal pain, acute   SBO (small bowel obstruction) (HCC)   NSTEMI (non-ST elevated myocardial infarction) ([place])   Acute renal failure (HCC)   Acute respiratory failure with hypoxia (HCC)   Hypocalcemia   Sepsis due to Escherichia coli (E. coli) ([place])    Discharge Condition: stable   Diet recommendation: as tolerated   History of present illness:  82 y.o. male with past medical history of chronic atrial fibrillation no longer on anticoagulation, tachy-brady syndrome status post pacer placement, dementia who presented from nursing home with complaints of constipation and abdominal pain. Despite aggressive measure to get him to have BM he still has not had any. PCT consulted for goals of care and plan for comfort care.   Hospital Course:  Assessment/Plan:    Principal Problem:  Acute abdominal pain secondary to SBO (small bowel obstruction) ([place]) - Comfort care - Continue pain management efforts   Active Problems:  Acute renal failure (HCC) with metabolic acidosis  - No further blood work to ensure comfort    Hypernatremia - On admission - No further blood work to ensure comfort care    Acute respiratory failure with hypoxia ([place]) - Likely related to aspiration - Resp status stable    Atrial fibrillation ([place]) - CHADS 2 4-5 - Not on [facility] - Comfort care    Functional quadriplegia - Due to chronic illness   Severe Protein Calorie Malnutrition  - May continue comfort feeds only    DVT Prophylaxis  - Comfort care    Code Status: DNR/DNI Family Communication: Patient's daughter at the bedside    IV access:  Peripheral IV  Procedures  and diagnostic studies:   Ct Abdomen Pelvis Wo Contrast 08/23/2015 Interval worsening in small bowel dilatation, measuring up to 4.9 cm in diameter, reflecting worsening relatively high-grade small bowel obstruction. Stomach also filled with fluid and air. Worsening mesenteric edema at the right lower quadrant, with new engorgement of associated mesenteric nodes. This appears to reflect obstruction due to a complex knot created by the terminal ileum, through which the mid to distal ileum extends. The terminal ileum also appears to twist on itself, with an apparent mesenteric band extending about the knot. This may reflect a complex configuration of small bowel volvulus, or possibly internal herniation through a postoperative mesenteric defect. 2. Small amount of ascites tracking about the liver and within the pelvis, increased from the prior study. Mild fluid tracks along the paracolic gutters bilaterally. 3. Bladder moderately distended and partially filled with air. This may reflect recent Foley catheter placement. Would correlate clinically. 4. Trace bilateral pleural effusions, with mild bibasilar atelectasis and a bleb at the left lung base. 5. Small left renal cyst noted. 6. Scattered calcification along the abdominal aorta and its branches. 7. Scattered diverticulosis along the entirety of the colon. The colon and rectum contain only a small amount of stool. Laxatives and enemas are not indicated, as the patient's symptoms reflect the underlying small bowel obstruction. 8. Enlarged prostate noted.   Dg Chest 1 View 08/23/2015 Enlarged cardiac silhouette without evidence of pulmonary edema. Chronic interstitial lung disease. Superior subluxation of bilateral femoral heads, left worse than right, probably chronic and related to rotator cuff injury. Correlation to clinical exam may  be considered.   Dg Abd Portable 1v-small Bowel Obstruction Protocol-initial, 8 Hr Delay 08/23/2015 Air distended loops of  small bowel compatible with small bowel obstruction. Follow-up recommended. A air distention of the stomach, new from prior study.   Dg Abd Portable 1v-small Bowel Protocol-position Verification 08/23/2015 Again noted gaseous distended small bowel loops within upper abdomen consistent with small bowel obstruction. NG tube coiled within stomach with tip in proximal stomach.   Medical Consultants:  Palliative care  Other Consultants:  None   IAnti-Infectives:   None     Signed:  Leisa Lenz, MD  [facility] 09/01/2015, 10:43 AM  Pager #: [phone]  Time spent in minutes: more than 30 minutes    Discharge Exam: Filed Vitals:   08/31/15 0502 09/01/15 0540  BP: 129/62 99/65  Pulse: 72 91  Temp: 98.1 F (36.7 C) 98.3 F (36.8 C)   Filed Vitals:   08/31/15 0502 09/01/15 0540  BP: 129/62 99/65  Pulse: 72 91  Temp: 98.1 F (36.7 C) 98.3 F (36.8 C)  TempSrc: Axillary Axillary  Height: 5\' 10"  (1.778 m)   Weight: 135 lb 12.9 oz (61.6 kg)   SpO2: 100% 90%    General: Pt is not in acute distress Cardiovascular: Rate controlled, S1/S2 + Respiratory: Bilateral air entry, gurgling (+) Abdominal: Soft, non tender, (+) BS Extremities: palpable pulses, no tenderness Neuro: Nonfocal  Discharge Instructions  Discharge Instructions    Call MD for:  difficulty breathing, headache or visual disturbances    Complete by:  As directed      Call MD for:  severe uncontrolled pain    Complete by:  As directed      Diet - low sodium heart healthy    Complete by:  As directed      Increase activity slowly    Complete by:  As directed             Medication List    STOP taking these medications        metoprolol 1 MG/ML injection  Commonly known as:  LOPRESSOR      TAKE these medications        acetaminophen 325 MG tablet  Commonly known as:  TYLENOL  Take 650 mg by mouth See admin instructions. Take 2 tablets (650 mg) 2 times daily, may take 2  more tablets mid-day as needed for pain     glycopyrrolate 0.2 MG/ML injection  Commonly known as:  ROBINUL  Inject 1 mL (0.2 mg total) into the vein every 4 (four) hours as needed (secretions, gurgling).     LORazepam 2 MG/ML injection  Commonly known as:  ATIVAN  Inject 0.5-1 mLs (1-2 mg total) into the vein every 4 (four) hours as needed for anxiety.     loteprednol 0.5 % ophthalmic suspension  Commonly known as:  LOTEMAX  Place 1 drop into both eyes daily as needed (itching eyes).     morphine 2 MG/ML injection  Inject 1-2 mLs (2-4 mg total) into the vein every hour as needed.     ondansetron 4 MG/2ML Soln injection  Commonly known as:  ZOFRAN  Inject 2 mLs (4 mg total) into the vein every 6 (six) hours as needed for nausea.     scopolamine 1 MG/3DAYS  Commonly known as:  TRANSDERM-SCOP  Place 1 patch (1.5 mg total) onto the skin every 3 (three) days.           Follow-up Information    Follow up  with Odette Fraction, MD.   Specialty:  Family Medicine   Why:  As needed   Contact information:   [address] [place] [address] [phone]        The results of significant diagnostics from this hospitalization (including imaging, microbiology, ancillary and laboratory) are listed below for reference.    Significant Diagnostic Studies: Ct Abdomen Pelvis Wo Contrast  08/23/2015  CLINICAL DATA:  Acute onset of generalized abdominal pain. Leukocytosis. Known small bowel obstruction. Initial encounter. EXAM: CT ABDOMEN AND PELVIS WITHOUT CONTRAST TECHNIQUE: Multidetector CT imaging of the abdomen and pelvis was performed following the standard protocol without IV contrast. COMPARISON:  CT the abdomen and pelvis performed 08/14/2015 FINDINGS: Trace bilateral pleural effusions are noted, with mild bibasilar atelectasis and a bleb at the left lung base. A small amount of ascites is noted tracking about the liver and within the pelvis, increased from the prior  study. Mild fluid is seen tracking along the paracolic gutters bilaterally. The extent of small bowel dilatation has worsened from the prior study, now measuring up to 4.9 cm in diameter. The stomach is also filled with fluid and air. There is worsened mesenteric edema at the right lower quadrant, with new engorgement of associated mesenteric nodes. On following the bowel, there appears to be a complex knot created by the terminal ileum, through which the mid to distal ileum extends. The mid to distal ileum obstructs as it passes through the knot. The terminal ileum also appears to twist on itself, with an apparent mesenteric band extending about the terminal ileal knot. This may reflect a complex configuration of small bowel volvulus. No typical internal hernia resides in this location, though internal herniation through a postoperative mesenteric defect might result in a similar appearance. The liver and spleen are unremarkable in appearance. The gallbladder is within normal limits. The pancreas and adrenal glands are unremarkable. A 1.8 cm cyst is noted at the interpole region of the left kidney. Mild left-sided perinephric fluid is noted. There is no evidence of hydronephrosis. No renal or ureteral stones are seen. No acute vascular abnormalities are seen. Scattered calcification is noted along the abdominal aorta and its branches. The appendix is normal in caliber, without evidence of appendicitis. A small amount of residual stool is noted within the colon. Scattered diverticulosis is noted along the entirety of the colon. The rectum contains only a small amount of stool. The bladder is moderately distended and partially filled with air. This may reflect recent Foley catheter placement. Would correlate clinically. The prostate is enlarged, measuring 5.5 cm in transverse dimension. No inguinal lymphadenopathy is seen. No acute osseous abnormalities are identified. The patient is status post decompression at L5.  Mild degenerative change is noted along the lower lumbar spine. IMPRESSION: 1. Interval worsening in small bowel dilatation, measuring up to 4.9 cm in diameter, reflecting worsening relatively high-grade small bowel obstruction. Stomach also filled with fluid and air. Worsening mesenteric edema at the right lower quadrant, with new engorgement of associated mesenteric nodes. This appears to reflect obstruction due to a complex knot created by the terminal ileum, through which the mid to distal ileum extends. The terminal ileum also appears to twist on itself, with an apparent mesenteric band extending about the knot. This may reflect a complex configuration of small bowel volvulus, or possibly internal herniation through a postoperative mesenteric defect. 2. Small amount of ascites tracking about the liver and within the pelvis, increased from the prior study. Mild  fluid tracks along the paracolic gutters bilaterally. 3. Bladder moderately distended and partially filled with air. This may reflect recent Foley catheter placement. Would correlate clinically. 4. Trace bilateral pleural effusions, with mild bibasilar atelectasis and a bleb at the left lung base. 5. Small left renal cyst noted. 6. Scattered calcification along the abdominal aorta and its branches. 7. Scattered diverticulosis along the entirety of the colon. The colon and rectum contain only a small amount of stool. Laxatives and enemas are not indicated, as the patient's symptoms reflect the underlying small bowel obstruction. 8. Enlarged prostate noted. These results were called by telephone at the time of interpretation on 08/23/2015 at 4:07 am to Dr. Delora Fuel, who verbally acknowledged these results. Electronically Signed   By: Garald Balding M.D.   On: 08/23/2015 04:23   Dg Chest 1 View  08/23/2015  CLINICAL DATA:  Sepsis.  History of AFib. EXAM: CHEST 1 VIEW COMPARISON:  01/29/2006 FINDINGS: Single lead cardiac pacemaker is stable. Enteric  catheter is seen terminating within the gastric cardia. The cardiac silhouette is enlarged. Mediastinal contours appear intact. There is no evidence of pleural effusion or pneumothorax. There are chronic interstitial lung changes without evidence of focal airspace consolidation. There is superior displacement of bilateral humeral heads in relation to the glenoids, left more than right, which may be seen chronic rotator cuff injury. Soft tissues are grossly normal. IMPRESSION: Enlarged cardiac silhouette without evidence of pulmonary edema. Chronic interstitial lung disease. Superior subluxation of bilateral femoral heads, left worse than right, probably chronic and related to rotator cuff injury. Correlation to clinical exam may be considered. Electronically Signed   By: Fidela Salisbury M.D.   On: 08/23/2015 17:28   Dg Abd 1 View  08/17/2015  CLINICAL DATA:  Abdominal pain and distention EXAM: ABDOMEN - 1 VIEW COMPARISON:  08/16/2015 FINDINGS: The LEFT lateral quadrant of the abdomen excluded. NG tube in stomach. No dilated loops of large or small bowel. Gas and the rectum. Moderate volume stool throughout the colon on the RIGHT colon. IMPRESSION: 1. No evidence of bowel obstruction. 2. Moderate to large volume stool. Electronically Signed   By: Suzy Bouchard M.D.   On: 08/17/2015 10:33   Ct Abdomen Pelvis W Contrast  08/14/2015  CLINICAL DATA:  82 year old male with generalized abdominal pain for 3 days. Nausea and vomiting. EXAM: CT ABDOMEN AND PELVIS WITH CONTRAST TECHNIQUE: Multidetector CT imaging of the abdomen and pelvis was performed using the standard protocol following bolus administration of intravenous contrast. CONTRAST:  65mL OMNIPAQUE IOHEXOL 300 MG/ML  SOLN COMPARISON:  CT 08/28/2012 FINDINGS: Lower chest: The included lung bases are clear. Pacemaker wires partially included. Heart upper limits normal in size. Liver: Unchanged tiny hypodensity in the right lobe, incompletely characterize  but likely cyst. Suspicious lesion. Hepatobiliary: Gallbladder physiologically distended. Calcified gallstones are seen. No biliary dilatation. Pancreas: Atrophic without ductal dilatation or surrounding inflammation. Spleen: Normal. Adrenal glands: No nodule. Kidneys: Symmetric renal enhancement. No hydronephrosis. There is a 1.9 cm simple cyst in the posterior interpolar right kidney. Trace of perinephric fluid is unchanged. Stomach/Bowel: Stomach markedly distended with fluid. Fluid in the distal esophagus. Small bowel loops are diffusely dilated and fluid-filled. Transition point in the right mid abdomen, best appreciated on coronal image 27. Small bowel loops distal to this are decompressed. There is adjacent mesenteric edema and small amount of free fluid. No pneumatosis. No free air. Small bowel loops in the right lower quadrant extend towards the right inguinal canal, unchanged in  appearance from prior exam. Moderate stool burden throughout the colon. Appendix tentatively identified and normal. Vascular/Lymphatic: No retroperitoneal adenopathy. Abdominal aorta is normal in caliber. Moderate atherosclerosis without aneurysm. Reproductive: Prostate gland prominent size. Bladder: Distended without wall thickening. Other: Small amount of free fluid in the pelvis is likely reactive. There is no free air. Probable previous right inguinal hernia repair. Musculoskeletal: Multilevel degenerative change throughout the lumbar spine There are no acute or suspicious osseous abnormalities. IMPRESSION: Small-bowel obstruction with transition point in the right mid abdomen. This is similar in location to prior exam, likely secondary to adhesions. Mesenteric edema and small amount of free fluid, no pneumatosis or perforation. Electronically Signed   By: Jeb Levering M.D.   On: 08/14/2015 20:40   Dg Abd 2 Views  08/19/2015  CLINICAL DATA:  Follow-up bowel obstruction EXAM: ABDOMEN - 2 VIEW COMPARISON:  August 17 2015  FINDINGS: Extensive bowel content is identified throughout colon. There are a few dilated small bowel loops in the mid abdomen. Scattered air is identified throughout colon. A nasogastric tube is identified distal tip in the stomach. Degenerative joint changes of the spine are identified. IMPRESSION: A few dilated small bowel loops in the mid abdomen with scattered air identified throughout colon. This could be due to resolving or partial small bowel obstruction. Extensive bowel content noted throughout colon. Electronically Signed   By: Abelardo Diesel M.D.   On: 08/19/2015 10:55   Dg Abd 2 Views  08/16/2015  CLINICAL DATA:  Abdominal pain.  Follow-up small bowel obstruction. EXAM: ABDOMEN - 2 VIEW COMPARISON:  CT, 08/14/2015. FINDINGS: Dilated small bowel is seen in the left upper quadrant. Maximum diameter is measured at 4.7 cm, which is magnified by the AP supine technique. There is stool seen within colon.  There is no colonic dilation. No free air is noted on the decubitus view. There are few scattered air-fluid levels. Nasogastric tube projects in the mid stomach. IMPRESSION: 1. Persistent dilated small bowel left upper quadrant. However, overall, the small bowel obstruction appears improved. There is no free air. Electronically Signed   By: Lajean Manes M.D.   On: 08/16/2015 10:39   Dg Abd Portable 1v-small Bowel Obstruction Protocol-initial, 8 Hr Delay  08/23/2015  CLINICAL DATA:  82 year old male with small-bowel obstruction. EXAM: PORTABLE ABDOMEN - 1 VIEW COMPARISON:  Radiograph dated 08/23/2015 FINDINGS: An enteric tube is noted in left upper abdomen in similar positioning. There are diffusely air distended loops of small bowel measuring up to 6 cm. An air distended structure in the upper abdomen is new from prior study and may represent an air distended stomach. There is moderate stool throughout the colon. No evidence of free air. Multiple gallstones noted. There is degenerative changes of the  spine and osseous structures. IMPRESSION: Air distended loops of small bowel compatible with small bowel obstruction. Follow-up recommended. A air distention of the stomach, new from prior study. Electronically Signed   By: Anner Crete M.D.   On: 08/23/2015 23:11   Dg Abd Portable 1v-small Bowel Protocol-position Verification  08/23/2015  CLINICAL DATA:  Follow-up small bowel obstruction, NG tube placement EXAM: PORTABLE ABDOMEN - 1 VIEW COMPARISON:  08/23/2015 FINDINGS: Again noted distended small bowel loops in mid upper abdomen and left abdomen. Largest loop measures up to 6.3 cm in diameter. There is NG-tube coiled within stomach with tip and proximal stomach. IMPRESSION: Again noted gaseous distended small bowel loops within upper abdomen consistent with small bowel obstruction. NG tube coiled within stomach  with tip in proximal stomach. Electronically Signed   By: Lahoma Crocker M.D.   On: 08/23/2015 14:07    Microbiology: Recent Results (from the past 240 hour(s))  Urine culture     Status: None   Collection Time: 08/23/15  9:57 AM  Result Value Ref Range Status   Specimen Description URINE, CLEAN CATCH  Final   Special Requests NONE  Final   Culture   Final    >=100,000 COLONIES/mL ESCHERICHIA COLI Performed at [facility]    Report Status 08/25/2015 FINAL  Final   Organism ID, Bacteria ESCHERICHIA COLI  Final      Susceptibility   Escherichia coli - MIC*    AMPICILLIN <=2 SENSITIVE Sensitive     CEFAZOLIN <=4 SENSITIVE Sensitive     CEFTRIAXONE <=1 SENSITIVE Sensitive     CIPROFLOXACIN <=0.25 SENSITIVE Sensitive     GENTAMICIN <=1 SENSITIVE Sensitive     IMIPENEM <=0.25 SENSITIVE Sensitive     NITROFURANTOIN 32 SENSITIVE Sensitive     TRIMETH/SULFA <=20 SENSITIVE Sensitive     AMPICILLIN/SULBACTAM <=2 SENSITIVE Sensitive     PIP/TAZO <=4 SENSITIVE Sensitive     * >=100,000 COLONIES/mL ESCHERICHIA COLI  MRSA PCR Screening     Status: Abnormal   Collection Time:  08/23/15  7:29 PM  Result Value Ref Range Status   MRSA by PCR POSITIVE (A) NEGATIVE Final    Comment:        The GeneXpert MRSA Assay (FDA approved for NASAL specimens only), is one component of a comprehensive MRSA colonization surveillance program. It is not intended to diagnose MRSA infection nor to guide or monitor treatment for MRSA infections. RESULT CALLED TO, READ BACK BY AND VERIFIED WITH: J KAAT RN @ [phone] ON 08/23/15 BBY C DAVIS      Labs: Basic Metabolic Panel:  Recent Labs Lab 08/26/15 0333  NA 149*  K 3.4*  CL 116*  CO2 22  GLUCOSE 74  BUN 49*  CREATININE 1.69*  CALCIUM 8.2*   Liver Function Tests: No results for input(s): AST, ALT, ALKPHOS, BILITOT, PROT, ALBUMIN in the last 168 hours. No results for input(s): LIPASE, AMYLASE in the last 168 hours. No results for input(s): AMMONIA in the last 168 hours. CBC:  Recent Labs Lab 08/26/15 0333  WBC 9.3  HGB 11.1*  HCT 35.4*  MCV 102.3*  PLT 78*   Cardiac Enzymes:  Recent Labs Lab 08/26/15 0333  TROPONINI 0.24*   BNP: BNP (last 3 results) No results for input(s): BNP in the last 8760 hours.  ProBNP (last 3 results) No results for input(s): PROBNP in the last 8760 hours.  CBG:  Recent Labs Lab 08/26/15 0810 08/26/15 2124 08/26/15 2316 08/27/15 0017 08/27/15 0815  GLUCAP 104* 71 64* 132* 83

## 2015-09-01 NOTE — Progress Notes (Signed)
Report called to [facility], spoke with Clarene Critchley. NG flushed with 30 ml NS and clamped for discharge. Pt transferred to [facility] by ambulance.

## 2015-09-01 NOTE — Discharge Instructions (Signed)
Scopolamine skin patches What is this medicine? SCOPOLAMINE (skoe POL a meen) is used to prevent nausea and vomiting caused by motion sickness, anesthesia and surgery. This medicine may be used for other purposes; ask your health care provider or pharmacist if you have questions. What should I tell my health care provider before I take this medicine? They need to know if you have any of these conditions: -glaucoma -kidney or liver disease -an unusual or allergic reaction (especially skin allergy) to scopolamine, atropine, other medicines, foods, dyes, or preservatives -pregnant or trying to get pregnant -breast-feeding How should I use this medicine? This medicine is for external use only. Follow the directions on the prescription label. One patch contains enough medicine to prevent motion sickness for up to 3 days. Apply the patch at least 4 hours before you need it and only wear one disc at a time. Choose an area behind the ear, that is clean, dry, hairless and free from any cuts or irritation. Wipe the area with a clean dry tissue. Peel off the plastic backing of the skin patch, trying not to touch the adhesive side with your hands. Do not cut the patches. Firmly apply to the area you have chosen, with the metallic side of the patch to the skin and the tan-colored side showing. Once firmly in place, wash your hands well with soap and water. Remove the disc after 3 days, or sooner if you no longer need it. After removing the patch, wash your hands and the area behind your ear thoroughly with soap and water. The patch will still contain some medicine after use. To avoid accidental contact or ingestion by children or pets, fold the used patch in half with the sticky side together and throw away in the trash out of the reach of children and pets. If you need to use a second patch after you remove the first, place it behind the other ear. Talk to your pediatrician regarding the use of this medicine in  children. Special care may be needed. Overdosage: If you think you have taken too much of this medicine contact a poison control center or emergency room at once. NOTE: This medicine is only for you. Do not share this medicine with others. What if I miss a dose? Make sure you apply the patch at least 4 hours before you need it. You can apply it the night before traveling. What may interact with this medicine? -benztropine -bethanechol -medicines for anxiety or sleeping problems like diazepam or temazepam -medicines for hay fever and other allergies -medicines for mental depression -muscle relaxants This list may not describe all possible interactions. Give your health care provider a list of all the medicines, herbs, non-prescription drugs, or dietary supplements you use. Also tell them if you smoke, drink alcohol, or use illegal drugs. Some items may interact with your medicine. What should I watch for while using this medicine? Keep the patch dry, if possible, to prevent it from falling off. Limited contact with water, however, as in bathing or swimming, will not affect the system. If the patch falls off, throw it away and put a new one behind the other ear. You may get drowsy or dizzy. Do not drive, use machinery, or do anything that needs mental alertness until you know how this medicine affects you. Do not stand or sit up quickly, especially if you are an older patient. This reduces the risk of dizzy or fainting spells. Alcohol may interfere with the effect of this  medicine. Avoid alcoholic drinks. Your mouth may get dry. Chewing sugarless gum or sucking hard candy, and drinking plenty of water may help. Contact your doctor if the problem does not go away or is severe. This medicine may cause dry eyes and blurred vision. If you wear contact lenses you may feel some discomfort. Lubricating drops may help. See your eye doctor if the problem does not go away or is severe. If you are going to have  a magnetic resonance imaging (MRI) procedure, tell your MRI technician if you have this patch on your body. It must be removed before a MRI. What side effects may I notice from receiving this medicine? Side effects that you should report to your doctor or health care professional as soon as possible: -agitation, nervousness, confusion -blurred vision and other eye problems -dizziness, drowsiness -eye pain or redness in the whites of the eye -hallucinations -pain or difficulty passing urine -skin rash, itching -vomiting Side effects that usually do not require medical attention (report to your doctor or health care professional if they continue or are bothersome): -headache -nausea This list may not describe all possible side effects. Call your doctor for medical advice about side effects. You may report side effects to FDA at 1-800-FDA-1088. Where should I keep my medicine? Keep out of the reach of children. Store at room temperature between 20 and 25 degrees C (68 and 77 degrees F). Throw away any unused medicine after the expiration date. When you remove a patch, fold it and throw it in the trash as described above. NOTE: This sheet is a summary. It may not cover all possible information. If you have questions about this medicine, talk to your doctor, pharmacist, or health care provider.    2016, [employer]. (2011-12-16 13:31:48)

## 2015-09-01 NOTE — Progress Notes (Signed)
CSW received notification that MD feels pt now stable to transfer to [facility]. CSW received notification from Tensed, Erling Conte that bed available at [facility] today.   CSW facilitated pt discharge needs including contacting facility, faxing pt discharge information to [facility], discussing with pt daughter at bedside, and arranging ambulance transport for pt to [facility].  No further social work needs identified at this time.  CSW signing off.   Alison Murray, MSW, [place] [facility] [phone]

## 2015-09-14 ENCOUNTER — Encounter: Payer: Self-pay | Admitting: Family Medicine

## 2015-10-01 DEATH — deceased

## 2016-09-21 IMAGING — CR DG LUMBAR SPINE COMPLETE 4+V
5 series · 5 of 5 positions shown · non-contrast
Comparison: 11/12/2013

CLINICAL DATA: Low back pain radiating to right side of sacrum and
right hip. No known injury. Pain for several days.

EXAM:
LUMBAR SPINE - COMPLETE 4+ VIEW

[t l-spine a.p.]
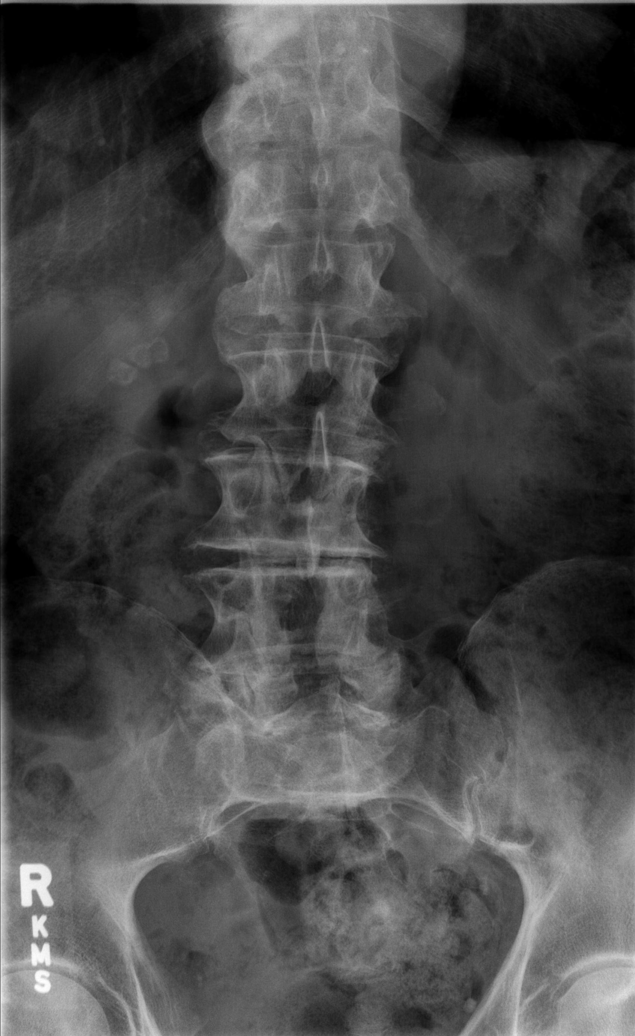

[t l-spine oblique exposure (1 of 2)]
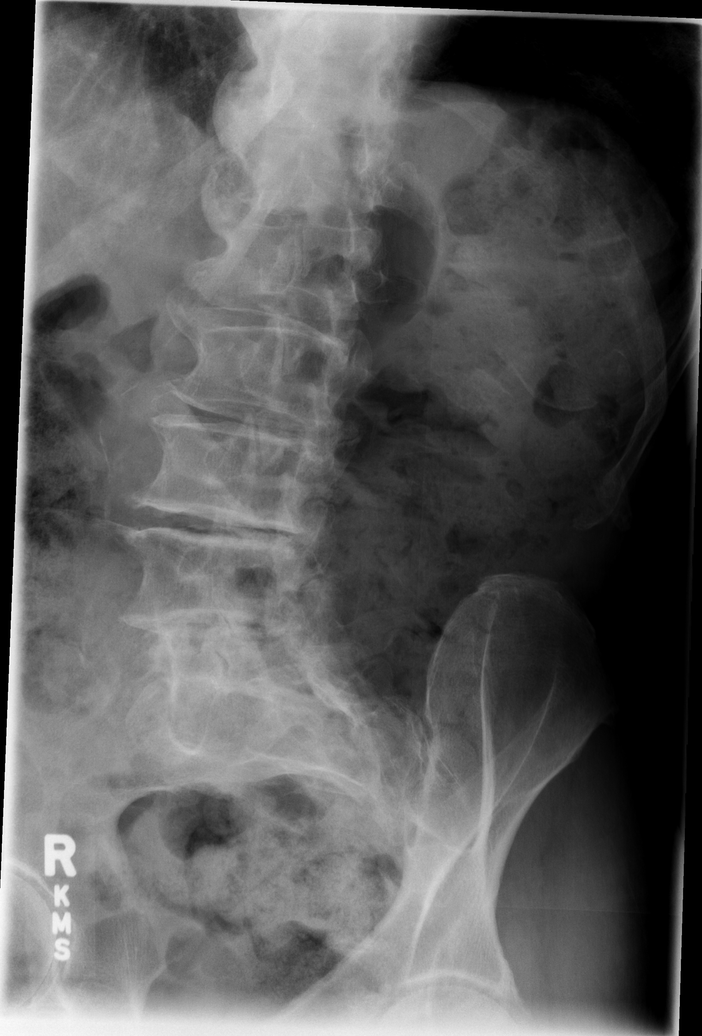

[t l-spine oblique exposure (2 of 2)]
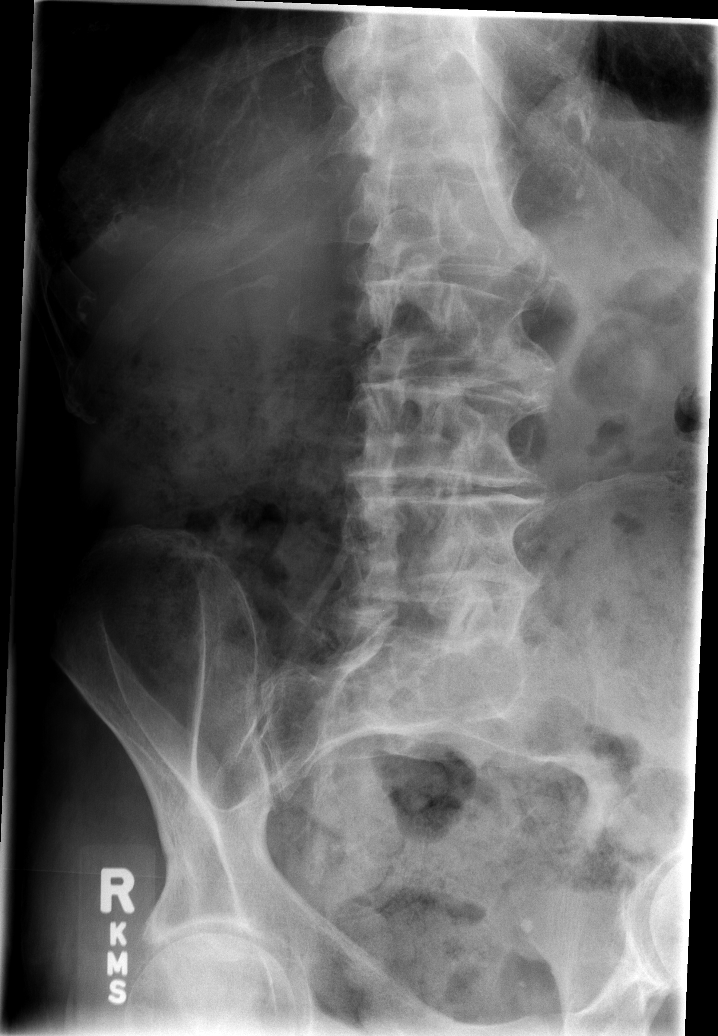

[t l-spine lat]
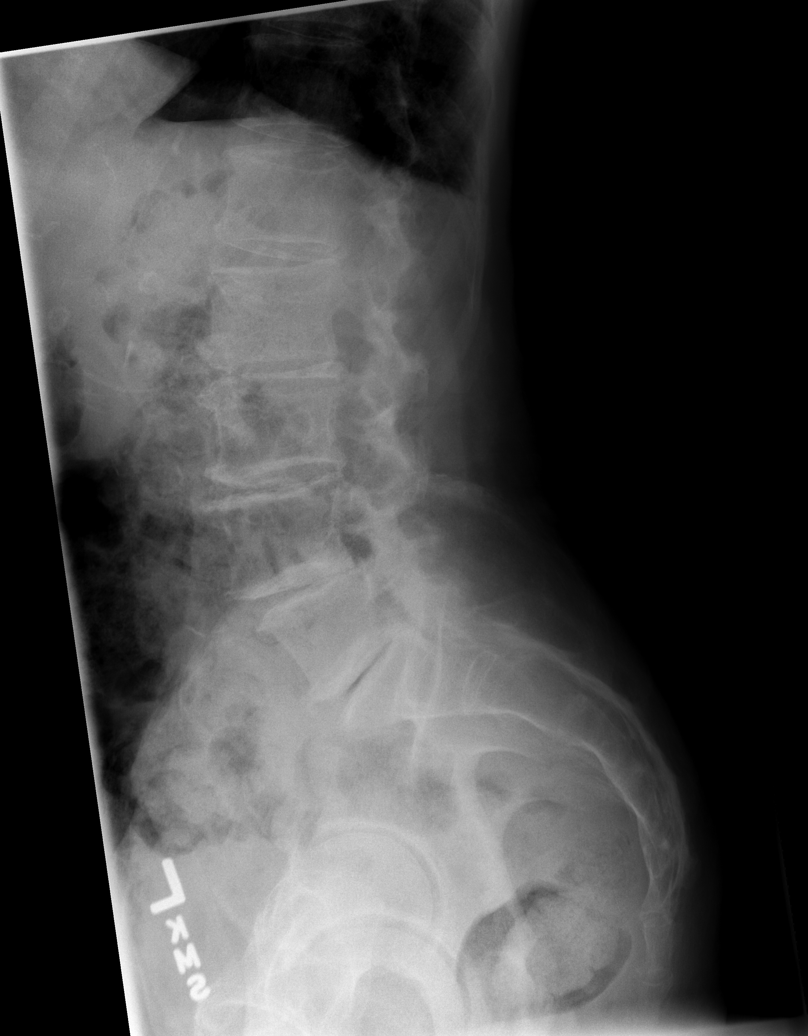

[t l-spine l5-s1 spot]
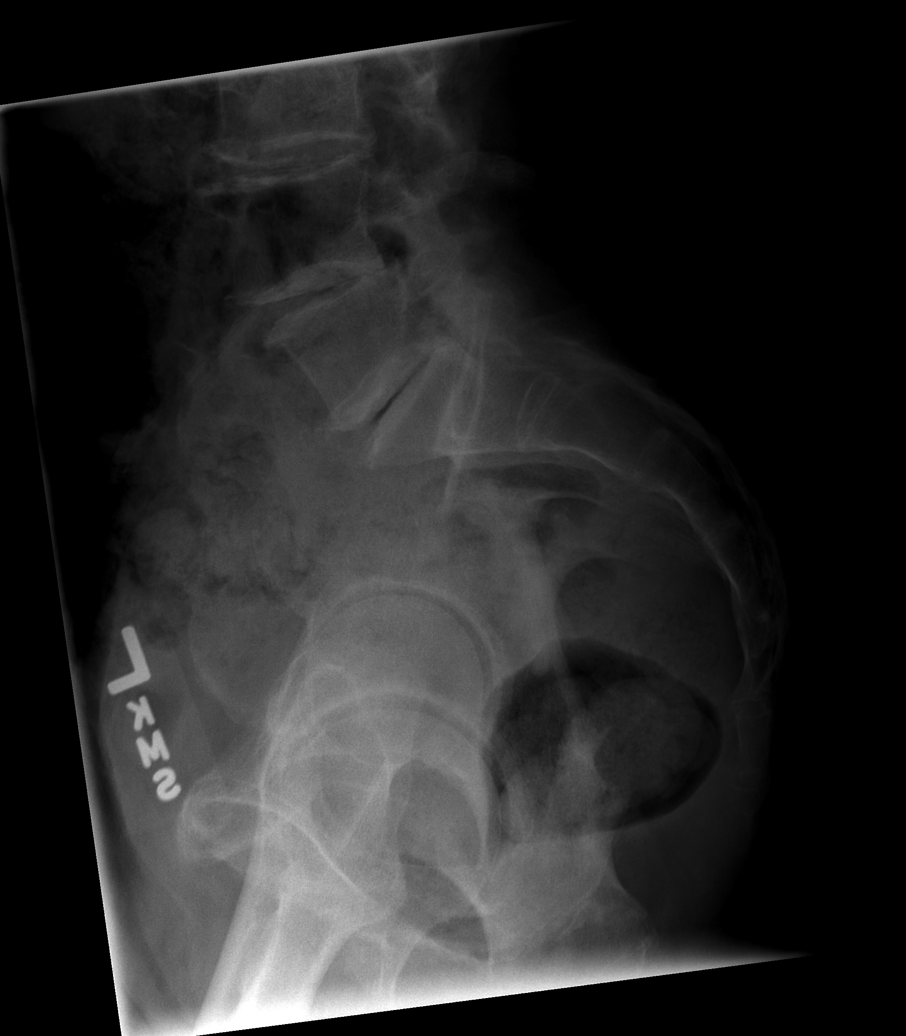

[5 of 5 positions shown; findings below may reference images not displayed]

FINDINGS: Advanced diffuse degenerative disc disease and facet disease
throughout the lumbar spine. Slight retrolisthesis of L3 on L4 and
slight anterolisthesis of L4 on L5, stable. No fracture. SI joints
are symmetric and unremarkable.
IMPRESSION: Advanced diffuse degenerative disc and facet disease as above. No
acute findings.
